# Patient Record
Sex: Male | Born: 1940 | Race: Black or African American | Hispanic: No | Marital: Single | Smoking: Current every day smoker
Health system: Southern US, Community
[De-identification: ages and names within clinical notes are randomized; demographics above are authoritative.]

## PROBLEM LIST (undated history)

## (undated) DIAGNOSIS — C801 Malignant (primary) neoplasm, unspecified: Secondary | ICD-10-CM

## (undated) DIAGNOSIS — I10 Essential (primary) hypertension: Secondary | ICD-10-CM

## (undated) DIAGNOSIS — C3490 Malignant neoplasm of unspecified part of unspecified bronchus or lung: Secondary | ICD-10-CM

## (undated) DIAGNOSIS — Z923 Personal history of irradiation: Secondary | ICD-10-CM

---

## 2001-07-01 ENCOUNTER — Emergency Department (HOSPITAL_COMMUNITY): Admission: EM | Admit: 2001-07-01 | Discharge: 2001-07-02 | Payer: Self-pay | Admitting: Emergency Medicine

## 2001-07-01 ENCOUNTER — Encounter: Payer: Self-pay | Admitting: Emergency Medicine

## 2001-07-12 ENCOUNTER — Encounter: Admission: RE | Admit: 2001-07-12 | Discharge: 2001-07-12 | Payer: Self-pay | Admitting: Internal Medicine

## 2001-07-12 ENCOUNTER — Ambulatory Visit (HOSPITAL_COMMUNITY): Admission: RE | Admit: 2001-07-12 | Discharge: 2001-07-12 | Payer: Self-pay | Admitting: Internal Medicine

## 2004-01-01 ENCOUNTER — Emergency Department (HOSPITAL_COMMUNITY): Admission: EM | Admit: 2004-01-01 | Discharge: 2004-01-01 | Payer: Self-pay | Admitting: Emergency Medicine

## 2005-02-03 ENCOUNTER — Emergency Department (HOSPITAL_COMMUNITY): Admission: EM | Admit: 2005-02-03 | Discharge: 2005-02-03 | Payer: Self-pay | Admitting: Emergency Medicine

## 2009-05-13 ENCOUNTER — Emergency Department (HOSPITAL_COMMUNITY): Admission: EM | Admit: 2009-05-13 | Discharge: 2009-05-13 | Payer: Self-pay | Admitting: Emergency Medicine

## 2011-02-11 ENCOUNTER — Other Ambulatory Visit (HOSPITAL_COMMUNITY): Payer: Self-pay | Admitting: Urology

## 2011-02-11 DIAGNOSIS — C61 Malignant neoplasm of prostate: Secondary | ICD-10-CM

## 2011-02-20 ENCOUNTER — Encounter (HOSPITAL_COMMUNITY)
Admission: RE | Admit: 2011-02-20 | Discharge: 2011-02-20 | Disposition: A | Discharge status: Home / Self Care | Payer: MEDICARE | Source: Ambulatory Visit | Attending: Urology | Admitting: Urology

## 2011-02-20 ENCOUNTER — Ambulatory Visit (HOSPITAL_COMMUNITY): Payer: MEDICARE

## 2011-02-20 ENCOUNTER — Encounter (HOSPITAL_COMMUNITY): Payer: Self-pay

## 2011-02-20 DIAGNOSIS — C61 Malignant neoplasm of prostate: Secondary | ICD-10-CM | POA: Insufficient documentation

## 2011-02-20 HISTORY — DX: Malignant (primary) neoplasm, unspecified: C80.1

## 2011-02-20 MED ORDER — TECHNETIUM TC 99M MEDRONATE IV KIT
25.0000 | PACK | Freq: Once | INTRAVENOUS | Status: AC | PRN
  Administered 2011-02-20: 24 via INTRAVENOUS

## 2011-03-18 ENCOUNTER — Ambulatory Visit: Payer: Medicare Other | Attending: Radiation Oncology | Admitting: Radiation Oncology

## 2011-03-18 DIAGNOSIS — Z510 Encounter for antineoplastic radiation therapy: Secondary | ICD-10-CM | POA: Insufficient documentation

## 2011-03-18 DIAGNOSIS — R300 Dysuria: Secondary | ICD-10-CM | POA: Insufficient documentation

## 2011-03-18 DIAGNOSIS — C61 Malignant neoplasm of prostate: Secondary | ICD-10-CM | POA: Insufficient documentation

## 2011-03-18 DIAGNOSIS — Z79899 Other long term (current) drug therapy: Secondary | ICD-10-CM | POA: Insufficient documentation

## 2011-03-18 DIAGNOSIS — F17200 Nicotine dependence, unspecified, uncomplicated: Secondary | ICD-10-CM | POA: Insufficient documentation

## 2011-03-18 DIAGNOSIS — I10 Essential (primary) hypertension: Secondary | ICD-10-CM | POA: Insufficient documentation

## 2011-03-18 DIAGNOSIS — Y842 Radiological procedure and radiotherapy as the cause of abnormal reaction of the patient, or of later complication, without mention of misadventure at the time of the procedure: Secondary | ICD-10-CM | POA: Insufficient documentation

## 2011-05-18 ENCOUNTER — Ambulatory Visit (HOSPITAL_BASED_OUTPATIENT_CLINIC_OR_DEPARTMENT_OTHER)
Admission: RE | Admit: 2011-05-18 | Discharge: 2011-05-18 | Disposition: A | Discharge status: Home / Self Care | Payer: Medicare Other | Source: Ambulatory Visit | Attending: Urology | Admitting: Urology

## 2011-05-18 ENCOUNTER — Other Ambulatory Visit: Payer: Self-pay | Admitting: Radiation Oncology

## 2011-05-18 ENCOUNTER — Ambulatory Visit (HOSPITAL_COMMUNITY)
Admission: RE | Admit: 2011-05-18 | Discharge: 2011-05-18 | Disposition: A | Discharge status: Home / Self Care | Payer: Medicare Other | Source: Ambulatory Visit | Attending: Radiation Oncology | Admitting: Radiation Oncology

## 2011-05-18 DIAGNOSIS — F17200 Nicotine dependence, unspecified, uncomplicated: Secondary | ICD-10-CM | POA: Insufficient documentation

## 2011-05-18 DIAGNOSIS — Z538 Procedure and treatment not carried out for other reasons: Secondary | ICD-10-CM | POA: Insufficient documentation

## 2011-05-18 DIAGNOSIS — I10 Essential (primary) hypertension: Secondary | ICD-10-CM | POA: Insufficient documentation

## 2011-05-18 DIAGNOSIS — C61 Malignant neoplasm of prostate: Secondary | ICD-10-CM

## 2011-06-01 LAB — COMPREHENSIVE METABOLIC PANEL
ALT: 15 U/L (ref 0–53)
AST: 30 U/L (ref 0–37)
Albumin: 4.1 g/dL (ref 3.5–5.2)
Alkaline Phosphatase: 92 U/L (ref 39–117)
BUN: 8 mg/dL (ref 6–23)
CO2: 26 mEq/L (ref 19–32)
Calcium: 10.2 mg/dL (ref 8.4–10.5)
Chloride: 97 mEq/L (ref 96–112)
Creatinine, Ser: 0.83 mg/dL (ref 0.50–1.35)
GFR calc Af Amer: >60 mL/min (ref >60)
GFR calc non Af Amer: >60 mL/min (ref >60)
Glucose, Bld: 109 mg/dL — ABNORMAL HIGH (ref 70–99)
Potassium: 5.3 mEq/L — ABNORMAL HIGH (ref 3.5–5.1)
Sodium: 135 mEq/L (ref 135–145)
Total Bilirubin: 0.5 mg/dL (ref 0.3–1.2)
Total Protein: 7.7 g/dL (ref 6.0–8.3)

## 2011-06-01 LAB — CBC
HCT: 48.6 % (ref 39.0–52.0)
Hemoglobin: 17.2 g/dL — ABNORMAL HIGH (ref 13.0–17.0)
MCH: 32.9 pg (ref 26.0–34.0)
MCHC: 35.4 g/dL (ref 30.0–36.0)
MCV: 92.9 fL (ref 78.0–100.0)
Platelets: 158 10*3/uL (ref 150–400)
RBC: 5.23 MIL/uL (ref 4.22–5.81)
RDW: 13.2 % (ref 11.5–15.5)
WBC: 4.1 10*3/uL (ref 4.0–10.5)

## 2011-06-01 LAB — PROTIME-INR
INR: 0.97 (ref 0.00–1.49)
Prothrombin Time: 13.1 seconds (ref 11.6–15.2)

## 2011-06-01 LAB — APTT: aPTT: 50 seconds — ABNORMAL HIGH (ref 24–37)

## 2011-06-08 ENCOUNTER — Ambulatory Visit (HOSPITAL_BASED_OUTPATIENT_CLINIC_OR_DEPARTMENT_OTHER)
Admission: RE | Admit: 2011-06-08 | Discharge: 2011-06-08 | Disposition: A | Discharge status: Home / Self Care | Payer: Medicare Other | Source: Ambulatory Visit | Attending: Urology | Admitting: Urology

## 2011-06-08 ENCOUNTER — Ambulatory Visit (HOSPITAL_COMMUNITY): Payer: Medicare Other | Attending: Urology

## 2011-06-08 DIAGNOSIS — Z01810 Encounter for preprocedural cardiovascular examination: Secondary | ICD-10-CM | POA: Insufficient documentation

## 2011-06-08 DIAGNOSIS — C61 Malignant neoplasm of prostate: Secondary | ICD-10-CM | POA: Insufficient documentation

## 2011-06-08 DIAGNOSIS — Z01812 Encounter for preprocedural laboratory examination: Secondary | ICD-10-CM | POA: Insufficient documentation

## 2011-06-17 NOTE — Op Note (Signed)
George [name], George [name]                ACCOUNT NO.:  [id number]  MEDICAL RECORD NO.:  [id number]  LOCATION:                                 FACILITY:  PHYSICIAN:  Azeez Dunker C. Vernie Ammons, M.D.  DATE OF BIRTH:  02/08/1941  DATE OF PROCEDURE: DATE OF DISCHARGE:                              OPERATIVE REPORT   PREOPERATIVE DIAGNOSIS:  Adenocarcinoma of the prostate.  POSTOPERATIVE DIAGNOSIS:  Adenocarcinoma of the prostate.  PROCEDURE: 1. I-125 seed implantation. 2. Cystoscopy.  SURGEON:  Saragrace Selke C. Vernie Ammons, M.D.  ANESTHESIA:  General.  BLOOD LOSS:  Minimal.  DRAINS:  16-French Foley catheter.  SPECIMENS:  None.  COMPLICATIONS:  None.  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  He had high-volume disease, Gleason 3+4 equals 7 and underwent metastatic workup with a CT scan and bone scan, which revealed no evidence of extraprostatic extension, adenopathy or bony metastases. He, therefore, underwent external beam radiation and presents now for radioactive seed boost.  The risks, complications, alternatives and limitations have been discussed and the patient understands and has elected to proceed.  DESCRIPTION OF OPERATION:  After informed consent, the patient was brought to the major OR, placed on table, administered general anesthesia and then moved to the dorsal lithotomy position.  His genitalia and perineum were sterilely prepped and draped.  A rectal tube was placed in the rectum and a 16-French Foley catheter was placed in the bladder and filled with dilute contrast.  The transrectal ultrasound probe was then placed in the rectum and affixed to the stand.  He then underwent real-time sonographic planning using the Spot Pro version 3.1 software.  This allowed a full plan to be devised intraoperatively which was then used for subsequent seed implantation.  Using real-time  ultrasonography and the previously devised plan, 24 needles were used to place 63 seeds in the prostate without difficulty or complication.  I then removed the transrectal ultrasound probe and Foley catheter and proceeded with cystoscopy.  Cystoscopy revealed a normal urethra.  It is brought down to the sphincter which appeared intact.  The prostatic urethra was noted to be free of any abnormalities and no seeds were identified.  Upon entering the bladder, I noted 1+ trabeculation.  The ureteral orifices were identified and noted to be of normal configuration and position.  The bladder was fully inspected and noted to be free of any tumor, stones, or inflammatory lesions.  Also, there were no seeds or spacers seen within the bladder or protruding from the base of the prostate with retroflexion of the scope.  The cystoscope was removed and a new 16-French Foley catheter was placed and connected to close system drainage.  The patient was awakened and taken to recovery room in stable and satisfactory condition.  He tolerated procedure well with no intraoperative complications.  The Foley catheter will be left indwelling and be removed tomorrow by him at home.  He was given written instructions and also a prescription for Vicodin HP #20 and Cipro 500 mg b.i.d. #10.     [name] C.  Vernie Ammons, M.D.     MCO/MEDQ  D:  06/08/2011  T:  06/08/2011  Job:  [id number]  cc:   Billie Lade, Ph.D., M.D. Fax: [phone]  Electronically Signed by Ihor Gully M.D. on 06/17/2011 04:48:26 AM

## 2011-06-30 ENCOUNTER — Ambulatory Visit
Admission: RE | Admit: 2011-06-30 | Discharge: 2011-06-30 | Disposition: A | Discharge status: Home / Self Care | Payer: Medicare Other | Source: Ambulatory Visit | Attending: Radiation Oncology | Admitting: Radiation Oncology

## 2011-06-30 DIAGNOSIS — C61 Malignant neoplasm of prostate: Secondary | ICD-10-CM | POA: Insufficient documentation

## 2011-07-22 ENCOUNTER — Emergency Department (HOSPITAL_COMMUNITY)
Admission: EM | Admit: 2011-07-22 | Discharge: 2011-07-23 | Disposition: A | Discharge status: Home / Self Care | Payer: Medicare Other | Attending: Emergency Medicine | Admitting: Emergency Medicine

## 2011-07-22 DIAGNOSIS — Z8546 Personal history of malignant neoplasm of prostate: Secondary | ICD-10-CM | POA: Insufficient documentation

## 2011-07-22 DIAGNOSIS — R300 Dysuria: Secondary | ICD-10-CM | POA: Insufficient documentation

## 2011-07-22 DIAGNOSIS — I10 Essential (primary) hypertension: Secondary | ICD-10-CM | POA: Insufficient documentation

## 2011-07-22 LAB — CBC
HCT: 46.2 % (ref 39.0–52.0)
Hemoglobin: 16.2 g/dL (ref 13.0–17.0)
MCH: 32.4 pg (ref 26.0–34.0)
MCHC: 35.1 g/dL (ref 30.0–36.0)
MCV: 92.4 fL (ref 78.0–100.0)
RBC: 5 MIL/uL (ref 4.22–5.81)

## 2011-07-22 LAB — BASIC METABOLIC PANEL
BUN: 7 mg/dL (ref 6–23)
CO2: 23 mEq/L (ref 19–32)
Calcium: 9.6 mg/dL (ref 8.4–10.5)
Creatinine, Ser: 0.77 mg/dL (ref 0.50–1.35)
GFR calc non Af Amer: >60 mL/min (ref >60)
Glucose, Bld: 87 mg/dL (ref 70–99)
Sodium: 136 mEq/L (ref 135–145)

## 2011-07-22 LAB — DIFFERENTIAL
Basophils Relative: 1 % (ref 0–1)
Lymphs Abs: 1.5 10*3/uL (ref 0.7–4.0)
Monocytes Absolute: 0.4 10*3/uL (ref 0.1–1.0)
Monocytes Relative: 10 % (ref 3–12)
Neutro Abs: 1.9 10*3/uL (ref 1.7–7.7)
Neutrophils Relative %: 49 % (ref 43–77)

## 2011-07-23 LAB — URINALYSIS, ROUTINE W REFLEX MICROSCOPIC
Bilirubin Urine: NEGATIVE
Glucose, UA: NEGATIVE mg/dL
Ketones, ur: NEGATIVE mg/dL
Protein, ur: NEGATIVE mg/dL
Urobilinogen, UA: 0.2 mg/dL (ref 0.0–1.0)

## 2011-07-23 LAB — URINE MICROSCOPIC-ADD ON

## 2011-07-24 LAB — URINE CULTURE
Colony Count: 7000
Culture  Setup Time: 201208160531

## 2011-10-05 ENCOUNTER — Ambulatory Visit
Admission: RE | Admit: 2011-10-05 | Discharge: 2011-10-05 | Disposition: A | Discharge status: Home / Self Care | Payer: Medicare Other | Source: Ambulatory Visit | Attending: Radiation Oncology | Admitting: Radiation Oncology

## 2011-11-16 ENCOUNTER — Encounter (HOSPITAL_COMMUNITY): Payer: Self-pay | Admitting: *Deleted

## 2011-11-16 ENCOUNTER — Emergency Department (HOSPITAL_COMMUNITY)
Admission: EM | Admit: 2011-11-16 | Discharge: 2011-11-16 | Discharge status: Against Medical Advice | Payer: Medicare Other | Attending: Emergency Medicine | Admitting: Emergency Medicine

## 2011-11-16 DIAGNOSIS — R300 Dysuria: Secondary | ICD-10-CM | POA: Insufficient documentation

## 2012-04-16 ENCOUNTER — Emergency Department (HOSPITAL_COMMUNITY): Payer: Medicare Other

## 2012-04-16 ENCOUNTER — Inpatient Hospital Stay (HOSPITAL_COMMUNITY)
Admission: EM | Admit: 2012-04-16 | Discharge: 2012-04-19 | DRG: 178 | Disposition: A | Discharge status: Home / Self Care | Payer: Medicare Other | Attending: Internal Medicine | Admitting: Internal Medicine

## 2012-04-16 ENCOUNTER — Encounter (HOSPITAL_COMMUNITY): Payer: Self-pay | Admitting: *Deleted

## 2012-04-16 DIAGNOSIS — K089 Disorder of teeth and supporting structures, unspecified: Secondary | ICD-10-CM | POA: Diagnosis present

## 2012-04-16 DIAGNOSIS — F10929 Alcohol use, unspecified with intoxication, unspecified: Secondary | ICD-10-CM | POA: Diagnosis present

## 2012-04-16 DIAGNOSIS — S2239XA Fracture of one rib, unspecified side, initial encounter for closed fracture: Secondary | ICD-10-CM | POA: Diagnosis present

## 2012-04-16 DIAGNOSIS — J189 Pneumonia, unspecified organism: Secondary | ICD-10-CM

## 2012-04-16 DIAGNOSIS — Z8546 Personal history of malignant neoplasm of prostate: Secondary | ICD-10-CM

## 2012-04-16 DIAGNOSIS — F1010 Alcohol abuse, uncomplicated: Secondary | ICD-10-CM

## 2012-04-16 DIAGNOSIS — J690 Pneumonitis due to inhalation of food and vomit: Secondary | ICD-10-CM

## 2012-04-16 DIAGNOSIS — C61 Malignant neoplasm of prostate: Secondary | ICD-10-CM | POA: Diagnosis present

## 2012-04-16 DIAGNOSIS — R109 Unspecified abdominal pain: Secondary | ICD-10-CM | POA: Diagnosis present

## 2012-04-16 DIAGNOSIS — D6959 Other secondary thrombocytopenia: Secondary | ICD-10-CM | POA: Diagnosis present

## 2012-04-16 DIAGNOSIS — T65891A Toxic effect of other specified substances, accidental (unintentional), initial encounter: Secondary | ICD-10-CM | POA: Diagnosis present

## 2012-04-16 DIAGNOSIS — T510X4A Toxic effect of ethanol, undetermined, initial encounter: Secondary | ICD-10-CM | POA: Diagnosis present

## 2012-04-16 DIAGNOSIS — F10229 Alcohol dependence with intoxication, unspecified: Secondary | ICD-10-CM

## 2012-04-16 DIAGNOSIS — Z720 Tobacco use: Secondary | ICD-10-CM | POA: Diagnosis present

## 2012-04-16 DIAGNOSIS — D696 Thrombocytopenia, unspecified: Secondary | ICD-10-CM | POA: Diagnosis present

## 2012-04-16 DIAGNOSIS — W19XXXA Unspecified fall, initial encounter: Secondary | ICD-10-CM | POA: Diagnosis present

## 2012-04-16 DIAGNOSIS — F17200 Nicotine dependence, unspecified, uncomplicated: Secondary | ICD-10-CM

## 2012-04-16 DIAGNOSIS — S2232XA Fracture of one rib, left side, initial encounter for closed fracture: Secondary | ICD-10-CM | POA: Diagnosis present

## 2012-04-16 HISTORY — DX: Essential (primary) hypertension: I10

## 2012-04-16 LAB — CBC
HCT: 45.8 % (ref 39.0–52.0)
MCHC: 34.5 g/dL (ref 30.0–36.0)
Platelets: 53 10*3/uL — ABNORMAL LOW (ref 150–400)
RDW: 16.5 % — ABNORMAL HIGH (ref 11.5–15.5)
WBC: 10.1 10*3/uL (ref 4.0–10.5)

## 2012-04-16 LAB — DIFFERENTIAL
Basophils Absolute: 0 10*3/uL (ref 0.0–0.1)
Eosinophils Absolute: 0 10*3/uL (ref 0.0–0.7)
Lymphocytes Relative: 16 % (ref 12–46)
Lymphs Abs: 1.6 10*3/uL (ref 0.7–4.0)
Neutro Abs: 7.9 10*3/uL — ABNORMAL HIGH (ref 1.7–7.7)

## 2012-04-16 LAB — BASIC METABOLIC PANEL
CO2: 19 mEq/L (ref 19–32)
Chloride: 103 mEq/L (ref 96–112)
Creatinine, Ser: 0.86 mg/dL (ref 0.50–1.35)
GFR calc Af Amer: >90 mL/min (ref >90)
Potassium: 3.5 mEq/L (ref 3.5–5.1)
Sodium: 138 mEq/L (ref 135–145)

## 2012-04-16 LAB — URINALYSIS, ROUTINE W REFLEX MICROSCOPIC
Bilirubin Urine: NEGATIVE
Ketones, ur: NEGATIVE mg/dL
Leukocytes, UA: NEGATIVE
Nitrite: NEGATIVE
Urobilinogen, UA: 1 mg/dL (ref 0.0–1.0)

## 2012-04-16 LAB — D-DIMER, QUANTITATIVE: D-Dimer, Quant: 1.12 ug/mL-FEU — ABNORMAL HIGH (ref 0.00–0.48)

## 2012-04-16 LAB — ETHANOL: Alcohol, Ethyl (B): 235 mg/dL — ABNORMAL HIGH (ref 0–11)

## 2012-04-16 MED ORDER — VANCOMYCIN HCL 1000 MG IV SOLR
750.0000 mg | Freq: Two times a day (BID) | INTRAVENOUS | Status: DC
  Administered 2012-04-16 – 2012-04-18 (×5): 750 mg via INTRAVENOUS
  Filled 2012-04-16 (×5): qty 750

## 2012-04-16 MED ORDER — DEXTROSE 5 % IV SOLN
500.0000 mg | Freq: Once | INTRAVENOUS | Status: AC
  Administered 2012-04-16: 500 mg via INTRAVENOUS
  Filled 2012-04-16: qty 500

## 2012-04-16 MED ORDER — THIAMINE HCL 100 MG/ML IJ SOLN
100.0000 mg | Freq: Every day | INTRAMUSCULAR | Status: DC
  Filled 2012-04-16 (×4): qty 1

## 2012-04-16 MED ORDER — FENTANYL CITRATE 0.05 MG/ML IJ SOLN
50.0000 ug | Freq: Once | INTRAMUSCULAR | Status: AC
  Administered 2012-04-16: 50 ug via INTRAVENOUS
  Filled 2012-04-16: qty 2

## 2012-04-16 MED ORDER — VITAMIN B-1 100 MG PO TABS
100.0000 mg | ORAL_TABLET | Freq: Every day | ORAL | Status: DC
  Administered 2012-04-16 – 2012-04-19 (×4): 100 mg via ORAL
  Filled 2012-04-16 (×4): qty 1

## 2012-04-16 MED ORDER — DEXTROSE 5 % IV SOLN
1.0000 g | Freq: Three times a day (TID) | INTRAVENOUS | Status: DC
  Administered 2012-04-16 – 2012-04-18 (×6): 1 g via INTRAVENOUS
  Filled 2012-04-16 (×7): qty 1

## 2012-04-16 MED ORDER — SODIUM CHLORIDE 0.9 % IV SOLN
INTRAVENOUS | Status: DC
  Administered 2012-04-16 – 2012-04-17 (×2): via INTRAVENOUS

## 2012-04-16 MED ORDER — LEVOFLOXACIN IN D5W 750 MG/150ML IV SOLN
750.0000 mg | INTRAVENOUS | Status: AC
  Administered 2012-04-16 – 2012-04-18 (×3): 750 mg via INTRAVENOUS
  Filled 2012-04-16 (×3): qty 150

## 2012-04-16 MED ORDER — IOHEXOL 300 MG/ML  SOLN
100.0000 mL | Freq: Once | INTRAMUSCULAR | Status: AC | PRN
  Administered 2012-04-16: 100 mL via INTRAVENOUS

## 2012-04-16 MED ORDER — LORAZEPAM 1 MG PO TABS: 1.0000 mg | ORAL_TABLET | Freq: Four times a day (QID) | ORAL | Status: DC | PRN

## 2012-04-16 MED ORDER — FOLIC ACID 1 MG PO TABS
1.0000 mg | ORAL_TABLET | Freq: Every day | ORAL | Status: DC
  Administered 2012-04-16 – 2012-04-19 (×4): 1 mg via ORAL
  Filled 2012-04-16 (×4): qty 1

## 2012-04-16 MED ORDER — DEXTROSE 5 % IV SOLN
1.0000 g | Freq: Once | INTRAVENOUS | Status: AC
  Administered 2012-04-16: 1 g via INTRAVENOUS
  Filled 2012-04-16: qty 10

## 2012-04-16 MED ORDER — LORAZEPAM 2 MG/ML IJ SOLN
1.0000 mg | Freq: Four times a day (QID) | INTRAMUSCULAR | Status: DC | PRN
  Administered 2012-04-16 – 2012-04-17 (×2): 1 mg via INTRAVENOUS
  Filled 2012-04-16 (×2): qty 1

## 2012-04-16 MED ORDER — ADULT MULTIVITAMIN W/MINERALS CH
1.0000 | ORAL_TABLET | Freq: Every day | ORAL | Status: DC
  Administered 2012-04-16 – 2012-04-19 (×4): 1 via ORAL
  Filled 2012-04-16 (×4): qty 1

## 2012-04-16 MED ORDER — SODIUM CHLORIDE 0.9 % IV SOLN
INTRAVENOUS | Status: DC
  Administered 2012-04-16: 250 mL via INTRAVENOUS

## 2012-04-16 NOTE — ED Provider Notes (Addendum)
History     CSN: [id number]  Arrival date & time 04/16/12  0450   First MD Initiated Contact with Patient 04/16/12 [phone]      Chief Complaint  Patient presents with  . Flank Pain    (Consider location/radiation/quality/duration/timing/severity/associated sxs/prior treatment) HPI This is a 71 year old black male with a history of prostate cancer. He called EMS this morning due to the sudden onset of severe left flank pain. EMS found the patient sitting on the toilet trying to have a bowel movement. The patient states his pain as a 10 out of 10 and worse with breathing. He was noted to be hypoxic and was placed on oxygen. He denies dyspnea. He denies nausea or vomiting.  Past Medical History  Diagnosis Date  . Hypertension   . Cancer Prostate    History reviewed. No pertinent past surgical history.  History reviewed. No pertinent family history.  History  Substance Use Topics  . Smoking status: Current Everyday Smoker    Types: Cigarettes  . Smokeless tobacco: Not on file  . Alcohol Use: Yes      Review of Systems  All other systems reviewed and are negative.    Allergies  Review of patient's allergies indicates no known allergies.  Home Medications  No current outpatient prescriptions on file.  BP 125/80  Pulse 94  Temp(Src) 98.2 F (36.8 C) (Oral)  Resp 28  SpO2 92%  Physical Exam General: Well-developed, well-nourished male in no acute distress; appearance consistent with age of record; appears uncomfortable HENT: normocephalic, atraumatic; poor dentition Eyes: pupils equal round and reactive to light; extraocular muscles intact; arcus senilis bilaterally Neck: supple Heart: regular rate and rhythm Lungs: Rales in bases; tachypnea Abdomen: soft; nondistended; nontender; no masses appreciated; bowel sounds present GU: No flank tenderness Extremities: No deformity; full range of motion; pulses normal; trace edema of lower leg Neurologic: Awake, alert;  motor function intact in all extremities and symmetric; no facial droop; hard of hearing Skin: Warm and dry   ED Course  Procedures (including critical care time)     MDM   Nursing notes and vitals signs, including pulse oximetry, reviewed.  Summary of this visit's results, reviewed by myself:  Labs:  Results for orders placed during the hospital encounter of 04/16/12  CBC      Component Value Range   WBC 10.1  4.0 - 10.5 (K/uL)   RBC 4.65  4.22 - 5.81 (MIL/uL)   Hemoglobin 15.8  13.0 - 17.0 (g/dL)   HCT 56.2  13.0 - 86.5 (%)   MCV 98.5  78.0 - 100.0 (fL)   MCH 34.0  26.0 - 34.0 (pg)   MCHC 34.5  30.0 - 36.0 (g/dL)   RDW 78.4 (*) 69.6 - 15.5 (%)   Platelets 53 (*) 150 - 400 (K/uL)  DIFFERENTIAL      Component Value Range   Neutrophils Relative 78 (*) 43 - 77 (%)   Lymphocytes Relative 16  12 - 46 (%)   Monocytes Relative 6  3 - 12 (%)   Eosinophils Relative 0  0 - 5 (%)   Basophils Relative 0  0 - 1 (%)   Neutro Abs 7.9 (*) 1.7 - 7.7 (K/uL)   Lymphs Abs 1.6  0.7 - 4.0 (K/uL)   Monocytes Absolute 0.6  0.1 - 1.0 (K/uL)   Eosinophils Absolute 0.0  0.0 - 0.7 (K/uL)   Basophils Absolute 0.0  0.0 - 0.1 (K/uL)  PRO B NATRIURETIC PEPTIDE  Component Value Range   Pro B Natriuretic peptide (BNP) 92.6  0 - 125 (pg/mL)  TROPONIN I      Component Value Range   Troponin I <0.30  <0.30 (ng/mL)  BASIC METABOLIC PANEL      Component Value Range   Sodium 138  135 - 145 (mEq/L)   Potassium 3.5  3.5 - 5.1 (mEq/L)   Chloride 103  96 - 112 (mEq/L)   CO2 19  19 - 32 (mEq/L)   Glucose, Bld 126 (*) 70 - 99 (mg/dL)   BUN 10  6 - 23 (mg/dL)   Creatinine, Ser 1.61  0.50 - 1.35 (mg/dL)   Calcium 9.2  8.4 - 09.6 (mg/dL)   GFR calc non Af Amer 86 (*) >90 (mL/min)   GFR calc Af Amer >90  >90 (mL/min)  D-DIMER, QUANTITATIVE      Component Value Range   D-Dimer, Quant 1.12 (*) 0.00 - 0.48 (ug/mL-FEU)    Imaging Studies: Dg Chest 2 View  04/16/2012  *RADIOLOGY REPORT*  Clinical  Data: Left-sided flank pain, pain with breathing, shortness of breath, wheezing, and cough.  CHEST - 2 VIEW  Comparison: 05/18/2011  Findings: Normal heart size and pulmonary vascularity. Emphysematous changes with scattered fibrosis in the lungs. Interval development of increased density in the left lung base suggesting developing pneumonia.  No blunting of costophrenic angles.  IMPRESSION: Interval development of airspace infiltration in the left lung base suggesting pneumonia.  Underlying emphysematous changes are stable.  Original Report Authenticated By: Marlon Pel, M.D.   EKG Interpretation:  Date & Time: 04/16/2012 5:03 AM  Rate: 98  Rhythm: normal sinus rhythm  QRS Axis: normal  Intervals: normal  ST/T Wave abnormalities: normal  Conduction Disutrbances:none  Narrative Interpretation: RSR' on V1  Old EKG Reviewed: unchanged           Hanley Seamen, MD 04/16/12 0454  Hanley Seamen, MD 04/16/12 [phone]

## 2012-04-16 NOTE — Progress Notes (Signed)
ANTIBIOTIC CONSULT NOTE - INITIAL  Pharmacy Consult for Vancomycin Indication: PNA  No Known Allergies  Patient Measurements:    Vital Signs: Temp: 99.2 F (37.3 C) (05/11 1407) Temp src: Oral (05/11 1407) BP: 138/71 mmHg (05/11 1407) Pulse Rate: 108  (05/11 1407) Intake/Output from previous day:   Intake/Output from this shift: Total I/O In: -  Out: 140 [Urine:140]  Labs:  [facility] 04/16/12 0516 04/16/12 0515  WBC -- 10.1  HGB -- 15.8  PLT -- 53*  LABCREA -- --  CREATININE 0.86 --   CrCl is unknown because there is no height on file for the current visit. No results found for this basename: VANCOTROUGH:2,VANCOPEAK:2,VANCORANDOM:2,GENTTROUGH:2,GENTPEAK:2,GENTRANDOM:2,TOBRATROUGH:2,TOBRAPEAK:2,TOBRARND:2,AMIKACINPEAK:2,AMIKACINTROU:2,AMIKACIN:2, in the last 72 hours   Microbiology: No results found for this or any previous visit (from the past 720 hour(s)).  Medical History: Past Medical History  Diagnosis Date  . Hypertension   . Cancer Prostate    Medications:  Scheduled:    . azithromycin  500 mg Intravenous Once  . ceFEPime (MAXIPIME) IV  1 g Intravenous Q8H  . cefTRIAXone (ROCEPHIN)  IV  1 g Intravenous Once  . fentaNYL  50 mcg Intravenous Once  . folic acid  1 mg Oral Daily  . levofloxacin (LEVAQUIN) IV  750 mg Intravenous Q24H  . mulitivitamin with minerals  1 tablet Oral Daily  . thiamine  100 mg Oral Daily   Or  . thiamine  100 mg Intravenous Daily   Infusions:    . sodium chloride 125 mL/hr at 04/16/12 0603  . sodium chloride     PRN: iohexol, LORazepam, LORazepam Assessment: Vancomycin to be started for PNA in a 71 yo male pt. Pt on concurrent Cefepime and Levaquin  Goal of Therapy:  Vancomycin trough level 15-20 mcg/ml  Plan:  1. Vancomycin 750mg  IV Q12h x 8 days 2. Will f/u Scr and check trough level at steady state or if renal function changes.  Dorethea Clan 04/16/2012,4:16 PM

## 2012-04-16 NOTE — ED Notes (Signed)
EMS called to home.  Found patient sitting on toilet having a bowel movement. Patient complaining of left flank pain.  Currently patient rates his pain 10 of 10.

## 2012-04-16 NOTE — H&P (Signed)
PCP:   Patient has been to be [facility] in the past  Chief Complaint:  Left flank pain  HPI: Patient is a 71 year old African American male past medical history of alcohol abuse, tobacco abuse and likely undiagnosed COPD who is coming to the emergency room for sudden onset of left flank pain. It was found out later history that he intermittently drinks heavily and that either fallen (although he has no recollection) or passed out and at that time had injured himself leading to some posterior rib fractures.  In the emergency room, a chest x-ray noted rib fractures and a mild pneumonia. In addition, the patient had a mildly elevated blood alcohol level. Because of persistence of pain, there was a concern for possibility of a pulmonary embolus the CT scan noted no pulmonary embolus, but did not multilobar pneumonia consistent with aspiration pneumonia. Hospitalists were called for admission.  Review of Systems:  When I saw the patient emergency room, he was doing okay. His main complaint was of feeling fatigued. Pain was better. He denied any acute shortness of breath with oxygen. He denied any headaches, vision changes, dysphasia, chest pain, palpitations, shortness of breath, wheeze, cough, abdominal pain, hematuria, dysuria, constipation, diarrhea, focal extremity numbness or weakness or pain. His review systems otherwise negative.  Past Medical History: Past Medical History  Diagnosis Date  . Hypertension   . Cancer Prostate   the patient himself denies any history of hypertension. History reviewed. No pertinent past surgical history.  Medications: Prior to Admission medications   Not on File  He says he does not take any medications.  Allergies:  No Known Allergies  Social History:  reports that he has been smoking Cigarettes.  He does not have any smokeless tobacco history on file. He reports that he drinks alcohol. He reports that he does not use illicit drugs. The  patient is normally at baseline able to participate in most activities of daily living. He lives in a boarding house by himself. He does have some family in the area..  Family History: Brother with diabetes  Physical Exam: Filed Vitals:   04/16/12 0500 04/16/12 0504 04/16/12 1010 04/16/12 1407  BP:  125/80 118/63 138/71  Pulse: 100 94 102 108  Temp:  98.2 F (36.8 C)  99.2 F (37.3 C)  TempSrc:  Oral  Oral  Resp: 28 28 16 18   SpO2: 87% 92% 96% 97%   General: Alert and oriented x3, fatigue, looks about stated age, no acute distress HEENT: Normocephalic, atraumatic, mucous membranes are slightly dry Cardiovascular: Regular rate and rhythm, S1-S2 Lungs: Decreased breath sounds throughout with some mild wheezing and basilar crackles at the left base Abdomen: Soft, nontender, nondistended, positive bowel sounds Extremities: No clubbing or cyanosis or edema   Labs on Admission:   [facility] 04/16/12 0516  NA 138  K 3.5  CL 103  CO2 19  GLUCOSE 126*  BUN 10  CREATININE 0.86  CALCIUM 9.2  MG --  PHOS --    Basename 04/16/12 0515  WBC 10.1  NEUTROABS 7.9*  HGB 15.8  HCT 45.8  MCV 98.5  PLT 53*    Basename 04/16/12 0516  CKTOTAL --  CKMB --  CKMBINDEX --  TROPONINI <0.30    Radiological Exams on Admission: Dg Chest 2 View 04/16/2012  .  IMPRESSION: Interval development of airspace infiltration in the left lung base suggesting pneumonia.  Underlying emphysematous changes are stable.  Original Report Authenticated By: Marlon Pel, M.D.  Ct Angio Chest W/cm &/or Wo Cm 04/16/2012    IMPRESSION:  1.   No evidence for acute pulmonary embolus. 2.  Left lower lobe airspace consolidation consistent with pneumonia and/or aspiration. 3.  Subacute left posterior rib fractures.  Original Report .    Assessment/Plan Present on Admission:  .Alcohol abuse: Council patient. Use multivitamin plus thiamine plus folate. Watch for alcohol withdrawal.  .Alcohol intoxication:  Should resolve soon. See above.  Reported history of high blood pressure: Patient does not recall this diagnosis. We'll continue to monitor. Start on medications accordingly.  .Aspiration pneumonia: Will cover with broad-spectrum antibiotics plus oxygen and nebulizers. Patient stable.  .Tobacco abuse: Council. Patient declined nicotine patch.  .Poor dentition: Noted.  After discussion with the patient, he is to be a full code.  We will respect these wishes.  I anticipate his length of stay to be 2-3 days based on severity of pneumonia, unless something should change.  Time spent on this patient including examination and decision-making process: 45 minutes.  Hollice Espy [phone] 04/16/2012, 2:08 PM

## 2012-04-16 NOTE — ED Notes (Signed)
Pt unable to provide urine

## 2012-04-17 DIAGNOSIS — D696 Thrombocytopenia, unspecified: Secondary | ICD-10-CM | POA: Diagnosis present

## 2012-04-17 DIAGNOSIS — S2239XA Fracture of one rib, unspecified side, initial encounter for closed fracture: Secondary | ICD-10-CM

## 2012-04-17 DIAGNOSIS — J690 Pneumonitis due to inhalation of food and vomit: Secondary | ICD-10-CM

## 2012-04-17 DIAGNOSIS — D6959 Other secondary thrombocytopenia: Secondary | ICD-10-CM

## 2012-04-17 DIAGNOSIS — S2232XA Fracture of one rib, left side, initial encounter for closed fracture: Secondary | ICD-10-CM | POA: Diagnosis present

## 2012-04-17 DIAGNOSIS — F1010 Alcohol abuse, uncomplicated: Secondary | ICD-10-CM

## 2012-04-17 LAB — BASIC METABOLIC PANEL
CO2: 26 mEq/L (ref 19–32)
Calcium: 8.5 mg/dL (ref 8.4–10.5)
Creatinine, Ser: 1.05 mg/dL (ref 0.50–1.35)
Glucose, Bld: 97 mg/dL (ref 70–99)

## 2012-04-17 LAB — CBC
Hemoglobin: 13.1 g/dL (ref 13.0–17.0)
MCH: 32.7 pg (ref 26.0–34.0)
MCHC: 33.5 g/dL (ref 30.0–36.0)
MCV: 97.5 fL (ref 78.0–100.0)
Platelets: 56 10*3/uL — ABNORMAL LOW (ref 150–400)
RBC: 4.01 MIL/uL — ABNORMAL LOW (ref 4.22–5.81)

## 2012-04-17 LAB — HIV ANTIBODY (ROUTINE TESTING W REFLEX): HIV: NONREACTIVE

## 2012-04-17 NOTE — Progress Notes (Signed)
1600: pt ambulated in hallway on room air. Pt able to walk with assistance for his room (1510) to nurses' station. Pt on continuous pulse ox.. Reading 87% with heart rate 113 during ambulation. Pt tolerated walk well. No complaints of dyspnea, pain or dizziness.  Wynonia Lawman, RN

## 2012-04-17 NOTE — Progress Notes (Signed)
Subjective: Patient feeling better today. No shortness of breath or cough. Minimal if any pain.  Objective: Weight change:   Intake/Output Summary (Last 24 hours) at 04/17/12 1539 Last data filed at 04/17/12 1500  Gross per 24 hour  Intake    550 ml  Output    275 ml  Net    275 ml    Filed Vitals:   04/17/12 1510  BP: 112/60  Pulse: 105  Temp: 99.3 F (37.4 C)  Resp: 18   General: Alert and oriented x3, no acute distress fatigued, looks older than stated age HEENT: Normocephalic, atraumatic, mucous membranes are moist, poor dentition Cardiovascular: Regular rate and rhythm, S1-S2 Lungs: Clear to auscultation bilaterally Abdomen: Soft, nontender, nondistended, positive bowel sounds Extremities: No clubbing or cyanosis or edema  Lab Results: Basic Metabolic Panel:  Basename 04/17/12 0545 04/16/12 0516  NA 139 138  K 3.7 3.5  CL 106 103  CO2 26 19  GLUCOSE 97 126*  BUN 14 10  CREATININE 1.05 0.86  CALCIUM 8.5 9.2  MG -- --  PHOS -- --   CBC:  Basename 04/17/12 0545 04/16/12 0515  WBC 7.8 10.1  NEUTROABS -- 7.9*  HGB 13.1 15.8  HCT 39.1 45.8  MCV 97.5 98.5  PLT 56* 53*    Medications: Scheduled Meds:   . ceFEPime (MAXIPIME) IV  1 g Intravenous Q8H  . folic acid  1 mg Oral Daily  . levofloxacin (LEVAQUIN) IV  750 mg Intravenous Q24H  . mulitivitamin with minerals  1 tablet Oral Daily  . thiamine  100 mg Oral Daily   Or  . thiamine  100 mg Intravenous Daily  . vancomycin  750 mg Intravenous Q12H   Continuous Infusions:   . sodium chloride 125 mL/hr at 04/16/12 0603  . sodium chloride 250 mL (04/16/12 1600)   PRN Meds:.LORazepam, LORazepam  Assessment/Plan: Patient Active Hospital Problem List: Aspiration pneumonia (04/16/2012)  continue broad-spectrum antibiotics. Patient is doing well. We'll ambulate and check oxygen saturations.  Alcohol abuse (04/16/2012)  continue vitamin supplementation. Watching for alcohol pro.  Alcohol intoxication  (04/16/2012)  resolved.  Tobacco abuse (04/16/2012)  stable. Declined nicotine patch. Provide counseling.  Posterior rib fractures: Likely from fall secondary to alcohol intoxication. Stable.  History of prostate cancer (04/16/2012)  stable.  Poor dentition (04/16/2012)  stable.  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    LOS: 1 day   George [name] 04/17/2012, 3:39 PM

## 2012-04-18 DIAGNOSIS — J690 Pneumonitis due to inhalation of food and vomit: Secondary | ICD-10-CM

## 2012-04-18 DIAGNOSIS — D6959 Other secondary thrombocytopenia: Secondary | ICD-10-CM

## 2012-04-18 DIAGNOSIS — F1010 Alcohol abuse, uncomplicated: Secondary | ICD-10-CM

## 2012-04-18 DIAGNOSIS — S2239XA Fracture of one rib, unspecified side, initial encounter for closed fracture: Secondary | ICD-10-CM

## 2012-04-18 LAB — LEGIONELLA ANTIGEN, URINE: Legionella Antigen, Urine: NEGATIVE

## 2012-04-18 LAB — VANCOMYCIN, TROUGH: Vancomycin Tr: 12.2 ug/mL (ref 10.0–20.0)

## 2012-04-18 MED ORDER — VANCOMYCIN HCL IN DEXTROSE 1-5 GM/200ML-% IV SOLN
1000.0000 mg | Freq: Two times a day (BID) | INTRAVENOUS | Status: DC
  Administered 2012-04-19: 1000 mg via INTRAVENOUS
  Filled 2012-04-18 (×3): qty 200

## 2012-04-18 MED ORDER — DEXTROSE 5 % IV SOLN
1.0000 g | Freq: Two times a day (BID) | INTRAVENOUS | Status: DC
  Administered 2012-04-18 – 2012-04-19 (×2): 1 g via INTRAVENOUS
  Filled 2012-04-18 (×3): qty 1

## 2012-04-18 NOTE — Progress Notes (Addendum)
ANTIBIOTIC CONSULT NOTE - follow up  Pharmacy Consult for Vancomycin/Cefepime/Levaquin Indication: aspiration PNA  No Known Allergies  Patient Measurements: Height: 5' 6.5" (168.9 cm) Weight: 118 lb (53.524 kg) IBW/kg (Calculated) : 64.95   Vital Signs: Temp: 99.4 F (37.4 C) (05/12 2200) Temp src: Oral (05/12 2200) BP: 137/72 mmHg (05/12 2200) Pulse Rate: 85  (05/12 2200) Intake/Output from previous day: 05/12 0701 - 05/13 0700 In: 850 [P.O.:600; IV Piggyback:250] Out: 725 [Urine:725] Intake/Output from this shift:    Labs:  Basename 04/17/12 0545 04/16/12 0516 04/16/12 0515  WBC 7.8 -- 10.1  HGB 13.1 -- 15.8  PLT 56* -- 53*  LABCREA -- -- --  CREATININE 1.05 0.86 --   Estimated Creatinine Clearance: 50.2 ml/min (by C-G formula based on Cr of 1.05). No results found for this basename: VANCOTROUGH:2,VANCOPEAK:2,VANCORANDOM:2,GENTTROUGH:2,GENTPEAK:2,GENTRANDOM:2,TOBRATROUGH:2,TOBRAPEAK:2,TOBRARND:2,AMIKACINPEAK:2,AMIKACINTROU:2,AMIKACIN:2, in the last 72 hours   Microbiology: Recent Results (from the past 720 hour(s))  CULTURE, BLOOD (ROUTINE X 2)     Status: Normal (Preliminary result)   Collection Time   04/16/12  4:30 PM      Component Value Range Status Comment   Specimen Description BLOOD RIGHT HAND   Final    Special Requests     Final    Value: BOTTLES DRAWN AEROBIC AND ANAEROBIC 10CC BOTH BOTTLES   Culture  Setup Time [id number]   Final    Culture     Final    Value:        BLOOD CULTURE RECEIVED NO GROWTH TO DATE CULTURE WILL BE HELD FOR 5 DAYS BEFORE ISSUING A FINAL NEGATIVE REPORT   Report Status PENDING   Incomplete   CULTURE, BLOOD (ROUTINE X 2)     Status: Normal (Preliminary result)   Collection Time   04/16/12  4:30 PM      Component Value Range Status Comment   Specimen Description BLOOD RIGHT ARM   Final    Special Requests     Final    Value: BOTTLES DRAWN AEROBIC AND ANAEROBIC 2.5CCBOTH BOTTLES   Culture  Setup Time [id number]   Final    Culture     Final    Value:        BLOOD CULTURE RECEIVED NO GROWTH TO DATE CULTURE WILL BE HELD FOR 5 DAYS BEFORE ISSUING A FINAL NEGATIVE REPORT   Report Status PENDING   Incomplete     Medical History: Past Medical History  Diagnosis Date  . Hypertension   . Cancer Prostate    Medications:  Scheduled:     . ceFEPime (MAXIPIME) IV  1 g Intravenous Q8H  . folic acid  1 mg Oral Daily  . levofloxacin (LEVAQUIN) IV  750 mg Intravenous Q24H  . mulitivitamin with minerals  1 tablet Oral Daily  . thiamine  100 mg Oral Daily   Or  . thiamine  100 mg Intravenous Daily  . vancomycin  750 mg Intravenous Q12H   Infusions:     . sodium chloride 125 mL/hr at 04/17/12 1908  . sodium chloride 250 mL (04/16/12 1600)   PRN: LORazepam, LORazepam  Assessment: Day 3/8 Vanc and Cefepime and Day 3/3 Levaquin for aspiration PNA.  Goal of Therapy:  Vancomycin trough level 15-20 mcg/ml  Plan:  1. Continue vancomycin 750mg  IV Q12h for now 2. Will check a vanc trough today prior to next dose 3. Will change Cefepime to 1g IV q12 for CrCl 30-60 ml/min 4. Continue Levaquin 750mg  IV q24   Hessie Knows, PharmD, BCPS Pager  [phone] 04/18/2012 7:42 AM    ADDENDUM: 04/18/2012 6:13 PM A: Vancomycin trough 12.2 on 750 mg IV q12h dosing P: Adjust vancomycin to 1g IV q12h.  Continue to watch SCr for further dose adjustments.  F/u narrowing of abx.  Clance Boll, PharmD, BCPS Pager: [phone]

## 2012-04-18 NOTE — Progress Notes (Signed)
Subjective: Patient feeling a little bit better. Still a little weak.  Objective: Weight change:   Intake/Output Summary (Last 24 hours) at 04/18/12 2232 Last data filed at 04/18/12 1809  Gross per 24 hour  Intake    680 ml  Output   2200 ml  Net  -1520 ml    Filed Vitals:   04/18/12 1403  BP: 128/61  Pulse: 70  Temp: 98.4 F (36.9 C)  Resp: 20   General: Alert and oriented x3, no acute distress fatigued, looks older than stated age HEENT: Normocephalic, atraumatic, mucous membranes are moist, poor dentition Cardiovascular: Regular rate and rhythm, S1-S2 Lungs: Clear to auscultation bilaterally Abdomen: Soft, nontender, nondistended, positive bowel sounds Extremities: No clubbing or cyanosis or edema  Lab Results: Basic Metabolic Panel:  Basename 04/17/12 0545 04/16/12 0516  NA 139 138  K 3.7 3.5  CL 106 103  CO2 26 19  GLUCOSE 97 126*  BUN 14 10  CREATININE 1.05 0.86  CALCIUM 8.5 9.2  MG -- --  PHOS -- --   CBC:  Basename 04/17/12 0545 04/16/12 0515  WBC 7.8 10.1  NEUTROABS -- 7.9*  HGB 13.1 15.8  HCT 39.1 45.8  MCV 97.5 98.5  PLT 56* 53*    Medications: Scheduled Meds:    . ceFEPime (MAXIPIME) IV  1 g Intravenous Q12H  . folic acid  1 mg Oral Daily  . levofloxacin (LEVAQUIN) IV  750 mg Intravenous Q24H  . mulitivitamin with minerals  1 tablet Oral Daily  . thiamine  100 mg Oral Daily   Or  . thiamine  100 mg Intravenous Daily  . vancomycin  1,000 mg Intravenous Q12H  . DISCONTD: ceFEPime (MAXIPIME) IV  1 g Intravenous Q8H  . DISCONTD: vancomycin  750 mg Intravenous Q12H   Continuous Infusions:    . sodium chloride 250 mL (04/16/12 1600)  . DISCONTD: sodium chloride 125 mL/hr at 04/17/12 1908   PRN Meds:.LORazepam, LORazepam  Assessment/Plan: Patient Active Hospital Problem List: Aspiration pneumonia (04/16/2012)  continue broad-spectrum antibiotics. Patient is doing well. Ambulatory attempts led to minor oxygen desaturations but this  is slowly continued to improve. Suspect he'll be ready for discharge tomorrow without home oxygen.  Alcohol abuse (04/16/2012)  continue vitamin supplementation. Fortunately no signs of any alcohol withdrawal.  Alcohol intoxication (04/16/2012)  resolved.  Tobacco abuse (04/16/2012)  stable. Declined nicotine patch. Provide counseling.  Posterior rib fractures: Likely from fall secondary to alcohol intoxication. Stable.  History of prostate cancer (04/16/2012)  stable.  Poor dentition (04/16/2012)  stable.  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    LOS: 2 days   George [name] 04/18/2012, 10:32 PM

## 2012-04-18 NOTE — Progress Notes (Signed)
At approximately 1645, the pt was walked down the hall with minimal assist while monitoring SpO2. O2 Saturation dropped to 86% after approximately 43ft of ambulation, yet returned to 91% in under 1 minute. Pt was asymptomatic and tolerated this well.  Claudette Head, RN

## 2012-04-19 DIAGNOSIS — D6959 Other secondary thrombocytopenia: Secondary | ICD-10-CM

## 2012-04-19 DIAGNOSIS — F1010 Alcohol abuse, uncomplicated: Secondary | ICD-10-CM

## 2012-04-19 DIAGNOSIS — S2239XA Fracture of one rib, unspecified side, initial encounter for closed fracture: Secondary | ICD-10-CM

## 2012-04-19 DIAGNOSIS — J690 Pneumonitis due to inhalation of food and vomit: Secondary | ICD-10-CM

## 2012-04-19 MED ORDER — FOLIC ACID 1 MG PO TABS: 1.0000 mg | ORAL_TABLET | Freq: Every day | ORAL | Status: AC

## 2012-04-19 MED ORDER — ADULT MULTIVITAMIN W/MINERALS CH: 1.0000 | ORAL_TABLET | Freq: Every day | ORAL | Status: DC

## 2012-04-19 MED ORDER — THIAMINE HCL 100 MG PO TABS: 100.0000 mg | ORAL_TABLET | Freq: Every day | ORAL | Status: AC

## 2012-04-19 MED ORDER — LEVOFLOXACIN 750 MG PO TABS: 750.0000 mg | ORAL_TABLET | Freq: Every day | ORAL | Status: AC

## 2012-04-19 NOTE — Discharge Summary (Signed)
DISCHARGE SUMMARY  George [name]  MR#: [id number]  DOB:06-12-1941  Date of Admission: 04/16/2012 Date of Discharge: 04/19/2012  Attending Physician:Zephyra Bernardi K  Patient's PCP: Patient has no primary care provider, he has in the past followed up with the [facility] and was advised to follow up there again.  Consults: -none   Discharge Diagnoses: Present on Admission:  .Alcohol abuse .Alcohol intoxication .Aspiration pneumonia .Tobacco abuse .Poor dentition .Thrombocytopenia, acquired .Rib fractures, left, closed, initial encounter   Initial presentation: Patient is a 71 year old Afro-American male past medical history of alcohol and tobacco abuse who presented complaining of pain and was found to be a acutely alcohol intoxicated as well as found to have a pneumonia. In review of the story was felt that likely he had passed out, he sustained rib fractures and developed an aspiration pneumonia. He was admitted to the hospitalist service he was noted to be mildly hypoxic.   Hospital Course: Patient Active Problem List  Diagnoses  . Alcohol abuse: Patient was counseled for his drinking. He was monitored for alcohol withdrawal, but this did not occur. He was provided vitamin supplementation.   . Alcohol intoxication: Initial alcohol levels were elevated. He has since been sober.   . Aspiration pneumonia: Patient was started on broad-spectrum IV antibiotics consistent with coverage for aspiration pneumonia. His symptoms improved and by day of discharge, he was able to ambulate in the hallway without oxygen saturations 92% on room air. His antibiotics will be changed over to by mouth Levaquin and he will be discharged home with 7 more days of by mouth antibiotics.   . Tobacco abuse: Patient was counseled on smoking. He declined nicotine patch.   . History of prostate cancer: Stable.   Marland Kitchen Poor dentition : Stable.   . Thrombocytopenia, acquired: Stable. No  evidence of active bleeding. Likely from previous long alcohol use.   . Rib fractures, left, closed, initial encounter: Likely from acute alcohol intoxication passing out. His pain was minimal from this.   :   Medication List  As of 04/19/2012  2:11 PM   TAKE these medications         folic acid 1 MG tablet   Commonly known as: FOLVITE   Take 1 tablet (1 mg total) by mouth daily.      levofloxacin 750 MG tablet   Commonly known as: LEVAQUIN   Take 1 tablet (750 mg total) by mouth daily.   Start taking on: 04/25/2012      mulitivitamin with minerals Tabs   Take 1 tablet by mouth daily.      thiamine 100 MG tablet   Take 1 tablet (100 mg total) by mouth daily.             Day of Discharge BP 156/84  Pulse 70  Temp(Src) 98.4 F (36.9 C) (Oral)  Resp 18  Ht 5' 6.5" (1.689 m)  Wt 53.524 kg (118 lb)  BMI 18.76 kg/m2  SpO2 95%  Physical Exam: General: Alert and oriented x3, no acute distress HEENT: Normocephalic and atraumatic, mucous members are moist, poor dentition, looks older than stated age Cardiovascular: Regular rate and rhythm, S1-S2 Lungs: Clear to auscultation bilaterally Abdomen: Soft, nontender, nondistended, positive bowel sounds Extremities: No clubbing or cyanosis or edema  Results for orders placed during the hospital encounter of 04/16/12 (from the past 24 hour(s))  VANCOMYCIN, TROUGH     Status: Normal   Collection Time   04/18/12  5:03 PM  Component Value Range   Vancomycin Tr 12.2  10.0 - 20.0 (ug/mL)    Disposition: Stable. Being discharged home   Follow-up Appts: Discharge Orders    Future Orders Please Complete By Expires   Diet - low sodium heart healthy      Increase activity slowly         Follow-up Information    Follow up with [facility] in 1 month.         Tests Needing Follow-up: None  Time spent in discharge (includes decision making & examination of pt): 35  minutes  Signed: Hollice Espy 04/19/2012, 2:11 PM  He was

## 2012-04-19 NOTE — Discharge Instructions (Signed)
Aspiration Pneumonia Aspiration pneumonia is an infection in your lungs. It occurs when you breathe (aspirate) things into your lungs such as food, vomit, or liquid. When these things get into your lungs, swelling (inflammation) can occur. This can make it difficult for you to breath. Aspiration pneumonia is a serious condition and can be life threatening.  CAUSES  Aspiration pneumonia can have many causes. Some of the causes include:  Having a brain injury or disease:   Stroke.   Seizures.   Confusion (Dementia).   ALS (Amyotrophic Lateral Sclerosis, or Lou Gehrig's disease).   Parkinson's disease.   Other causes of aspiration pneumonia include:   Being under general anesthesia for procedures.   Being in a coma (unconscious). This unconscious state can be caused by medicine, illegal drugs, injury or disease. Being in a coma can decrease a person's cough (gag) reflex. Aspiration can occur when a person is not awake enough to cough if something goes into the lungs.   A narrowing of the esophagus (the tube that carries food to the stomach).   Having dental problems that makes it hard to swallow.   Drinking too much alcohol. If a person passes out and vomits, vomit can be swallowed into the lungs.   Taking certain medications. Tranquilizers and sedatives can sometimes decrease your swallowing or gag reflex.  SYMPTOMS   Coughing after swallowing food or liquids.   Breathing problems. These could include wheezing (a whistling sound) or shortness of breath.   Bluish skin. This can be caused by lack of oxygen.   Coughing up food or mucus. The mucus might contain blood, pus or greenish material.   Fever.   Chest pain.   Fatigue (being more tired than usual).   Sweating more than usual.   Bad breath.  DIAGNOSIS  A physical examination and testing will be needed to see if aspiration pneumonia is present. This can include:  A review of the above symptoms.   Chest X-ray.  This is a picture of the lungs.   Listening to your lungs with a stethoscope. Your healthcare provider will listen for:   Crackling sounds in the lungs.   Decreased breath sounds.   A rapid heartbeat.   Swallowing study. This test looks at how food is swallowed and whether it goes into your breathing tube (trachea) or food pipe (esophagus).   Sputum culture. Sputum (saliva and mucus) is collected from the lungs or bronchi (tubes that carry air to the lungs). It is then tested for bacteria.   Computed tomography. This is called a CT scan. It also can show lung damage.   Bronchoscopy. This test uses a flexible tube (bronchoscope) to see inside the lungs.  TREATMENT  Treatment will depend on how severe the aspiration pneumonia is and what led to it.  Some people may need to be treated in the hospital. Your breathing will be carefully monitored. Depending on how well you are breathing, you may:   Be able to breath on your own but need oxygen.   Need breathing support via a breathing machine (ventilator).   Medication:   Antibiotics or anti-fungal drugs might be prescribed. The particular medicine will depend on what caused the infection.   Other drugs may be given to reduce fever and/or pain.   Other treatments or corrections may be needed. For example:   Following a recommended diet. This is especially important if the swallowing study was failed.   Revising medications.   Fixing dental problems.     Correcting breathing obstructions.   Treating stomach disorders.   Dealing with alcohol issues.   Having a feeding tube placed in the stomach.  HOME CARE INSTRUCTIONS   Take any medicines that were prescribed. Follow the directions carefully.   Check with your caregiver before taking over-the-counter medications.   Rest as instructed by your caregiver.   Keep all follow-up appointments with your healthcare provider. This is important so the caregiver can make sure the  pneumonia is gone.  SEEK MEDICAL CARE IF:  Any of these symptoms return:  Shortness of breath or difficulty breathing.   Wheezing.   Fever of more than 100.5 F (38.1 C) or as recommended by your caregiver.   Chest pain.  MAKE SURE YOU:   Understand these instructions.   Will watch your condition.   Will get help right away if you are not doing well or get worse.  Document Released: 09/20/2009 Document Revised: 11/12/2011 Document Reviewed: 09/20/2009 [employer] Patient Information 2012 [employer]. 

## 2012-04-19 NOTE — Progress Notes (Signed)
Voices understanding of d/c instructions.  No changes noted since am assessment.   

## 2012-04-22 LAB — CULTURE, BLOOD (ROUTINE X 2): Culture: NO GROWTH

## 2013-11-06 ENCOUNTER — Encounter (HOSPITAL_COMMUNITY): Payer: Self-pay | Admitting: Emergency Medicine

## 2013-11-06 ENCOUNTER — Emergency Department (HOSPITAL_COMMUNITY)
Admission: EM | Admit: 2013-11-06 | Discharge: 2013-11-06 | Disposition: A | Discharge status: Home / Self Care | Payer: Medicare Other | Attending: Emergency Medicine | Admitting: Emergency Medicine

## 2013-11-06 ENCOUNTER — Emergency Department (HOSPITAL_COMMUNITY): Payer: Medicare Other

## 2013-11-06 DIAGNOSIS — Y939 Activity, unspecified: Secondary | ICD-10-CM | POA: Insufficient documentation

## 2013-11-06 DIAGNOSIS — F1010 Alcohol abuse, uncomplicated: Secondary | ICD-10-CM | POA: Insufficient documentation

## 2013-11-06 DIAGNOSIS — W010XXA Fall on same level from slipping, tripping and stumbling without subsequent striking against object, initial encounter: Secondary | ICD-10-CM | POA: Insufficient documentation

## 2013-11-06 DIAGNOSIS — I10 Essential (primary) hypertension: Secondary | ICD-10-CM | POA: Insufficient documentation

## 2013-11-06 DIAGNOSIS — Z859 Personal history of malignant neoplasm, unspecified: Secondary | ICD-10-CM | POA: Insufficient documentation

## 2013-11-06 DIAGNOSIS — Y92009 Unspecified place in unspecified non-institutional (private) residence as the place of occurrence of the external cause: Secondary | ICD-10-CM | POA: Insufficient documentation

## 2013-11-06 DIAGNOSIS — S42213A Unspecified displaced fracture of surgical neck of unspecified humerus, initial encounter for closed fracture: Secondary | ICD-10-CM | POA: Insufficient documentation

## 2013-11-06 DIAGNOSIS — F17200 Nicotine dependence, unspecified, uncomplicated: Secondary | ICD-10-CM | POA: Insufficient documentation

## 2013-11-06 DIAGNOSIS — S42209A Unspecified fracture of upper end of unspecified humerus, initial encounter for closed fracture: Secondary | ICD-10-CM | POA: Insufficient documentation

## 2013-11-06 DIAGNOSIS — S42301A Unspecified fracture of shaft of humerus, right arm, initial encounter for closed fracture: Secondary | ICD-10-CM

## 2013-11-06 LAB — CBC WITH DIFFERENTIAL/PLATELET
Basophils Absolute: 0 10*3/uL (ref 0.0–0.1)
Basophils Relative: 1 % (ref 0–1)
Eosinophils Absolute: 0 10*3/uL (ref 0.0–0.7)
Eosinophils Relative: 0 % (ref 0–5)
HCT: 38.8 % — ABNORMAL LOW (ref 39.0–52.0)
Hemoglobin: 13.5 g/dL (ref 13.0–17.0)
Lymphocytes Relative: 15 % (ref 12–46)
Lymphs Abs: 1.2 10*3/uL (ref 0.7–4.0)
MCH: 33.2 pg (ref 26.0–34.0)
MCHC: 34.8 g/dL (ref 30.0–36.0)
MCV: 95.3 fL (ref 78.0–100.0)
Monocytes Absolute: 0.3 10*3/uL (ref 0.1–1.0)
Monocytes Relative: 4 % (ref 3–12)
Neutro Abs: 6.3 10*3/uL (ref 1.7–7.7)
Neutrophils Relative %: 80 % — ABNORMAL HIGH (ref 43–77)
Platelets: 316 10*3/uL (ref 150–400)
RBC: 4.07 MIL/uL — ABNORMAL LOW (ref 4.22–5.81)
RDW: 13.5 % (ref 11.5–15.5)
WBC: 7.9 10*3/uL (ref 4.0–10.5)

## 2013-11-06 LAB — BASIC METABOLIC PANEL
CO2: 25 mEq/L (ref 19–32)
Chloride: 98 mEq/L (ref 96–112)
Creatinine, Ser: 0.61 mg/dL (ref 0.50–1.35)
GFR calc Af Amer: >90 mL/min (ref >90)
Potassium: 3.9 mEq/L (ref 3.5–5.1)
Sodium: 139 mEq/L (ref 135–145)

## 2013-11-06 LAB — ETHANOL: Alcohol, Ethyl (B): 333 mg/dL — ABNORMAL HIGH (ref 0–11)

## 2013-11-06 MED ORDER — HYDROCODONE-ACETAMINOPHEN 5-325 MG PO TABS: 1.0000 | ORAL_TABLET | Freq: Four times a day (QID) | ORAL | Status: DC | PRN

## 2013-11-06 MED ORDER — MORPHINE SULFATE 2 MG/ML IJ SOLN
2.0000 mg | Freq: Once | INTRAMUSCULAR | Status: AC
  Administered 2013-11-06: 2 mg via INTRAVENOUS
  Filled 2013-11-06: qty 1

## 2013-11-06 NOTE — ED Provider Notes (Signed)
CSN: [id number]     Arrival date & time 11/06/13  1630 History   First MD Initiated Contact with Patient 11/06/13 1637     Chief Complaint  Patient presents with  . Arm Injury   (Consider location/radiation/quality/duration/timing/severity/associated sxs/prior Treatment) HPI  This is a 72 -year-old male who presents with a right arm pain following a fall. Patient reports that he tripped and fell on his porch last night.  Patient had increasing pain and bruising to the right arm today. Patient reports heavy drinking last night as well as drinking today. He states "I drink liquor."  He denies hitting his head or losing consciousness. He is neurovascularly intact in route.  He denies any other injury. Past Medical History  Diagnosis Date  . Hypertension   . Cancer Prostate   History reviewed. No pertinent past surgical history. History reviewed. No pertinent family history. History  Substance Use Topics  . Smoking status: Current Every Day Smoker    Types: Cigarettes  . Smokeless tobacco: Not on file  . Alcohol Use: Yes    Review of Systems  Constitutional: Negative.   Respiratory: Negative.  Negative for chest tightness and shortness of breath.   Cardiovascular: Negative.  Negative for chest pain.  Gastrointestinal: Negative.  Negative for abdominal pain.  Genitourinary: Negative.  Negative for dysuria.  Musculoskeletal: Negative for neck pain.       Right arm pain  Neurological: Negative for headaches.  All other systems reviewed and are negative.    Allergies  Review of patient's allergies indicates no known allergies.  Home Medications   Current Outpatient Rx  Name  Route  Sig  Dispense  Refill  . HYDROcodone-acetaminophen (NORCO/VICODIN) 5-325 MG per tablet   Oral   Take 1-2 tablets by mouth every 6 (six) hours as needed.   10 tablet   0    BP 131/79  Pulse 111  Temp(Src) 98 F (36.7 C) (Oral)  Resp 16  SpO2 94% Physical Exam  Nursing note and vitals  reviewed. Constitutional: He is oriented to person, place, and time.  Disheveled, no acute distress  HENT:  Head: Normocephalic and atraumatic.  Mucous membranes dry, poor dentition  Cardiovascular: Normal rate, regular rhythm and normal heart sounds.   No murmur heard. Pulmonary/Chest: Effort normal and breath sounds normal. No respiratory distress.  Abdominal: Soft. Bowel sounds are normal. There is no tenderness. There is no rebound.  Musculoskeletal:  Ecchymosis over the right shoulder with obvious deformity over the proximal humerus, neurovascularly intact distally. intact radial, ulnar and axillary nerves  Lymphadenopathy:    He has no cervical adenopathy.  Neurological: He is alert and oriented to person, place, and time.  Skin: Skin is warm and dry.  Psychiatric: He has a normal mood and affect.    ED Course  Procedures (including critical care time) Labs Review Labs Reviewed  CBC WITH DIFFERENTIAL - Abnormal; Notable for the following:    RBC 4.07 (*)    HCT 38.8 (*)    Neutrophils Relative % 80 (*)    All other components within normal limits  ETHANOL - Abnormal; Notable for the following:    Alcohol, Ethyl (B) 333 (*)    All other components within normal limits  BASIC METABOLIC PANEL   Imaging Review Dg Shoulder Right  11/06/2013   CLINICAL DATA:  Fall.  Bruising.  EXAM: RIGHT SHOULDER - 2+ VIEW  COMPARISON:  None.  FINDINGS: There is an impacted, comminuted fracture involving the proximal humerus.  Mild medial angulation of the distal fracture fragments noted. No dislocation identified. There is no evidence of arthropathy or other focal bone abnormality. Soft tissues are unremarkable.  IMPRESSION: 1. Impacted, comminuted fracture of the proximal right humerus.   Electronically Signed   By: Signa Kell M.D.   On: 11/06/2013 17:21   Dg Humerus Right  11/06/2013   CLINICAL DATA:  Pain status post fall now with obvious deformity of the shoulder  EXAM: RIGHT HUMERUS -  2+ VIEW  COMPARISON:  None.  FINDINGS: The patient has sustained an angulated fracture to the neck of the right humerus. There is some rotation at the fracture site and distraction of the fracture fragments. The humeral head appears to be normally related to the glenoid. The acromion and distal clavicle appear intact as does the remainder of the shaft and distal portion of the right humerus.  IMPRESSION: The patient has sustained a slightly rotated displaced fracture of the neck of the right humerus.   Electronically Signed   By: David  [place]   On: 11/06/2013 17:20    EKG Interpretation   None       MDM   1. Humerus fracture, right, closed, initial encounter    Patient presents following a fall last night. He is clearly intoxicated.  Patient has no other evidence of trauma. X-ray is notable for a proximal humerus fracture that is displaced and angulated. Spoke with orthopedics who agree with immobilizing the patient with a sling. He will followup with Dr. Ave Filter. Patient was given pain medication but instructed not to take the pain medication with alcohol. The patient and the family stated understanding.  After history, exam, and medical workup I feel the patient has been appropriately medically screened and is safe for discharge home. Pertinent diagnoses were discussed with the patient. Patient was given return precautions.    Shon Baton, MD 11/07/13 [phone]

## 2013-11-06 NOTE — ED Notes (Signed)
Ortho tech at bedside 

## 2013-11-06 NOTE — ED Notes (Signed)
Per EMS. Pt from home. Pt has been drinking heavily this weekend. Pt states he fell on his porch today, unsure of what time. Pt has crepitis of R upper arm. No other deformity noted. Pt has good radial pulse, cap refill under 3 seconds.

## 2013-11-06 NOTE — Progress Notes (Signed)
   CARE MANAGEMENT ED NOTE 11/06/2013  Patient:  George [name], George [name]   Account Number:  [id number]  Date Initiated:  11/06/2013  Documentation initiated by:  Edd Arbour  Subjective/Objective Assessment:   72 yr old male [employer] medicare complete without pcp listed in EPIC Pt states pcp is [facility] [address] [place] c/o fell at home and drinking etoh level 333     Subjective/Objective Assessment Detail:   imaging shows slightly rotated displaced fracture of  the neck of the right humerus.    Pt able to hold cell phone in his left hand to speak with male on his telephone about a ride home per pt near d/c time     Action/Plan:   EPIC updated CM assisted pt with using his urinal and answering his cell phone in his left pant pocket   Action/Plan Detail:   Anticipated DC Date:       Status Recommendation to Physician:   Result of Recommendation:    Other ED Services  Consult Working Plan    DC Planning Services  PCP issues  Other  Outpatient Services - Pt will follow up    Choice offered to / List presented to:            Status of service:  Completed, signed off  ED Comments:   ED Comments Detail:

## 2016-08-02 ENCOUNTER — Emergency Department (HOSPITAL_COMMUNITY): Payer: Medicare Other

## 2016-08-02 ENCOUNTER — Observation Stay (HOSPITAL_COMMUNITY)
Admission: EM | Admit: 2016-08-02 | Discharge: 2016-08-03 | Disposition: A | Discharge status: Home / Self Care | Payer: Medicare Other | Attending: Internal Medicine | Admitting: Internal Medicine

## 2016-08-02 ENCOUNTER — Encounter (HOSPITAL_COMMUNITY): Payer: Self-pay

## 2016-08-02 DIAGNOSIS — R0902 Hypoxemia: Secondary | ICD-10-CM | POA: Insufficient documentation

## 2016-08-02 DIAGNOSIS — Z8546 Personal history of malignant neoplasm of prostate: Secondary | ICD-10-CM | POA: Insufficient documentation

## 2016-08-02 DIAGNOSIS — R079 Chest pain, unspecified: Secondary | ICD-10-CM | POA: Diagnosis not present

## 2016-08-02 DIAGNOSIS — I10 Essential (primary) hypertension: Secondary | ICD-10-CM | POA: Insufficient documentation

## 2016-08-02 DIAGNOSIS — D696 Thrombocytopenia, unspecified: Secondary | ICD-10-CM | POA: Diagnosis not present

## 2016-08-02 DIAGNOSIS — E876 Hypokalemia: Secondary | ICD-10-CM | POA: Insufficient documentation

## 2016-08-02 DIAGNOSIS — R0789 Other chest pain: Secondary | ICD-10-CM | POA: Diagnosis present

## 2016-08-02 DIAGNOSIS — E872 Acidosis: Secondary | ICD-10-CM | POA: Diagnosis not present

## 2016-08-02 DIAGNOSIS — F1010 Alcohol abuse, uncomplicated: Secondary | ICD-10-CM | POA: Diagnosis not present

## 2016-08-02 DIAGNOSIS — D6959 Other secondary thrombocytopenia: Secondary | ICD-10-CM

## 2016-08-02 DIAGNOSIS — F17210 Nicotine dependence, cigarettes, uncomplicated: Secondary | ICD-10-CM | POA: Insufficient documentation

## 2016-08-02 DIAGNOSIS — E860 Dehydration: Secondary | ICD-10-CM | POA: Diagnosis not present

## 2016-08-02 DIAGNOSIS — R911 Solitary pulmonary nodule: Secondary | ICD-10-CM | POA: Diagnosis not present

## 2016-08-02 DIAGNOSIS — R0689 Other abnormalities of breathing: Secondary | ICD-10-CM | POA: Insufficient documentation

## 2016-08-02 DIAGNOSIS — Z720 Tobacco use: Secondary | ICD-10-CM | POA: Diagnosis present

## 2016-08-02 DIAGNOSIS — J441 Chronic obstructive pulmonary disease with (acute) exacerbation: Secondary | ICD-10-CM | POA: Diagnosis not present

## 2016-08-02 LAB — CBC
HEMATOCRIT: 46.2 % (ref 39.0–52.0)
HEMOGLOBIN: 15.4 g/dL (ref 13.0–17.0)
MCH: 31.2 pg (ref 26.0–34.0)
MCHC: 33.3 g/dL (ref 30.0–36.0)
MCV: 93.5 fL (ref 78.0–100.0)
Platelets: 121 10*3/uL — ABNORMAL LOW (ref 150–400)
RBC: 4.94 MIL/uL (ref 4.22–5.81)
RDW: 14.7 % (ref 11.5–15.5)
WBC: 6 10*3/uL (ref 4.0–10.5)

## 2016-08-02 LAB — BASIC METABOLIC PANEL
ANION GAP: 9 (ref 5–15)
BUN: 7 mg/dL (ref 6–20)
CO2: 25 mmol/L (ref 22–32)
Calcium: 9.2 mg/dL (ref 8.9–10.3)
Chloride: 99 mmol/L — ABNORMAL LOW (ref 101–111)
Creatinine, Ser: 0.84 mg/dL (ref 0.61–1.24)
GFR calc Af Amer: >60 mL/min (ref >60)
GLUCOSE: 119 mg/dL — AB (ref 65–99)
POTASSIUM: 3.4 mmol/L — AB (ref 3.5–5.1)
Sodium: 133 mmol/L — ABNORMAL LOW (ref 135–145)

## 2016-08-02 LAB — I-STAT TROPONIN, ED: Troponin i, poc: 0 ng/mL (ref 0.00–0.08)

## 2016-08-02 LAB — ETHANOL: ALCOHOL ETHYL (B): 10 mg/dL — AB (ref <5)

## 2016-08-02 LAB — LIPID PANEL
CHOL/HDL RATIO: 2.7 ratio
CHOLESTEROL: 150 mg/dL (ref 0–200)
HDL: 55 mg/dL (ref >40)
LDL Cholesterol: 84 mg/dL (ref 0–99)
TRIGLYCERIDES: 54 mg/dL (ref <150)
VLDL: 11 mg/dL (ref 0–40)

## 2016-08-02 LAB — URINALYSIS, ROUTINE W REFLEX MICROSCOPIC
Bilirubin Urine: NEGATIVE
Glucose, UA: NEGATIVE mg/dL
Hgb urine dipstick: NEGATIVE
Ketones, ur: NEGATIVE mg/dL
Leukocytes, UA: NEGATIVE
Nitrite: NEGATIVE
Protein, ur: NEGATIVE mg/dL
Specific Gravity, Urine: 1.011 (ref 1.005–1.030)
pH: 6 (ref 5.0–8.0)

## 2016-08-02 LAB — I-STAT CG4 LACTIC ACID, ED: LACTIC ACID, VENOUS: 2.6 mmol/L — AB (ref 0.5–1.9)

## 2016-08-02 LAB — TROPONIN I: Troponin I: <0.03 ng/mL (ref <0.03)

## 2016-08-02 LAB — RAPID URINE DRUG SCREEN, HOSP PERFORMED
Amphetamines: NOT DETECTED
Barbiturates: NOT DETECTED
Benzodiazepines: NOT DETECTED
Cocaine: NOT DETECTED
OPIATES: NOT DETECTED
Tetrahydrocannabinol: NOT DETECTED

## 2016-08-02 LAB — LACTIC ACID, PLASMA: Lactic Acid, Venous: 5.9 mmol/L (ref 0.5–1.9)

## 2016-08-02 MED ORDER — SODIUM CHLORIDE 0.9 % IV BOLUS (SEPSIS)
500.0000 mL | Freq: Once | INTRAVENOUS | Status: DC
Start: 2016-08-02 — End: 2016-08-03

## 2016-08-02 MED ORDER — ADULT MULTIVITAMIN W/MINERALS CH
1.0000 | ORAL_TABLET | Freq: Every day | ORAL | Status: DC
  Administered 2016-08-02 – 2016-08-03 (×2): 1 via ORAL
  Filled 2016-08-02 (×2): qty 1

## 2016-08-02 MED ORDER — ASPIRIN 81 MG PO CHEW: 324.0000 mg | CHEWABLE_TABLET | Freq: Once | ORAL | Status: DC

## 2016-08-02 MED ORDER — ONDANSETRON HCL 4 MG/2ML IJ SOLN: 4.0000 mg | Freq: Four times a day (QID) | INTRAMUSCULAR | Status: DC | PRN

## 2016-08-02 MED ORDER — LORAZEPAM 2 MG/ML IJ SOLN: 1.0000 mg | Freq: Four times a day (QID) | INTRAMUSCULAR | Status: DC | PRN

## 2016-08-02 MED ORDER — LORAZEPAM 1 MG PO TABS
1.0000 mg | ORAL_TABLET | Freq: Four times a day (QID) | ORAL | Status: DC | PRN
Start: 2016-08-02 — End: 2016-08-03

## 2016-08-02 MED ORDER — IOPAMIDOL (ISOVUE-370) INJECTION 76%
INTRAVENOUS | Status: AC
  Administered 2016-08-02: 100 mL
  Filled 2016-08-02: qty 100

## 2016-08-02 MED ORDER — IPRATROPIUM-ALBUTEROL 0.5-2.5 (3) MG/3ML IN SOLN
3.0000 mL | Freq: Three times a day (TID) | RESPIRATORY_TRACT | Status: DC
  Administered 2016-08-02 – 2016-08-03 (×5): 3 mL via RESPIRATORY_TRACT
  Filled 2016-08-02 (×5): qty 3

## 2016-08-02 MED ORDER — PREDNISONE 50 MG PO TABS
60.0000 mg | ORAL_TABLET | Freq: Every day | ORAL | Status: DC
  Administered 2016-08-02 – 2016-08-03 (×2): 60 mg via ORAL
  Filled 2016-08-02 (×2): qty 1

## 2016-08-02 MED ORDER — SODIUM CHLORIDE 0.9 % IV BOLUS (SEPSIS)
1000.0000 mL | Freq: Once | INTRAVENOUS | Status: AC
Start: 2016-08-02 — End: 2016-08-02
  Administered 2016-08-02: 1000 mL via INTRAVENOUS

## 2016-08-02 MED ORDER — ACETAMINOPHEN 325 MG PO TABS: 650.0000 mg | ORAL_TABLET | ORAL | Status: DC | PRN

## 2016-08-02 MED ORDER — FOLIC ACID 1 MG PO TABS
1.0000 mg | ORAL_TABLET | Freq: Every day | ORAL | Status: DC
  Administered 2016-08-02 – 2016-08-03 (×2): 1 mg via ORAL
  Filled 2016-08-02 (×2): qty 1

## 2016-08-02 MED ORDER — NICOTINE 21 MG/24HR TD PT24
21.0000 mg | MEDICATED_PATCH | Freq: Every day | TRANSDERMAL | Status: DC
  Administered 2016-08-02 – 2016-08-03 (×2): 21 mg via TRANSDERMAL
  Filled 2016-08-02 (×2): qty 1

## 2016-08-02 MED ORDER — IPRATROPIUM-ALBUTEROL 0.5-2.5 (3) MG/3ML IN SOLN: 3.0000 mL | RESPIRATORY_TRACT | Status: DC | PRN

## 2016-08-02 MED ORDER — SODIUM CHLORIDE 0.9 % IV SOLN
INTRAVENOUS | Status: AC
  Administered 2016-08-02: 07:00:00 via INTRAVENOUS

## 2016-08-02 MED ORDER — VITAMIN B-1 100 MG PO TABS
100.0000 mg | ORAL_TABLET | Freq: Every day | ORAL | Status: DC
  Administered 2016-08-02 – 2016-08-03 (×2): 100 mg via ORAL
  Filled 2016-08-02 (×2): qty 1

## 2016-08-02 MED ORDER — POTASSIUM CHLORIDE CRYS ER 20 MEQ PO TBCR
40.0000 meq | EXTENDED_RELEASE_TABLET | Freq: Once | ORAL | Status: AC
Start: 2016-08-02 — End: 2016-08-02
  Administered 2016-08-02: 40 meq via ORAL
  Filled 2016-08-02: qty 2

## 2016-08-02 MED ORDER — THIAMINE HCL 100 MG/ML IJ SOLN: 100.0000 mg | Freq: Every day | INTRAMUSCULAR | Status: DC

## 2016-08-02 NOTE — Progress Notes (Signed)
CRITICAL VALUE ALERT  Critical value received:  Lactic Acid 5.9  Date of notification:  08/02/2016  Time of notification:  0849  Critical value read back: yes  Nurse who received alert: Alyse Low  MD notified (1st page):  Mikhail  Time of first page:  [phone]  MD notified (2nd page):  Time of second page:  Responding MD:  Ree Kida  Time MD responded:  [phone]

## 2016-08-02 NOTE — ED Notes (Signed)
Patient transported to CT via stretcher.

## 2016-08-02 NOTE — ED Notes (Signed)
Patient transported to X-ray 

## 2016-08-02 NOTE — ED Triage Notes (Signed)
Comes from home via EMS for chest pain and chest tightness around 2245.  Upon listening his chest sounds congested. EMS gave Solumedrol at midnight and had Atrovent going as he arrived.  Patient is awake and alert x4. 1 Nitro was given by EMS.  Chest pain almost gone now

## 2016-08-02 NOTE — ED Provider Notes (Addendum)
[facility] DEPT Provider Note   CSN: [id number] Arrival date & time: 08/02/16  0016  By signing my name below, I, Maud Deed. Royston Sinner, attest that this documentation has been prepared under the direction and in the presence of Courteney Julio Alm, MD.  Electronically Signed: Maud Deed. Royston Sinner, ED Scribe. 08/02/16. 1:00 AM.    History   Chief Complaint Chief Complaint  Patient presents with  . Chest Pain   The history is provided by the patient. No language interpreter was used.    HPI Comments: CAIUS SILBERNAGEL, current every day smoker, brought in by EMS from home, is a 75 y.o. male with a PMHx of prostate cancer and HTN current smoker who presents to the Emergency Department complaining of intermittent, unchanged chest pain onset 2245. Pain is described as tightness. However, currently he is chest pain free. Pt also reports an ongoing HA and mild bilateral lower extremity swelling. No aggravating or alleviating factors at this time. Pt was given Solumedrol at midnight and Atrovent en route to department. No recent fever, chills, nausea, or vomiting. No recent long distance travel. He does not wear oxygen at home. Last follow up with PCP 6 months ago.  PCP: Vonna Drafts, FNP    Past Medical History:  Diagnosis Date  . Cancer [facility]) Prostate  . Hypertension     Patient Active Problem List   Diagnosis Date Noted  . 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 Rib fractures, left, closed, initial encounter 04/17/2012  . Alcohol abuse 04/16/2012  . Alcohol intoxication ([place]) 04/16/2012  . Aspiration pneumonia ([place]) 04/16/2012  . Tobacco abuse 04/16/2012  . History of prostate cancer 04/16/2012  . Poor dentition 04/16/2012    History reviewed. No pertinent surgical history.     Home Medications    Prior to Admission medications   Medication Sig Start Date End Date Taking? Authorizing Provider  HYDROcodone-acetaminophen (NORCO/VICODIN) 5-325 MG per tablet Take 1-2 tablets  by mouth every 6 (six) hours as needed. Patient not taking: Reported on 08/02/2016 11/06/13   Merryl Hacker, MD    Family History History reviewed. No pertinent family history.  Social History Social History  Substance Use Topics  . Smoking status: Current Every Day Smoker    Types: Cigarettes  . Smokeless tobacco: Never Used  . Alcohol use Yes     Allergies   Review of patient's allergies indicates no known allergies.   Review of Systems Review of Systems  Constitutional: Negative for chills and fever.  Respiratory: Positive for shortness of breath.   Cardiovascular: Positive for chest pain and leg swelling.  Gastrointestinal: Negative for nausea and vomiting.  Neurological: Positive for headaches.  All other systems reviewed and are negative.    Physical Exam Updated Vital Signs BP 117/71   Pulse 111   Temp 97.1 F (36.2 C) (Axillary)   Resp 15   SpO2 100%   Physical Exam  Constitutional: He is oriented to person, place, and time.  Pt is cachetic.   HENT:  Head: Normocephalic and atraumatic.  Eyes: EOM are normal.  Neck: Normal range of motion.  Cardiovascular: Normal rate, regular rhythm, normal heart sounds and intact distal pulses.   Pulmonary/Chest: Tachypnea (Mild) noted. No respiratory distress.  Abdominal: Soft. He exhibits no distension. There is no tenderness.  Musculoskeletal: Normal range of motion.  Neurological: He is alert and oriented to person, place, and time.  Skin: Skin is warm and dry.  Psychiatric: He has a normal mood and affect. Judgment  normal.  Nursing note and vitals reviewed.    ED Treatments / Results   DIAGNOSTIC STUDIES: Oxygen Saturation is 99% on 4 Liters, Normal by my interpretation.    COORDINATION OF CARE: 1:00 AM- Will order blood work, EKG, and CXR. Discussed treatment plan with pt at bedside and pt agreed to plan.     1:12 AM- Pt was counseled on smoking and smoking cessation.   Labs (all labs ordered are  listed, but only abnormal results are displayed) Labs Reviewed  BASIC METABOLIC PANEL - Abnormal; Notable for the following:       Result Value   Sodium 133 (*)    Potassium 3.4 (*)    Chloride 99 (*)    Glucose, Bld 119 (*)    All other components within normal limits  CBC - Abnormal; Notable for the following:    Platelets 121 (*)    All other components within normal limits  ETHANOL - Abnormal; Notable for the following:    Alcohol, Ethyl (B) 10 (*)    All other components within normal limits  I-STAT CG4 LACTIC ACID, ED - Abnormal; Notable for the following:    Lactic Acid, Venous 2.60 (*)    All other components within normal limits  URINE CULTURE  URINALYSIS, ROUTINE W REFLEX MICROSCOPIC (NOT AT [facility])  I-STAT TROPOININ, ED  I-STAT TROPOININ, ED    EKG  EKG Interpretation None       EKG shows HR 101 no evidence of acute ischemia.    Radiology Dg Chest 2 View  Result Date: 08/02/2016 CLINICAL DATA:  Chest pain and tightness tonight. Hypertension. Smoker. EXAM: CHEST  2 VIEW COMPARISON:  04/16/2012 FINDINGS: Normal heart size and pulmonary vascularity. Diffuse emphysematous changes and scattered fibrosis in the lungs. No focal airspace disease or consolidation. No blunting of costophrenic angles. No pneumothorax. Mediastinal contours appear intact. Calcification of the aorta. IMPRESSION: Emphysematous changes in the lungs. No evidence of active pulmonary disease. Electronically Signed   By: Lucienne Capers M.D.   On: 08/02/2016 01:43   Ct Angio Chest Pe W And/or Wo Contrast  Result Date: 08/02/2016 CLINICAL DATA:  Intermittent chest pain since 01/26/1939 5 hours. Headaches and mild bilateral lower extremity swelling. History of prostate cancer and hypertension. Shortness of breath, hypoxia, and weight loss. Smoker. Tachycardia. EXAM: CT ANGIOGRAPHY CHEST WITH CONTRAST TECHNIQUE: Multidetector CT imaging of the chest was performed using the standard protocol during bolus  administration of intravenous contrast. Multiplanar CT image reconstructions and MIPs were obtained to evaluate the vascular anatomy. CONTRAST:  100 mL Isovue 370 COMPARISON:  04/05/2013 FINDINGS: Technically adequate study with good opacification of the central and segmental pulmonary arteries. No focal filling defects demonstrated. No evidence of significant pulmonary embolus. Normal heart size. Normal caliber thoracic aorta. Scattered aortic calcifications. No aortic dissection. Great vessel origins are patent. Esophagus is decompressed. No significant lymphadenopathy in the chest. Prominent diffuse emphysematous changes throughout the lungs with scattered fibrosis. Focal nodular infiltration in the right upper pole lung measuring 1.5 x 0.9 cm. This was not present on prior study and could represent inflammatory focus but a malignant nodule is not excluded. 3 mm nodule in the right middle lung, not seen previously. 5 mm nodule in the superior segment right lower lobe which was present previously and is therefore likely benign. No pneumothorax. No pleural effusions. Debris within the trachea likely representing mucoid material. Mild bronchial wall thickening centrally probably representing chronic bronchitic change. Included portions of the upper abdominal organs  are grossly unremarkable. Degenerative changes in spine. No destructive bone lesions. Review of the MIP images confirms the above findings. IMPRESSION: No evidence of significant pulmonary embolus. Diffuse emphysematous changes throughout the lungs. **An incidental finding of potential clinical significance has been found. 1.5 x 0.9 cm nodular lesion in the right upper lung and additional smaller nodules in the right lung. Non-contrast chest CT at 3 months is recommended. If nodules persist, subsequent management will be based upon the most suspicious nodule(s). This recommendation follows the consensus statement: Guidelines for Management of Incidental  Pulmonary Nodules Detected on CT Images:From the Fleischner Society 2017; published online before print (10.1148/[id number]).** Electronically Signed   By: Lucienne Capers M.D.   On: 08/02/2016 03:26    Procedures Procedures (including critical care time)  Medications Ordered in ED Medications  sodium chloride 0.9 % bolus 1,000 mL (1,000 mLs Intravenous New Bag/Given 08/02/16 0124)  iopamidol (ISOVUE-370) 76 % injection (100 mLs  Contrast Given 08/02/16 0300)     Initial Impression / Assessment and Plan / ED Course  I have reviewed the triage vital signs and the nursing notes.  Pertinent labs & imaging results that were available during my care of the patient were reviewed by me and considered in my medical decision making (see chart for details).  Clinical Course   Patient is a 75 year old male who does not have very good follow-up, presenting today with chest tightness, and shortness of breath. Patient sees the [place] occasionally. He reports that he had sudden onset of chest tightness.  On EMS arrival, they percieved he had shortness of breath and he was given solumedrol and albuterol.   History of aspiration pneumonia. Concern for pneumonia today. Patient's lung exam now clear.  Chest pain improved.  4:53 AM Elevated lactic, initial trop negative. Initial EKG non ischemic. Given elevated heart score, will need to admit for CP rule out. Patient's HR still elevated despite 1 L, will give 500 more. CTPE negative.   Patient 02 drops to 90 on ambulation.   Final Clinical Impressions(s) / ED Diagnoses   Final diagnoses:  None    New Prescriptions New Prescriptions   No medications on file   I personally performed the services described in this documentation, which was scribed in my presence. The recorded information has been reviewed and is accurate.      Courteney Julio Alm, MD 08/02/16 [place], MD 08/02/16 [id number]

## 2016-08-02 NOTE — Progress Notes (Signed)
PROGRESS NOTE    JD MCCASTER  QPY:[id number] DOB: Jan 28, 1941 DOA: 08/02/2016 PCP: Vonna Drafts, FNP   Chief Complaint  Patient presents with  . Chest Pain    Brief Narrative:  HPI on 08/02/2016 by Dr. Lily Kocher George [name] is a 75 y.o. gentleman with a history of HTN (not taking anti-hypertensives at this time) and prostate cancer, followed at the [facility], who presents to the ED tonight complaining of chest pain and tightness.  The patient reports the onset of symptoms around midnight, while watching TV.  Chest tightness associated with shortness of breath and headache.  No nausea, vomiting, or diaphoresis  He took an OTC pain medication that did not improve symptoms.  He says that he has an intolerance to Aspirin (it causes upset stomach but he denies history of GI bleed or ulcer disease).  He called 911.  Per reports, the patient was congested on lung exam, and he received IV solumedrol and a nebulizer treatment.  The patient says that he was chest pain free upon arrival to the ED. Assessment & Plan   Chest pain -Appears to have resolved, per patient -And sputum cycle troponin, first to unremarkable -Echocardiogram pending -Aspirin held due to reported intolerance and thrombocytopenia -Pending hemoglobin A1c -Lipid panel: TC 150, TG 54, HDL 55, LDL 84  COPD exacerbation, mild/acute respiratory insufficiency with hypoxia -Emphysematous changes chest xray explained to patient.  He does not have a prior history of COPD. -Patient noted to have SPO2 of 88% -CTA chest: Not stated her pulmonary embolism, diffuse emphysematous changes throughout the lungs -Clinically improved tonight with steroids and nebulizer treatment.  Continue prednisone '60mg'$  po daily for now. -Continue neb treatments, steroids, continue supplemental oxygen to maintain sats above 92% -Patient will need to follow-up with pulmonology at the [facility] upon discharge  Pulmonary nodule -Noted on CT chest, 1.5 x  0.9 cm -We'll need repeat CT in 3 months  Hypokalemia -Replaced, continue to monitor BMP -Will obtain magnesium level  Lactic acidosis -Lactic increased to 5.9 -Suspect due to dehydration and alcohol intake -Nothing by mouth IV fluids, monitor lactic acid -UA and chest x-ray unremarkable for infection, patient afebrile with no leukocytosis  Active EtOH and tobacco use -Placed on CIWA, continue multivitamin, folic acid, thiamine -Nicotine patch -Urine tox screen negative -Alcohol level 10 on admission  DVT Prophylaxis  SCDs  Code Status: Full  Family Communication: None at bedside  Disposition Plan: Admitted for observation  Consultants none  Procedures  none  Antibiotics   Anti-infectives    None      Subjective:   George [name] seen and examined today.  Feels chest pain has resolved.  Has no complaints of shortness of breath, Pain, nausea, vomiting, diarrhea, constipation, dizziness or headache. Does endorse drinking occasionally and smoking a pack per day.  Objective:   Vitals:   08/02/16 0545 08/02/16 0625 08/02/16 0626 08/02/16 0915  BP: 141/78  (!) 159/82   Pulse: 90  88   Resp: 13     Temp:   98.2 F (36.8 C)   TempSrc:   Axillary   SpO2: 96% 98%  98%  Weight:  51.8 kg (114 lb 3.2 oz) 53.5 kg (118 lb)   Height:   '5\' 6"'$  (1.676 m)     Intake/Output Summary (Last 24 hours) at 08/02/16 1051 Last data filed at 08/02/16 0854  Gross per 24 hour  Intake  220 ml  Output                0 ml  Net              220 ml   Filed Weights   08/02/16 0625 08/02/16 0626  Weight: 51.8 kg (114 lb 3.2 oz) 53.5 kg (118 lb)    Exam  General: Well developed, thin, cachetic, NAD  HEENT: NCAT, mucous membranes moist.   Cardiovascular: S1 S2 auscultated, no rubs, murmurs or gallops. Regular rate and rhythm.  Respiratory: Clear to auscultation bilaterally with equal chest rise  Abdomen: Soft, nontender, nondistended, + bowel sounds  Extremities:  warm dry without cyanosis clubbing or edema  Neuro: AAOx3,nonfocal  Psych: Normal affect and demeanor with intact judgement and insight   Data Reviewed: I have personally reviewed following labs and imaging studies  CBC:  Recent Labs Lab 08/02/16 0050  WBC 6.0  HGB 15.4  HCT 46.2  MCV 93.5  PLT 387*   Basic Metabolic Panel:  Recent Labs Lab 08/02/16 0050  NA 133*  K 3.4*  CL 99*  CO2 25  GLUCOSE 119*  BUN 7  CREATININE 0.84  CALCIUM 9.2   GFR: Estimated Creatinine Clearance: 59.3 mL/min (by C-G formula based on SCr of 0.84 mg/dL). Liver Function Tests: No results for input(s): AST, ALT, ALKPHOS, BILITOT, PROT, ALBUMIN in the last 168 hours. No results for input(s): LIPASE, AMYLASE in the last 168 hours. No results for input(s): AMMONIA in the last 168 hours. Coagulation Profile: No results for input(s): INR, PROTIME in the last 168 hours. Cardiac Enzymes:  Recent Labs Lab 08/02/16 0512 08/02/16 0646  TROPONINI <0.03 <0.03   BNP (last 3 results) No results for input(s): PROBNP in the last 8760 hours. HbA1C: No results for input(s): HGBA1C in the last 72 hours. CBG: No results for input(s): GLUCAP in the last 168 hours. Lipid Profile:  Recent Labs  08/02/16 0646  CHOL 150  HDL 55  LDLCALC 84  TRIG 54  CHOLHDL 2.7   Thyroid Function Tests: No results for input(s): TSH, T4TOTAL, FREET4, T3FREE, THYROIDAB in the last 72 hours. Anemia Panel: No results for input(s): VITAMINB12, FOLATE, FERRITIN, TIBC, IRON, RETICCTPCT in the last 72 hours. Urine analysis:    Component Value Date/Time   COLORURINE YELLOW 08/02/2016 0250   APPEARANCEUR CLEAR 08/02/2016 0250   LABSPEC 1.011 08/02/2016 0250   PHURINE 6.0 08/02/2016 0250   GLUCOSEU NEGATIVE 08/02/2016 0250   HGBUR NEGATIVE 08/02/2016 0250   BILIRUBINUR NEGATIVE 08/02/2016 0250   KETONESUR NEGATIVE 08/02/2016 0250   PROTEINUR NEGATIVE 08/02/2016 0250   UROBILINOGEN 1.0 04/16/2012 0746   NITRITE  NEGATIVE 08/02/2016 0250   LEUKOCYTESUR NEGATIVE 08/02/2016 0250   Sepsis Labs: '@LABRCNTIP'$ (procalcitonin:4,lacticidven:4)  )No results found for this or any previous visit (from the past 240 hour(s)).    Radiology Studies: Dg Chest 2 View  Result Date: 08/02/2016 CLINICAL DATA:  Chest pain and tightness tonight. Hypertension. Smoker. EXAM: CHEST  2 VIEW COMPARISON:  04/16/2012 FINDINGS: Normal heart size and pulmonary vascularity. Diffuse emphysematous changes and scattered fibrosis in the lungs. No focal airspace disease or consolidation. No blunting of costophrenic angles. No pneumothorax. Mediastinal contours appear intact. Calcification of the aorta. IMPRESSION: Emphysematous changes in the lungs. No evidence of active pulmonary disease. Electronically Signed   By: Lucienne Capers M.D.   On: 08/02/2016 01:43   Ct Angio Chest Pe W And/or Wo Contrast  Result Date: 08/02/2016 CLINICAL DATA:  Intermittent chest pain  since 01/26/1939 5 hours. Headaches and mild bilateral lower extremity swelling. History of prostate cancer and hypertension. Shortness of breath, hypoxia, and weight loss. Smoker. Tachycardia. EXAM: CT ANGIOGRAPHY CHEST WITH CONTRAST TECHNIQUE: Multidetector CT imaging of the chest was performed using the standard protocol during bolus administration of intravenous contrast. Multiplanar CT image reconstructions and MIPs were obtained to evaluate the vascular anatomy. CONTRAST:  100 mL Isovue 370 COMPARISON:  04/05/2013 FINDINGS: Technically adequate study with good opacification of the central and segmental pulmonary arteries. No focal filling defects demonstrated. No evidence of significant pulmonary embolus. Normal heart size. Normal caliber thoracic aorta. Scattered aortic calcifications. No aortic dissection. Great vessel origins are patent. Esophagus is decompressed. No significant lymphadenopathy in the chest. Prominent diffuse emphysematous changes throughout the lungs with  scattered fibrosis. Focal nodular infiltration in the right upper pole lung measuring 1.5 x 0.9 cm. This was not present on prior study and could represent inflammatory focus but a malignant nodule is not excluded. 3 mm nodule in the right middle lung, not seen previously. 5 mm nodule in the superior segment right lower lobe which was present previously and is therefore likely benign. No pneumothorax. No pleural effusions. Debris within the trachea likely representing mucoid material. Mild bronchial wall thickening centrally probably representing chronic bronchitic change. Included portions of the upper abdominal organs are grossly unremarkable. Degenerative changes in spine. No destructive bone lesions. Review of the MIP images confirms the above findings. IMPRESSION: No evidence of significant pulmonary embolus. Diffuse emphysematous changes throughout the lungs. **An incidental finding of potential clinical significance has been found. 1.5 x 0.9 cm nodular lesion in the right upper lung and additional smaller nodules in the right lung. Non-contrast chest CT at 3 months is recommended. If nodules persist, subsequent management will be based upon the most suspicious nodule(s). This recommendation follows the consensus statement: Guidelines for Management of Incidental Pulmonary Nodules Detected on CT Images:From the Fleischner Society 2017; published online before print (10.1148/radiol.[id number]).** Electronically Signed   By: Lucienne Capers M.D.   On: 08/02/2016 03:26     Scheduled Meds: . folic acid  1 mg Oral Daily  . ipratropium-albuterol  3 mL Nebulization TID  . multivitamin with minerals  1 tablet Oral Daily  . nicotine  21 mg Transdermal Daily  . predniSONE  60 mg Oral Q breakfast  . sodium chloride  500 mL Intravenous Once  . thiamine  100 mg Oral Daily   Or  . thiamine  100 mg Intravenous Daily   Continuous Infusions: . sodium chloride 100 mL/hr at 08/02/16 0630     LOS: 0 days    Time Spent in minutes   30 minutes  Teja Costen D.O. on 08/02/2016 at 10:51 AM  Between 7am to 7pm - Pager - [phone]  After 7pm go to [email or web address] - password [email or web address]  And look for the night coverage person covering for me after hours  [facility] Office  [phone]

## 2016-08-02 NOTE — ED Notes (Addendum)
Patient ambulated in hallway without oxygen with RN & Dr. Thomasene Lot accompanying. Pulse ox 90% on room air when patient returned to room. Pulse elevated - 100-110's.

## 2016-08-02 NOTE — H&P (Signed)
History and Physical    OSMAR [name] [id number]:[id number] DOB: [birth year] DOA: 08/02/2016  PCP: George Drafts, FNP   Patient coming from: Home  Chief Complaint: Chest pain and shortness of breath  HPI: George [name] is a 75 y.o. gentleman with a history of HTN (not taking anti-hypertensives at this time) and prostate cancer, followed at the [facility], who presents to the ED tonight complaining of chest pain and tightness.  The patient reports the onset of symptoms around midnight, while watching TV.  Chest tightness associated with shortness of breath and headache.  No nausea, vomiting, or diaphoresis  He took an OTC pain medication that did not improve symptoms.  He says that he has an intolerance to Aspirin (it causes upset stomach but he denies history of GI bleed or ulcer disease).  He called 911.  Per reports, the patient was congested on lung exam, and he received IV solumedrol and a nebulizer treatment.  The patient says that he was chest pain free upon arrival to the ED.  ED Course: EKG shows NSR without acute ST segment changes concerning for ischemia.  Chest xray shows chronic changes.  CTA of the chest is negative for PE, but there is an incidental finding of a pulmonary nodule, approximately 1.5cm, in the RUL.  The patient had a lactic acid level of 2.6.  No fever.  No leukocytosis. U/A unremarkable.  First troponin negative.  The patient received 1.5L NS bolus.  Hospitalist asked to admit.  Of note, he also has a potassium level of 3.4, platelet count of 121, EtOH level 10.  He admits that he was drinking EtOH yesterday.  Review of Systems: As per HPI otherwise 10 point review of systems negative.    Past Medical History:  Diagnosis Date  . Cancer [facility]) Prostate  . Hypertension     History reviewed. No pertinent surgical history.   reports that he has been smoking Cigarettes.  He has never used smokeless tobacco. He reports that he drinks alcohol. He reports that he does  not use drugs.  Active tobacco use for most of his life.  1ppd currently.  Active EtOH use on the weekends.  He denies history of EtOH withdrawal.  Denies illicit drug use.  He is not married.  He does not have children.  He identifies his sister George [name] as his next of kin/emergency contact.  Allergies  Allergen Reactions  . Aspirin    FAMILY HISTORY: His mother had diabetes.  A paternal uncle also had diabetes.  He denies family history of heart disease, CVA, or cancer.  Prior to Admission medications   Medication Sig Start Date End Date Taking? Authorizing Provider  HYDROcodone-acetaminophen (NORCO/VICODIN) 5-325 MG per tablet Take 1-2 tablets by mouth every 6 (six) hours as needed. Patient not taking: Reported on 08/02/2016 11/06/13   Merryl Hacker, MD    Physical Exam: Vitals:   08/02/16 0430 08/02/16 0500 08/02/16 0515 08/02/16 0530  BP: 123/72 134/72 135/80 155/83  Pulse: 106 (!) 140 109 93  Resp:  '13 13 12  '$ Temp:      TempSrc:      SpO2: 100% 98% (!) 88% 100%      Constitutional: NAD, calm, comfortable Vitals:   08/02/16 0430 08/02/16 0500 08/02/16 0515 08/02/16 0530  BP: 123/72 134/72 135/80 155/83  Pulse: 106 (!) 140 109 93  Resp:  '13 13 12  '$ Temp:      TempSrc:      SpO2:  100% 98% (!) 88% 100%   Eyes: PERRL, lids and conjunctivae normal ENMT: Mucous membranes are moist. Posterior pharynx clear of any exudate or lesions. Edentulous. Neck: normal appearance, supple, no masses Respiratory: clear to auscultation bilaterally, no wheezing, no crackles. Normal respiratory effort. No accessory muscle use.  Cardiovascular: Normal rate, regular rhythm, no murmurs / rubs / gallops. No extremity edema. 2+ pedal pulses. No carotid bruits.  GI: abdomen is soft and compressible.  No distention.  No tenderness.  No masses palpated.  Bowel sounds are hypoactive. Musculoskeletal:  No joint deformity in upper and lower extremities. Good ROM, no contractures. Normal muscle  tone.  Skin: no rashes, warm and dry Neurologic: CN 2-12 grossly intact. Sensation intact, Strength symmetric bilaterally, 5/5  Psychiatric: Normal judgment and insight. Alert and oriented x 3. Normal mood.     Labs on Admission: I have personally reviewed following labs and imaging studies  CBC:  Recent Labs Lab 08/02/16 0050  WBC 6.0  HGB 15.4  HCT 46.2  MCV 93.5  PLT 654*   Basic Metabolic Panel:  Recent Labs Lab 08/02/16 0050  NA 133*  K 3.4*  CL 99*  CO2 25  GLUCOSE 119*  BUN 7  CREATININE 0.84  CALCIUM 9.2   GFR: CrCl cannot be calculated (Unknown ideal weight.).  Urine analysis:    Component Value Date/Time   COLORURINE YELLOW 08/02/2016 0250   APPEARANCEUR CLEAR 08/02/2016 0250   LABSPEC 1.011 08/02/2016 0250   PHURINE 6.0 08/02/2016 0250   GLUCOSEU NEGATIVE 08/02/2016 0250   HGBUR NEGATIVE 08/02/2016 0250   BILIRUBINUR NEGATIVE 08/02/2016 0250   KETONESUR NEGATIVE 08/02/2016 0250   PROTEINUR NEGATIVE 08/02/2016 0250   UROBILINOGEN 1.0 04/16/2012 0746   NITRITE NEGATIVE 08/02/2016 0250   LEUKOCYTESUR NEGATIVE 08/02/2016 0250    Radiological Exams on Admission: Dg Chest 2 View  Result Date: 08/02/2016 CLINICAL DATA:  Chest pain and tightness tonight. Hypertension. Smoker. EXAM: CHEST  2 VIEW COMPARISON:  04/16/2012 FINDINGS: Normal heart size and pulmonary vascularity. Diffuse emphysematous changes and scattered fibrosis in the lungs. No focal airspace disease or consolidation. No blunting of costophrenic angles. No pneumothorax. Mediastinal contours appear intact. Calcification of the aorta. IMPRESSION: Emphysematous changes in the lungs. No evidence of active pulmonary disease. Electronically Signed   By: Lucienne Capers M.D.   On: 08/02/2016 01:43   Ct Angio Chest Pe W And/or Wo Contrast  Result Date: 08/02/2016 CLINICAL DATA:  Intermittent chest pain since 01/26/1939 5 hours. Headaches and mild bilateral lower extremity swelling. History of  prostate cancer and hypertension. Shortness of breath, hypoxia, and weight loss. Smoker. Tachycardia. EXAM: CT ANGIOGRAPHY CHEST WITH CONTRAST TECHNIQUE: Multidetector CT imaging of the chest was performed using the standard protocol during bolus administration of intravenous contrast. Multiplanar CT image reconstructions and MIPs were obtained to evaluate the vascular anatomy. CONTRAST:  100 mL Isovue 370 COMPARISON:  04/05/2013 FINDINGS: Technically adequate study with good opacification of the central and segmental pulmonary arteries. No focal filling defects demonstrated. No evidence of significant pulmonary embolus. Normal heart size. Normal caliber thoracic aorta. Scattered aortic calcifications. No aortic dissection. Great vessel origins are patent. Esophagus is decompressed. No significant lymphadenopathy in the chest. Prominent diffuse emphysematous changes throughout the lungs with scattered fibrosis. Focal nodular infiltration in the right upper pole lung measuring 1.5 x 0.9 cm. This was not present on prior study and could represent inflammatory focus but a malignant nodule is not excluded. 3 mm nodule in the right middle lung, not  seen previously. 5 mm nodule in the superior segment right lower lobe which was present previously and is therefore likely benign. No pneumothorax. No pleural effusions. Debris within the trachea likely representing mucoid material. Mild bronchial wall thickening centrally probably representing chronic bronchitic change. Included portions of the upper abdominal organs are grossly unremarkable. Degenerative changes in spine. No destructive bone lesions. Review of the MIP images confirms the above findings. IMPRESSION: No evidence of significant pulmonary embolus. Diffuse emphysematous changes throughout the lungs. **An incidental finding of potential clinical significance has been found. 1.5 x 0.9 cm nodular lesion in the right upper lung and additional smaller nodules in the  right lung. Non-contrast chest CT at 3 months is recommended. If nodules persist, subsequent management will be based upon the most suspicious nodule(s). This recommendation follows the consensus statement: Guidelines for Management of Incidental Pulmonary Nodules Detected on CT Images:From the Fleischner Society 2017; published online before print (10.1148/radiol.8270786754).** Electronically Signed   By: Lucienne Capers M.D.   On: 08/02/2016 03:26    EKG: Independently reviewed. NSR.  Borderline LVH with repolarization abnormalities.  J point elevation in lead v3.  I do not think he has ST elevation greater than 57m in V1 or V2.  Assessment/Plan Principal Problem:   COPD exacerbation (HCC) Active Problems:   Alcohol abuse   Tobacco abuse   Thrombocytopenia, acquired   Chest tightness   Pulmonary nodule   Dehydration   Chest pain      Chest pain --Admit for observation, telemetry --Serial troponin --Echo in the AM --Aspirin held due to reported intolerance and thrombocytopenia --Can consider stress test if troponin negative --A1c and fasting lipid panel for risk factor stratification  COPD exacerbation, mild --Emphysematous changes chest xray explained to patient.  He does not have a prior history of COPD. --Clinically improved tonight with steroids and nebulizer treatment.  Continue prednisone '60mg'$  po daily for now. --Duoneb TID and q4h prn --Supplemental oxygen as needed to keep O2 sats greater than 90%  He has demonstrated O2 requirement in the ED. --[facility] Pulmonary nodule --Disclosed to the patient.  He will need repeat imaging in 3 months.  Hypokalemia --Replacement ordered  Mildly elevated lactic acid --S/P IV hydration.  Repeat level one time.  Active EtOH and tobacco use --Check urine drug screen   DVT prophylaxis: SCDs  Code Status: FULL Family Communication: Patient alone at time of admission in the ED Disposition Plan:  Expect he will discharge home in 24-48 hours Consults called: NONE Admission status: Observation, telemetry   TIME SPENT: 60 minutes   CEber JonesMD [facility] Pager [phone] If 7PM-7AM, please contact night-coverage [email or web address] Password [email or web address] 08/02/2016, 5:37 AM

## 2016-08-02 NOTE — ED Notes (Signed)
Admitting physician, Dr. Eulas Post, at bedside. Pulse ox 88% on room air. Oxygen reapplied at 2 LPM via nasal cannula. Pulse oximeter moved to L ear. Pulse ox 99-100% on oxygen. No respiratory distress noted.

## 2016-08-03 ENCOUNTER — Observation Stay (HOSPITAL_BASED_OUTPATIENT_CLINIC_OR_DEPARTMENT_OTHER): Payer: Medicare Other

## 2016-08-03 DIAGNOSIS — Z720 Tobacco use: Secondary | ICD-10-CM

## 2016-08-03 DIAGNOSIS — R079 Chest pain, unspecified: Secondary | ICD-10-CM

## 2016-08-03 DIAGNOSIS — F1010 Alcohol abuse, uncomplicated: Secondary | ICD-10-CM | POA: Diagnosis not present

## 2016-08-03 DIAGNOSIS — D696 Thrombocytopenia, unspecified: Secondary | ICD-10-CM | POA: Diagnosis not present

## 2016-08-03 DIAGNOSIS — E860 Dehydration: Secondary | ICD-10-CM

## 2016-08-03 DIAGNOSIS — R911 Solitary pulmonary nodule: Secondary | ICD-10-CM

## 2016-08-03 DIAGNOSIS — R0789 Other chest pain: Secondary | ICD-10-CM | POA: Diagnosis not present

## 2016-08-03 DIAGNOSIS — J441 Chronic obstructive pulmonary disease with (acute) exacerbation: Secondary | ICD-10-CM | POA: Diagnosis not present

## 2016-08-03 LAB — ECHOCARDIOGRAM COMPLETE
HEIGHTINCHES: 66 in
WEIGHTICAEL: 1836.8 [oz_av]

## 2016-08-03 LAB — CBC
HCT: 41.1 % (ref 39.0–52.0)
HEMOGLOBIN: 13.8 g/dL (ref 13.0–17.0)
MCH: 31.3 pg (ref 26.0–34.0)
MCHC: 33.6 g/dL (ref 30.0–36.0)
MCV: 93.2 fL (ref 78.0–100.0)
Platelets: 131 10*3/uL — ABNORMAL LOW (ref 150–400)
RBC: 4.41 MIL/uL (ref 4.22–5.81)
RDW: 15 % (ref 11.5–15.5)
WBC: 11.5 10*3/uL — ABNORMAL HIGH (ref 4.0–10.5)

## 2016-08-03 LAB — BASIC METABOLIC PANEL
Anion gap: 8 (ref 5–15)
BUN: 11 mg/dL (ref 6–20)
CALCIUM: 9.1 mg/dL (ref 8.9–10.3)
CHLORIDE: 101 mmol/L (ref 101–111)
CO2: 26 mmol/L (ref 22–32)
CREATININE: 0.83 mg/dL (ref 0.61–1.24)
GFR calc Af Amer: >60 mL/min (ref >60)
GFR calc non Af Amer: >60 mL/min (ref >60)
Glucose, Bld: 117 mg/dL — ABNORMAL HIGH (ref 65–99)
Potassium: 4.3 mmol/L (ref 3.5–5.1)
SODIUM: 135 mmol/L (ref 135–145)

## 2016-08-03 LAB — HEMOGLOBIN A1C
HEMOGLOBIN A1C: 5.5 % (ref 4.8–5.6)
Mean Plasma Glucose: 111 mg/dL

## 2016-08-03 LAB — URINE CULTURE: Culture: <10000 — AB

## 2016-08-03 LAB — MAGNESIUM: MAGNESIUM: 1.6 mg/dL — AB (ref 1.7–2.4)

## 2016-08-03 LAB — LACTIC ACID, PLASMA: Lactic Acid, Venous: 1.6 mmol/L (ref 0.5–1.9)

## 2016-08-03 MED ORDER — NICOTINE 21 MG/24HR TD PT24: 21.0000 mg | MEDICATED_PATCH | Freq: Every day | TRANSDERMAL | 0 refills | Status: DC

## 2016-08-03 MED ORDER — PREDNISONE 10 MG PO TABS: ORAL_TABLET | ORAL | 0 refills | Status: DC

## 2016-08-03 MED ORDER — MAGNESIUM SULFATE 2 GM/50ML IV SOLN
2.0000 g | Freq: Once | INTRAVENOUS | Status: AC
  Administered 2016-08-03: 2 g via INTRAVENOUS
  Filled 2016-08-03: qty 50

## 2016-08-03 MED ORDER — FOLIC ACID 1 MG PO TABS: 1.0000 mg | ORAL_TABLET | Freq: Every day | ORAL | 0 refills | Status: DC

## 2016-08-03 MED ORDER — ADULT MULTIVITAMIN W/MINERALS CH: 1.0000 | ORAL_TABLET | Freq: Every day | ORAL | 0 refills | Status: DC

## 2016-08-03 NOTE — Care Management Obs Status (Signed)
[place] NOTIFICATION   Patient Details  Name: George [name] MRN: [id number] Date of Birth: 30-Jan-1941   Medicare Observation Status Notification Given:  Yes    Royston Bake, RN 08/03/2016, 3:31 PM

## 2016-08-03 NOTE — Discharge Summary (Signed)
Physician Discharge Summary  George [name] XTK:[id number] DOB: July 11, 1941 DOA: 08/02/2016  PCP: Vonna Drafts, FNP  Admit date: 08/02/2016 Discharge date: 08/03/2016  Time spent: 45 minutes  Recommendations for Outpatient Follow-up:  Patient will be discharged to home.  Patient will need to follow up with primary care provider within one week of discharge, repeat BMP and magnesium.  Follow up with pulmonologist at the [facility]. Will need repeat CT chest in 3 months.  Patient should continue medications as prescribed.  Patient should follow a heart healthy diet.   Discharge Diagnoses:  Chest pain COPD exacerbation, mild/acute respiratory insufficiency with hypoxia Pulmonary nodule Hypokalemia Lactic acidosis Active EtOH and tobacco use  Discharge Condition: Stable  Diet recommendation: heart healthy  Filed Weights   08/02/16 0625 08/02/16 0626 08/03/16 0620  Weight: 51.8 kg (114 lb 3.2 oz) 53.5 kg (118 lb) 52.1 kg (114 lb 12.8 oz)    History of present illness:  on 08/02/2016 by Dr. Matilde Bash [name] a 75 y.o.gentleman with a history of HTN (not taking anti-hypertensives at this time) and prostate cancer, followed at the [facility], who presents to the ED tonight complaining of chest pain and tightness. The patient reports the onset of symptoms around midnight, while watching TV. Chest tightness associated with shortness of breath and headache. No nausea, vomiting, or diaphoresis He took an OTC pain medication that did not improve symptoms. He says that he has an intolerance to Aspirin (it causes upset stomach but he denies history of GI bleed or ulcer disease). He called 911. Per reports, the patient was congested on lung exam, and he received IV solumedrol and a nebulizer treatment. The patient says that he was chest pain free upon arrival to the ED.  Hospital Course:  Chest pain -Appears to have resolved, per patient -Troponin cycled and  unremarkable -Echocardiogram EF 9924%, grade 1 diastolic dysfunction -Aspirin held due to reported intolerance and thrombocytopenia -hemoglobin A1c 5.5 -Lipid panel: TC 150, TG 54, HDL 55, LDL 84  COPD exacerbation, mild/acute respiratory insufficiency with hypoxia -Emphysematous changes chest xray explained to patient. He does not have a prior history of COPD. -Patient noted to have SPO2 of 88% -CTA chest: Not stated her pulmonary embolism, diffuse emphysematous changes throughout the lungs -Clinically improved tonight with steroids and nebulizer treatment. Continue prednisone '60mg'$  po daily for now. -Weaned off of O2, currently oxygen sats in the mid 90s on room air -Currently no wheezing -Continue prednisone taper -Patient will need to follow-up with pulmonology at the [facility] upon discharge  Pulmonary nodule -Noted on CT chest, 1.5 x 0.9 cm -Will need repeat CT in 3 months  Hypokalemia -Resolved -Magnesium 1.6, replaced -Repeat BMP and magnesium in one week  Lactic acidosis -Resolved -Lactic increased to 5.9, currently 16 -Suspect due to dehydration and alcohol intake -Nothing by mouth IV fluids, monitor lactic acid -UA and chest x-ray unremarkable for infection, patient afebrile with no leukocytosis  Active EtOH and tobacco use -Was placed on CIWA, continue multivitamin, folic acid, thiamine -Nicotine patch -Urine tox screen negative -Alcohol level 10 on admission -No signs of withdrawal  Consultants none  Procedures  Echocardiogram  Discharge Exam: Vitals:   08/03/16 0620 08/03/16 1204  BP: 122/73 137/69  Pulse: 88 82  Resp: 20 18  Temp: 98.2 F (36.8 C) 98 F (36.7 C)   Exam  General: Well developed, thin, cachetic, NAD  HEENT: NCAT, mucous membranes moist.   Cardiovascular: S1 S2 auscultated, RRR  Respiratory: Clear  to auscultation bilaterally with equal chest rise, no wheezing  Abdomen: Soft, nontender, nondistended, + bowel  sounds  Extremities: warm dry without cyanosis clubbing or edema  Neuro: AAOx3,nonfocal  Psych: Appropriate mood and affect  Discharge Instructions Discharge Instructions    Discharge instructions    Complete by:  As directed   Patient will be discharged to home.  Patient will need to follow up with primary care provider within one week of discharge, repeat BMP and magnesium.  Follow up with pulmonologist at the [facility]. Will need repeat CT chest in 3 months.  Patient should continue medications as prescribed.  Patient should follow a heart healthy diet.     Current Discharge Medication List    START taking these medications   Details  folic acid (FOLVITE) 1 MG tablet Take 1 tablet (1 mg total) by mouth daily. Qty: 30 tablet, Refills: 0    Multiple Vitamin (MULTIVITAMIN WITH MINERALS) TABS tablet Take 1 tablet by mouth daily. Qty: 30 tablet, Refills: 0    nicotine (NICODERM CQ - DOSED IN MG/24 HOURS) 21 mg/24hr patch Place 1 patch (21 mg total) onto the skin daily. Qty: 28 patch, Refills: 0    predniSONE (DELTASONE) 10 MG tablet Take '40mg'$  (4 tabs) x 2 days, then taper to '30mg'$  (3 tabs) x 2 days, then '20mg'$  (2 tabs) x 2 days, then '10mg'$  (1 tab) x 2days Qty: 20 tablet, Refills: 0      STOP taking these medications     HYDROcodone-acetaminophen (NORCO/VICODIN) 5-325 MG per tablet        Allergies  Allergen Reactions  . Aspirin    Follow-up Information    Vonna Drafts, FNP. Schedule an appointment as soon as possible for a visit today.   Specialty:  Nurse Practitioner Why:  Hospital follow up Contact information: [place] [address] [phone]            The results of significant diagnostics from this hospitalization (including imaging, microbiology, ancillary and laboratory) are listed below for reference.    Significant Diagnostic Studies: Dg Chest 2 View  Result Date: 08/02/2016 CLINICAL DATA:  Chest pain and tightness tonight.  Hypertension. Smoker. EXAM: CHEST  2 VIEW COMPARISON:  04/16/2012 FINDINGS: Normal heart size and pulmonary vascularity. Diffuse emphysematous changes and scattered fibrosis in the lungs. No focal airspace disease or consolidation. No blunting of costophrenic angles. No pneumothorax. Mediastinal contours appear intact. Calcification of the aorta. IMPRESSION: Emphysematous changes in the lungs. No evidence of active pulmonary disease. Electronically Signed   By: Lucienne Capers M.D.   On: 08/02/2016 01:43   Ct Angio Chest Pe W And/or Wo Contrast  Result Date: 08/02/2016 CLINICAL DATA:  Intermittent chest pain since 01/26/1939 5 hours. Headaches and mild bilateral lower extremity swelling. History of prostate cancer and hypertension. Shortness of breath, hypoxia, and weight loss. Smoker. Tachycardia. EXAM: CT ANGIOGRAPHY CHEST WITH CONTRAST TECHNIQUE: Multidetector CT imaging of the chest was performed using the standard protocol during bolus administration of intravenous contrast. Multiplanar CT image reconstructions and MIPs were obtained to evaluate the vascular anatomy. CONTRAST:  100 mL Isovue 370 COMPARISON:  04/05/2013 FINDINGS: Technically adequate study with good opacification of the central and segmental pulmonary arteries. No focal filling defects demonstrated. No evidence of significant pulmonary embolus. Normal heart size. Normal caliber thoracic aorta. Scattered aortic calcifications. No aortic dissection. Great vessel origins are patent. Esophagus is decompressed. No significant lymphadenopathy in the chest. Prominent diffuse emphysematous changes throughout the  lungs with scattered fibrosis. Focal nodular infiltration in the right upper pole lung measuring 1.5 x 0.9 cm. This was not present on prior study and could represent inflammatory focus but a malignant nodule is not excluded. 3 mm nodule in the right middle lung, not seen previously. 5 mm nodule in the superior segment right lower lobe  which was present previously and is therefore likely benign. No pneumothorax. No pleural effusions. Debris within the trachea likely representing mucoid material. Mild bronchial wall thickening centrally probably representing chronic bronchitic change. Included portions of the upper abdominal organs are grossly unremarkable. Degenerative changes in spine. No destructive bone lesions. Review of the MIP images confirms the above findings. IMPRESSION: No evidence of significant pulmonary embolus. Diffuse emphysematous changes throughout the lungs. **An incidental finding of potential clinical significance has been found. 1.5 x 0.9 cm nodular lesion in the right upper lung and additional smaller nodules in the right lung. Non-contrast chest CT at 3 months is recommended. If nodules persist, subsequent management will be based upon the most suspicious nodule(s). This recommendation follows the consensus statement: Guidelines for Management of Incidental Pulmonary Nodules Detected on CT Images:From the Fleischner Society 2017; published online before print (10.1148/radiol.[id number]).** Electronically Signed   By: Lucienne Capers M.D.   On: 08/02/2016 03:26    Microbiology: No results found for this or any previous visit (from the past 240 hour(s)).   Labs: Basic Metabolic Panel:  Recent Labs Lab 08/02/16 0050 08/03/16 0504  NA 133* 135  K 3.4* 4.3  CL 99* 101  CO2 25 26  GLUCOSE 119* 117*  BUN 7 11  CREATININE 0.84 0.83  CALCIUM 9.2 9.1  MG  --  1.6*   Liver Function Tests: No results for input(s): AST, ALT, ALKPHOS, BILITOT, PROT, ALBUMIN in the last 168 hours. No results for input(s): LIPASE, AMYLASE in the last 168 hours. No results for input(s): AMMONIA in the last 168 hours. CBC:  Recent Labs Lab 08/02/16 0050 08/03/16 0504  WBC 6.0 11.5*  HGB 15.4 13.8  HCT 46.2 41.1  MCV 93.5 93.2  PLT 121* 131*   Cardiac Enzymes:  Recent Labs Lab 08/02/16 0512 08/02/16 0646  08/02/16 0929 08/02/16 1143  TROPONINI <0.03 <0.03 <0.03 <0.03   BNP: BNP (last 3 results) No results for input(s): BNP in the last 8760 hours.  ProBNP (last 3 results) No results for input(s): PROBNP in the last 8760 hours.  CBG: No results for input(s): GLUCAP in the last 168 hours.     SignedCristal Ford  [facility] 08/03/2016, 3:47 PM

## 2016-08-03 NOTE — Discharge Instructions (Signed)

## 2016-08-03 NOTE — Progress Notes (Signed)
Patient alert and orient, Discharge paperwork given, Waiting on family to transport

## 2016-08-03 NOTE — Progress Notes (Signed)
Echocardiogram 2D Echocardiogram has been performed.  George [name] 08/03/2016, 2:07 PM

## 2020-02-01 ENCOUNTER — Ambulatory Visit: Payer: Medicare Other | Attending: Internal Medicine

## 2020-02-01 DIAGNOSIS — Z23 Encounter for immunization: Secondary | ICD-10-CM | POA: Insufficient documentation

## 2020-02-01 NOTE — Progress Notes (Signed)
   Covid-19 Vaccination Clinic  Name:  George [name]    MRN: [id number] DOB: 1941-02-26  02/01/2020  George [name] was observed post Covid-19 immunization for 15 minutes without incidence. He was provided with Vaccine Information Sheet and instruction to access the V-Safe system.   George [name] was instructed to call 911 with any severe reactions post vaccine: Marland Kitchen Difficulty breathing  . Swelling of your face and throat  . A fast heartbeat  . A bad rash all over your body  . Dizziness and weakness    Immunizations Administered    Name Date Dose VIS Date Route   Pfizer COVID-19 Vaccine 02/01/2020  2:00 PM 0.3 mL 11/17/2019 Intramuscular   Manufacturer: [place]   Lot: [id number]   [place]: [id number]

## 2020-02-27 ENCOUNTER — Ambulatory Visit: Payer: Medicare Other | Attending: Internal Medicine

## 2020-02-27 DIAGNOSIS — Z23 Encounter for immunization: Secondary | ICD-10-CM

## 2020-02-27 NOTE — Progress Notes (Signed)
   Covid-19 Vaccination Clinic  Name:  ARHAM SYMMONDS    MRN: [id number] DOB: 03-Sep-1941  02/27/2020  Mr. Umphlett was observed post Covid-19 immunization for 15 minutes without incident. He was provided with Vaccine Information Sheet and instruction to access the V-Safe system.   Mr. Mucha was instructed to call 911 with any severe reactions post vaccine: Marland Kitchen Difficulty breathing  . Swelling of face and throat  . A fast heartbeat  . A bad rash all over body  . Dizziness and weakness   Immunizations Administered    Name Date Dose VIS Date Route   Pfizer COVID-19 Vaccine 02/27/2020  9:10 AM 0.3 mL 11/17/2019 Intramuscular   Manufacturer: [place]   Lot: [id number]   [place]: [id number]

## 2020-04-02 NOTE — Progress Notes (Signed)
Thoracic Location of Tumor / Histology:  RIGHT upper lung  Presented to the [facility] the first of March unable to breath. Reports a predominately dry cough. Also, family had begun to notice weight loss.   Biopsies revealed: 03/27/2020  Tobacco/Marijuana/Snuff/ETOH use: 2 ppd smoker x 60 years stopped a few weeks ago   Past/Anticipated interventions by cardiothoracic surgery, if any: denies  Past/Anticipated interventions by medical oncology, if any: denies no upcoming medical oncology appointments noted  Signs/Symptoms  Weight changes, if any: patient weighed 114 approximately 1 year ago weight today 93.6 patient down 2 lb since last doctors appt a few weeks ago  Respiratory complaints, if any: reports occasional dry cough. Reports shortness of breath. Reports a sharp pain about his sternum when taking a deep breath in.  Hemoptysis, if any: denies  Pain issues, if any:  denies  SAFETY ISSUES:  Prior radiation? Yes for prostate cancer  Pacemaker/ICD? denies   Possible current pregnancy? N/A  Is the patient on methotrexate? denies  Current Complaints / other details:  79 year old. Lives alone. Denies any supportive needs.   Denies headache, dizziness, nausea or vomiting. Denies diplopia or tinnitus. Denies bowel or bladder complaints. Reports feeling unsteady on his feet.

## 2020-04-03 NOTE — Progress Notes (Signed)
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 male who is accompanied by his niece. The patient presented to the [place] in on 02/16/2020 with complaints of cough and shortness of breath. Chest x-ray at that time showed a right hilar mass. CT of chest on that same day revealed a large right upper lobe mass with a central area of cavitation in an air-fluid level. The finding was suspicious for a primary lung malignancy such as squamous cell carcinoma until proven otherwise. It also should a prominent amount of mucus versus aspirated material in the trachea and central airways with likely inflammatory nodules secondary to infectious inflammatory bronchitis. Finally, there was a severe compression fracture deformity at T9.  The patient underwent a biopsy by Dr. Gwenette Greet at the [facility] on 03/07/2020 that showed well differentiated squamous cell carcinoma and necrosis with marked acute inflammation of the right upper lobe. Three lymph nodes were negative for metastatic carcinoma.  MRI of brain on 03/22/2020 did not show any evidence of intracranial metastatic disease.  Patient proceeded to undergo a PET scan at [facility] which showed a 6.8 x 4.9 cm anterior right upper lobe mass.  SUV was 24.3.  In addition there was 2 x 1.5 precarinal lymph node with SUV of 5 and suspicious involvement along the ipsilateral and contralateral hilar area.  In addition there was a possible lesion in the left femoral shaft with an SUV of 1.8 cm  With this information the patient is now referred from the [place] hospital  to [facility] to be considered for radiation therapy  PREVIOUS RADIATION THERAPY: Yes patient has a history of prostate cancer and was treated apparently with 5 weeks of external beam radiation therapy and a radiation seed implant.  I have requested details from his previous treatment.  Per discussion with patient his prostate cancer is in control.  PAST MEDICAL HISTORY:  Past Medical History:  Diagnosis Date  . Cancer [facility]) Prostate  . Hypertension     PAST SURGICAL HISTORY:History reviewed. No pertinent surgical history.  FAMILY HISTORY:  Family History  Problem Relation Age of Onset  . Breast cancer Sister   . Cancer Paternal Uncle     SOCIAL HISTORY:  Social History   Tobacco Use  . Smoking status: Former Smoker    Packs/day: 2.00    Years: 60.00    Pack years: 120.00    Types: Cigarettes    Quit date: 03/21/2020    Years since quitting: 0.0  . Smokeless tobacco: Never Used  Substance Use Topics  . Alcohol use: Yes  . Drug use: No    ALLERGIES:  Allergies  Allergen Reactions  . Aspirin     Stomach upset    MEDICATIONS:  Current Outpatient Medications  Medication Sig Dispense Refill  . POTASSIUM PO Take by mouth.    . Iron Combinations (IRON COMPLEX PO) Take by mouth.     No current facility-administered medications for this encounter.    REVIEW OF SYSTEMS:  A 10+ POINT REVIEW OF SYSTEMS WAS OBTAINED including neurology, dermatology, psychiatry, cardiac, respiratory, lymph, extremities, GI, GU,  musculoskeletal, constitutional, reproductive, HEENT.  Reports dyspnea with exertion.  He denies any headaches dizziness or blurred vision.  Patient denies any pain within the chest area significant cough or hemoptysis.  Reports a good appetite but has lost approximately 30 pounds over the past few months.  He reports taking in 2 cans of Ensure per day plus regular foods.  He denies any significant pain in this left upper leg.  He has significantly decreased his cigarette  consumption but occasionally will smoke a cigarette.   PHYSICAL EXAM:  height is 5' 6.5" (1.689 m) and weight is 93 lb 9.6 oz (42.5 kg). His temperature is 98.7 F (37.1 C). His blood pressure is 128/62 and his pulse is 92. His respiration is 18 and oxygen saturation is 96%.   General: Alert and oriented, in no acute distress, very pleasant, emaciated appearance, remains in wheelchair for examination HEENT: Head is normocephalic. Extraocular movements are intact.  Neck: Neck is supple, no palpable cervical or supraclavicular lymphadenopathy. Heart: Regular in rate and rhythm with no murmurs, rubs, or gallops. Chest: Clear to auscultation bilaterally, with no rhonchi, wheezes, or rales. Abdomen: Soft, nontender, nondistended, with no rigidity or guarding. Extremities: No cyanosis or edema. Lymphatics: see Neck Exam Skin: No concerning lesions. Musculoskeletal: symmetric strength and muscle tone throughout. Neurologic: Cranial nerves II through XII are grossly intact. No obvious focalities. Speech is fluent. Coordination is intact. Psychiatric: Judgment and insight are intact. Affect is appropriate.   ECOG = 1  0 - Asymptomatic (Fully active, able to carry on all predisease activities without restriction)  1 - Symptomatic but completely ambulatory (Restricted in physically strenuous activity but ambulatory and able to carry out work of a light or sedentary nature. For example, light housework, office work)  2 - Symptomatic, <50% in bed during the day (Ambulatory and capable of all self care but unable to carry out any work activities. Up and about more than 50% of waking hours)  3 - Symptomatic, >50% in bed, but not bedbound (Capable of only limited self-care, confined to bed or chair 50% or more of waking hours)  4 - Bedbound (Completely disabled. Cannot carry on any self-care. Totally confined to bed or chair)  5 - Death   Eustace Pen MM, Creech RH, Tormey DC, et al. [phone]). "Toxicity and  response criteria of the [facility]". Effort Oncol. 5 (6): 649-55  LABORATORY DATA:  Lab Results  Component Value Date   WBC 11.5 (H) 08/03/2016   HGB 13.8 08/03/2016   HCT 41.1 08/03/2016   MCV 93.2 08/03/2016   PLT 131 (L) 08/03/2016   NEUTROABS 6.3 11/06/2013   Lab Results  Component Value Date   NA 135 08/03/2016   K 4.3 08/03/2016   CL 101 08/03/2016   CO2 26 08/03/2016   GLUCOSE 117 (H) 08/03/2016   CREATININE 0.83 08/03/2016   CALCIUM 9.1 08/03/2016      RADIOGRAPHY: No results found.    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  States 3 cm versus stage IV.  Patient will be a candidate for radiation therapy.  Assuming there is no metastatic spread to the left proximal femur then he would be a candidate for definitive course of radiation therapy.  Will consult medical oncology to see if he would be a candidate for radiosensitizing chemotherapy and for consideration for additional systemic therapy at a later date.  Patient will be  scheduled for CT simulation next week.  We will scan the chest and at the same time scan the left femur to see if there are suspicious changes in this area.  If findings are suspicious for metastatic disease to the left femur we will proceed with a palliative course of radiation therapy to this area as well as to the chest region.  If significant involvement of the left femur is noted then will need to consider orthopedic consultation.  Today, I talked to the patient and family about the findings and work-up thus far.  We discussed the natural history of non-small cell carcinoma and general treatment, highlighting the role of radiotherapy in the management.  We discussed the available radiation techniques, and focused on the details of logistics and delivery.  We reviewed the anticipated acute and late sequelae  associated with radiation in this setting.  The patient was encouraged to ask questions that I answered to the best of my ability.  A patient consent form was discussed and signed.  We retained a copy for our records.  The patient would like to proceed with radiation and will be scheduled for CT simulation.  PLAN: CT simulation next week.  Medical oncology consultation    ------------------------------------------------  Blair Promise, PhD, MD  This document serves as a record of services personally performed by Gery Pray, MD. It was created on his behalf by Clerance Lav, a trained medical scribe. The creation of this record is based on the scribe's personal observations and the provider's statements to them. This document has been checked and approved by the attending provider.

## 2020-04-04 ENCOUNTER — Other Ambulatory Visit: Payer: Self-pay

## 2020-04-04 ENCOUNTER — Ambulatory Visit
Admission: RE | Admit: 2020-04-04 | Discharge: 2020-04-04 | Disposition: A | Discharge status: Home / Self Care | Payer: Medicare Other | Source: Ambulatory Visit | Attending: Radiation Oncology | Admitting: Radiation Oncology

## 2020-04-04 ENCOUNTER — Encounter: Payer: Self-pay | Admitting: Radiation Oncology

## 2020-04-04 VITALS — BP 128/62 | HR 92 | Temp 98.7°F | Resp 18 | Ht 66.5 in | Wt 93.6 lb

## 2020-04-04 DIAGNOSIS — I10 Essential (primary) hypertension: Secondary | ICD-10-CM | POA: Insufficient documentation

## 2020-04-04 DIAGNOSIS — Z8546 Personal history of malignant neoplasm of prostate: Secondary | ICD-10-CM | POA: Insufficient documentation

## 2020-04-04 DIAGNOSIS — Z923 Personal history of irradiation: Secondary | ICD-10-CM | POA: Insufficient documentation

## 2020-04-04 DIAGNOSIS — C3411 Malignant neoplasm of upper lobe, right bronchus or lung: Secondary | ICD-10-CM | POA: Diagnosis present

## 2020-04-04 DIAGNOSIS — Z87891 Personal history of nicotine dependence: Secondary | ICD-10-CM | POA: Insufficient documentation

## 2020-04-04 DIAGNOSIS — R911 Solitary pulmonary nodule: Secondary | ICD-10-CM

## 2020-04-05 ENCOUNTER — Other Ambulatory Visit: Payer: Self-pay | Admitting: Radiation Oncology

## 2020-04-05 ENCOUNTER — Ambulatory Visit
Admission: RE | Admit: 2020-04-05 | Discharge: 2020-04-05 | Disposition: A | Discharge status: Home / Self Care | Payer: Self-pay | Source: Ambulatory Visit | Attending: Radiation Oncology | Admitting: Radiation Oncology

## 2020-04-05 ENCOUNTER — Telehealth: Payer: Self-pay | Admitting: *Deleted

## 2020-04-05 ENCOUNTER — Encounter: Payer: Self-pay | Admitting: *Deleted

## 2020-04-05 DIAGNOSIS — C3490 Malignant neoplasm of unspecified part of unspecified bronchus or lung: Secondary | ICD-10-CM

## 2020-04-05 DIAGNOSIS — C3411 Malignant neoplasm of upper lobe, right bronchus or lung: Secondary | ICD-10-CM

## 2020-04-05 NOTE — Progress Notes (Signed)
[place] [phone]   [phone]  CONSULT NOTE  REFERRING PHYSICIAN: Dr. Sondra Come  REASON FOR CONSULTATION:  Non-small cell lung cancer, squamous cell carcinoma.  HPI George [name] is a 79 y.o. male with a past medical history significant for COPD, hx of prostate cancer in 2012, alcohol abuse, and aspiration pneumonia was referred to the clinic for evaluation of newly diagnosed non-small cell lung cancer, squamous cell carcinoma.  The patient was first diagnosed after presenting to the [place] for the chief complaint of cough and shortness of breath on 02/16/2020.  The patient had a chest x-ray performed which showed a right hilar mass.  The patient then had a CT scan of the chest that same day which noted a large right upper lobe lung mass with central cavitation and air-fluid level which is suspicious for squamous cell carcinoma.  The patient underwent a biopsy under the care of Dr. Gwenette Greet on 03/07/2020 which was consistent with squamous cell carcinoma.  The patient had a brain MRI as part of the staging work-up which was negative for any evidence of metastatic disease to the brain.  That patient had a PET scan performed at [facility] which noted a 6.8 x 4.9 cm right upper lobe mass with an SUV of 24.3.  The scan also noted precardial lymphadenopathy which was 2 x 1.5 cm as well as ipsilateral and contralateral hilar lymphadenopathy.  The scan also noted a possible faint area of hypermetabolism on the left femoral shaft.  Of note, the patient also has a history of prostate cancer which was diagnosed several years ago. Per chart review, it appears this was in 2012. The patient was followed by Dr. Karsten Ro from urology and Dr. Sondra Come from radiation oncology. The patient completed 5 weeks of external beam radiation as well as radiation seed implants.   The patient recently had a re-consultation with radiation oncologist, Dr. Sondra Come, on 04/04/2020 who saw the patient for  consideration of radiosensitizing chemoradiation if consistent with stage IIIc.  Dr. Sondra Come is planning on scanning the femur and if the lesion on the femur is suspicious for metastasis, Dr. Sondra Come will consider palliative radiotherapy and may consider an orthopedic consultation.  The patient is scheduled for his CT simulation on 5/5.  Today, the patient is feeling fairly well without any concerning complaints except for continued dyspnea on exertion and dry cough. He also notes 30 lb weight loss over the last year.  He denies any fever, chills, or night sweats.  He denies any hemoptysis.  He denies any nausea, vomiting, diarrhea, or constipation.  He denies any headache or visual changes.   The patient's family history consists of a mother who is deceased.  Her medical history is unknown.  The patient's father had pneumonia.  The patient sister had breast cancer.  And the patient's niece has diabetes mellitus.  The patient denies any other family history of any other malignancies.  The patient is a former Curator.  He is not married and does not have any children.  The patient used to drink approximately 1-2 alcoholic beverages per day but he quit drinking alcohol 6 months ago.  The patient denies any history of drug abuse.  The patient is a former smoker, having smoked approximately 1 pack/day for about 60 years.  The patient quit smoking approximately 6 weeks ago.  The patient lives at home alone and does not drive.  The patient has some family members that live locally to help  him get to his appointments.   HPI  Past Medical History:  Diagnosis Date  . Cancer [facility]) Prostate  . Hypertension     No past surgical history on file.  Family History  Problem Relation Age of Onset  . Breast cancer Sister   . Cancer Paternal Uncle     Social History Social History   Tobacco Use  . Smoking status: Former Smoker    Packs/day: 2.00    Years: 60.00    Pack years: 120.00    Types: Cigarettes     Quit date: 03/21/2020    Years since quitting: 0.0  . Smokeless tobacco: Never Used  Substance Use Topics  . Alcohol use: Yes  . Drug use: No    Allergies  Allergen Reactions  . Aspirin     Stomach upset    Current Outpatient Medications  Medication Sig Dispense Refill  . Iron Combinations (IRON COMPLEX PO) Take by mouth.    Marland Kitchen POTASSIUM PO Take by mouth.     No current facility-administered medications for this visit.    Review of Systems REVIEW OF SYSTEMS:   Review of Systems  Constitutional: Positive for weight loss.  Negative for appetite change, chills, fatigue, and fever. HENT: Negative for mouth sores, nosebleeds, sore throat and trouble swallowing.   Eyes: Negative for eye problems and icterus.  Respiratory: Positive for dyspnea on exertion and dry cough.  Negative for hemoptysis and wheezing.   Cardiovascular: Negative for chest pain and leg swelling.  Gastrointestinal: Negative for abdominal pain, constipation, diarrhea, nausea and vomiting.  Genitourinary: Negative for bladder incontinence, difficulty urinating, dysuria, frequency and hematuria.   Musculoskeletal: Negative for back pain, gait problem, neck pain and neck stiffness.  Skin: Negative for itching and rash.  Neurological: Negative for dizziness, extremity weakness, gait problem, headaches, light-headedness and seizures.  Hematological: Negative for adenopathy. Does not bruise/bleed easily.  Psychiatric/Behavioral: Negative for confusion, depression and sleep disturbance. The patient is not nervous/anxious.     PHYSICAL EXAMINATION:  There were no vitals taken for this visit.  ECOG PERFORMANCE STATUS: 2  Physical Exam  Constitutional: Oriented to person, place, and time and cachectic appearing male and in no distress.  HENT:  Head: Normocephalic and atraumatic.  Mouth/Throat: Oropharynx is clear and moist. No oropharyngeal exudate.  Eyes: Conjunctivae are normal. Right eye exhibits no discharge.  Left eye exhibits no discharge. No scleral icterus.  Neck: Normal range of motion. Neck supple.  Cardiovascular: Normal rate, regular rhythm, normal heart sounds and intact distal pulses.   Pulmonary/Chest: Effort normal.  Decreased breath sounds in all lung fields.  No respiratory distress. No wheezes. No rales.  Abdominal: Soft. Bowel sounds are normal. Exhibits no distension and no mass. There is no tenderness.  Musculoskeletal: Normal range of motion. Exhibits no edema.  Lymphadenopathy:    No cervical adenopathy.  Neurological: Alert and oriented to person, place, and time. Exhibits normal muscle tone. Gait normal. Coordination normal.  Skin: Skin is warm and dry. No rash noted. Not diaphoretic. No erythema. No pallor.  Psychiatric: Mood, memory and judgment normal.  Vitals reviewed.  LABORATORY DATA: Lab Results  Component Value Date   WBC 11.5 (H) 08/03/2016   HGB 13.8 08/03/2016   HCT 41.1 08/03/2016   MCV 93.2 08/03/2016   PLT 131 (L) 08/03/2016      Chemistry      Component Value Date/Time   NA 135 08/03/2016 0504   K 4.3 08/03/2016 0504   CL 101 08/03/2016  0504   CO2 26 08/03/2016 0504   BUN 11 08/03/2016 0504   CREATININE 0.83 08/03/2016 0504      Component Value Date/Time   CALCIUM 9.1 08/03/2016 0504   ALKPHOS 92 06/01/2011 0904   AST 30 06/01/2011 0904   ALT 15 06/01/2011 0904   BILITOT 0.5 06/01/2011 0904       RADIOGRAPHIC STUDIES: No results found.  ASSESSMENT: This is a very pleasant 79 year old African American male diagnosed with stage IIIC/IV non-small cell lung cancer, squamous cell carcinoma.  He presented with a right upper lobe mass precardial lymphadenopathy and ipsilateral and contralateral hilar lymphadenopathy.  He also has a faint area of hypermetabolic activity left femoral shaft.  He was diagnosed in April 2021.   PLAN: The patient was seen with Dr. Julien Nordmann today.  Dr. Julien Nordmann had a lengthy discussion with the patient about his  current condition and treatment options.  Dr. Julien Nordmann recommends that the patient undergo concurrent chemoradiation with carboplatin for an AUC of 2 and paclitaxel 45 mg per metered squared.  The patient is interested in in this option and he is expected to receive his first dose on 04/15/2020.  The adverse side effects of treatment were discussed including but not limited to myelosuppression, alopecia, nausea, vomiting, liver, and renal dysfunction.  I will arrange for the patient to have a chemo education class prior to starting his first cycle of treatment.  I have sent a prescription for Compazine 10 mg p.o. every 6 hours as needed for nausea to the patient's pharmacy.  I will refer the patient to the dietitian regarding his weight loss and for nutrition.  I will also refer the patient to social work for further assessment of his needs.  The patient is unable to drive and relies on his family for transportation to his appointments as well as food delivery.  We will see the patient back for follow-up visit in 2 weeks with cycle #2 of his treatment.  The patient voices understanding of current disease status and treatment options and is in agreement with the current care plan.  All questions were answered. The patient knows to call the clinic with any problems, questions or concerns. We can certainly see the patient much sooner if necessary.  Thank you so much for allowing me to participate in the care of [employer]. I will continue to follow up the patient with you and assist in his care.  The total time spent in the appointment was 80 minutes.  Disclaimer: This note was dictated with voice recognition software. Similar sounding words can inadvertently be transcribed and may not be corrected upon review.   Santiago Graf L Karis Emig April 05, 2020, 3:39 PM   ADDENDUM: Hematology/Oncology Attending: I had a face-to-face encounter with the patient today.  I recommended his care plan.   This is a very pleasant 79 years old African-American male recently diagnosed with stage IIIc/IV (T4, N3, M0/M1 C) non-small cell lung cancer, squamous cell carcinoma presented with large cavitary right upper lobe lung mass in addition to mediastinal and bilateral hilar lymphadenopathy and suspicious bone lesion in the left femoral shaft. I had a lengthy discussion with the patient today about his current condition and treatment options. The lesion in the left humeral shaft is not clearly confirmed to be metastatic lesion from lung cancer especially in this patient with a history of prostate cancer.  We will check his PSA. I recommended for the patient a course of concurrent chemoradiation  with weekly carboplatin for AUC of 2 and paclitaxel 45 NG/M2 for 6-7 weeks.  This will be followed by immunotherapy if the patient has no evidence for disease progression after the induction phase. I discussed with the patient the adverse effect of this treatment including but not limited to alopecia, myelosuppression, nausea and vomiting, peripheral neuropathy, liver or renal dysfunction. He is expected to start the first cycle of this treatment next week. We will arrange for the patient to have a chemotherapy education class before the first dose of his treatment. We will call his pharmacy with prescription for Compazine 10 mg p.o. every 6 hours as needed for nausea. The patient may also benefit from a short course of palliative therapy to the suspicious left femoral lesion. The patient will come back for follow-up visit in 2 weeks for evaluation and management of any adverse effect of his treatment. He was advised to call immediately if he has any concerning symptoms in the interval.  Disclaimer: This note was dictated with voice recognition software. Similar sounding words can inadvertently be transcribed and may be missed upon review. Eilleen Kempf, MD 04/08/20

## 2020-04-05 NOTE — Telephone Encounter (Signed)
Oncology Nurse Navigator Documentation  Oncology Nurse Navigator Flowsheets 04/05/2020  Navigator Location [facility]  Referral Date to RadOnc/MedOnc 04/05/2020  Navigator Encounter Type Telephone/I received a new patient referral today.  I called and spoke with his wife. I gave her the appt.  She verbalized understanding.   Telephone Outgoing Call  Treatment Phase Pre-Tx/Tx Discussion  Barriers/Navigation Needs Coordination of Care;Education  Education Other  Interventions Coordination of Care;Education  Acuity Level 2-Minimal Needs (1-2 Barriers Identified)  Coordination of Care Appts  Time Spent with Patient 30

## 2020-04-08 ENCOUNTER — Inpatient Hospital Stay: Payer: No Typology Code available for payment source

## 2020-04-08 ENCOUNTER — Inpatient Hospital Stay: Payer: No Typology Code available for payment source | Admitting: Physician Assistant

## 2020-04-08 ENCOUNTER — Other Ambulatory Visit: Payer: Self-pay | Admitting: Internal Medicine

## 2020-04-08 ENCOUNTER — Other Ambulatory Visit: Payer: Self-pay

## 2020-04-08 ENCOUNTER — Encounter: Payer: Self-pay | Admitting: Physician Assistant

## 2020-04-08 VITALS — BP 136/67 | HR 95 | Temp 98.9°F | Ht 66.5 in | Wt 93.0 lb

## 2020-04-08 DIAGNOSIS — Z5111 Encounter for antineoplastic chemotherapy: Secondary | ICD-10-CM

## 2020-04-08 DIAGNOSIS — Z79899 Other long term (current) drug therapy: Secondary | ICD-10-CM | POA: Diagnosis not present

## 2020-04-08 DIAGNOSIS — C3411 Malignant neoplasm of upper lobe, right bronchus or lung: Secondary | ICD-10-CM | POA: Diagnosis not present

## 2020-04-08 DIAGNOSIS — Z7189 Other specified counseling: Secondary | ICD-10-CM | POA: Diagnosis not present

## 2020-04-08 LAB — CBC WITH DIFFERENTIAL (CANCER CENTER ONLY)
Abs Immature Granulocytes: 0.03 10*3/uL (ref 0.00–0.07)
Basophils Absolute: 0 10*3/uL (ref 0.0–0.1)
Basophils Relative: 0 %
Eosinophils Absolute: 0 10*3/uL (ref 0.0–0.5)
Eosinophils Relative: 0 %
HCT: 34.6 % — ABNORMAL LOW (ref 39.0–52.0)
Hemoglobin: 10.4 g/dL — ABNORMAL LOW (ref 13.0–17.0)
Immature Granulocytes: 0 %
Lymphocytes Relative: 15 %
Lymphs Abs: 1.3 10*3/uL (ref 0.7–4.0)
MCH: 25.7 pg — ABNORMAL LOW (ref 26.0–34.0)
MCHC: 30.1 g/dL (ref 30.0–36.0)
MCV: 85.4 fL (ref 80.0–100.0)
Monocytes Absolute: 0.6 10*3/uL (ref 0.1–1.0)
Monocytes Relative: 7 %
Neutro Abs: 6.7 10*3/uL (ref 1.7–7.7)
Neutrophils Relative %: 78 %
Platelet Count: 225 10*3/uL (ref 150–400)
RBC: 4.05 MIL/uL — ABNORMAL LOW (ref 4.22–5.81)
RDW: 16.3 % — ABNORMAL HIGH (ref 11.5–15.5)
WBC Count: 8.7 10*3/uL (ref 4.0–10.5)
nRBC: 0 % (ref 0.0–0.2)

## 2020-04-08 LAB — CMP (CANCER CENTER ONLY)
ALT: <6 U/L (ref 0–44)
AST: 7 U/L — ABNORMAL LOW (ref 15–41)
Albumin: 2.3 g/dL — ABNORMAL LOW (ref 3.5–5.0)
Alkaline Phosphatase: 76 U/L (ref 38–126)
Anion gap: 14 (ref 5–15)
BUN: 7 mg/dL — ABNORMAL LOW (ref 8–23)
CO2: 24 mmol/L (ref 22–32)
Calcium: 8.6 mg/dL — ABNORMAL LOW (ref 8.9–10.3)
Chloride: 102 mmol/L (ref 98–111)
Creatinine: 0.75 mg/dL (ref 0.61–1.24)
GFR, Est AFR Am: >60 mL/min (ref >60)
GFR, Estimated: >60 mL/min (ref >60)
Glucose, Bld: 117 mg/dL — ABNORMAL HIGH (ref 70–99)
Potassium: 4.2 mmol/L (ref 3.5–5.1)
Sodium: 140 mmol/L (ref 135–145)
Total Bilirubin: 0.7 mg/dL (ref 0.3–1.2)
Total Protein: 7.6 g/dL (ref 6.5–8.1)

## 2020-04-08 MED ORDER — PROCHLORPERAZINE MALEATE 10 MG PO TABS: 10.0000 mg | ORAL_TABLET | Freq: Four times a day (QID) | ORAL | 2 refills | Status: AC | PRN

## 2020-04-08 NOTE — Patient Instructions (Signed)
-  There are two main categories of lung cancer, they are named based on the size of the cancer cell. One is called Non-Small cell lung cancer. The other type is Small Cell Lung Cancer -The sample (biopsy) that they took of your tumor was consistent with a subtype of Non-small cell lung cancer called Squamous Cell Carcinoma -We covered a lot of important information at your appointment today regarding what the treatment plan is moving forward. Here are the the main points that were discussed at your office visit with [place] today:  -The treatment that you will receive consists of two chemotherapy drugs called Carboplatin and Paclitaxel (also called Taxol) -We are planning on starting your treatment next week on 04/15/2020 but before your start your treatment, I would like you to attend a Chemotherapy Education Class. This involves having you sit down with one of our nurse educators. She will discuss with you one-on-one more details about your treatment as well as general information about resources here at the [place] treatment will be given every week for about 6 weeks or so (as long as you are also receiving radiation). We will check your labs (blood work) once a week . Dr. Julien Nordmann or I will see you every other treatment just to make sure that you are doing well and that everything is on track. -We will get a CT scan about 3 weeks after you complete your treatment  Medications:  -Compazine was sent to your pharmacy. This medication is for nausea. You may take this every 6 hours as needed if you feel nausous.   Referrals -I will have the dietician and social work reach out to you.   Follow Up:  -We will see you back for a follow up visit in 2 weeks

## 2020-04-08 NOTE — Progress Notes (Signed)
START ON PATHWAY REGIMEN - Non-Small Cell Lung     Administer weekly:     Paclitaxel      Carboplatin   **Always confirm dose/schedule in your pharmacy ordering system**  Patient Characteristics: Preoperative or Nonsurgical Candidate (Clinical Staging), Stage III - Nonsurgical Candidate (Nonsquamous and Squamous), PS = 0, 1 Therapeutic Status: Preoperative or Nonsurgical Candidate (Clinical Staging) AJCC T Category: cT4 AJCC N Category: cN3 AJCC M Category: cM0 AJCC 8 Stage Grouping: IIIC ECOG Performance Status: 1 Intent of Therapy: Curative Intent, Discussed with Patient

## 2020-04-09 ENCOUNTER — Encounter: Payer: Self-pay | Admitting: General Practice

## 2020-04-09 ENCOUNTER — Telehealth (HOSPITAL_COMMUNITY): Payer: Self-pay | Admitting: General Practice

## 2020-04-09 NOTE — Progress Notes (Signed)
[facility] Initial Psychosocial Assessment Clinical Social Work  Clinical Social Work contacted by phone to assess psychosocial, emotional, mental health, and spiritual needs of the patient. Spoke w patient and w niece, Forest Redwine.    Barriers to care/review of distress screen:  - Transportation:  Do you anticipate any problems getting to appointments?  Do you have someone who can help run errands for you if you need it?  Does not drive, nieces take to appointments.  Both have jobs.  Would like Estate agent but family prefers to take him to appointments due to his difficulty breathing.  Family uses transport to appointments as a way to check on his welfare also.   - Help at home:  What is your living situation (alone, family, other)?  If you are physically unable to care for yourself, who would you call on to help you?  Lives alone in own apartment on [address].  Nieces and brother check on him regularly - "about every day."  Per niece, he may benefit from in home care if [place] can provide that.   - Support system:  What does your support system look like?  Who would you call on if you needed some kind of practical help?  What if you needed someone to talk to for emotional support?  Has brothers, sisters, nieces who help him maintain his independence.   - Finances:  Are you concerned about finances.  Considering returning to work?  If not, applying for disability?  Retired, fixed income.  Used to be a Brewing technologist.  Food has been identified as a concern - "I eat, but it doesn't stick."  Likes to eat "most anything."  Nieces bring food "every once in a while."  Bring lunch or dinner.  Has never used Meals on Wheels or similar, would like to be on waiting list for meal delivery.    What is your understanding of where you are with your cancer? Its cause?  Your treatment plan and what happens next?  Newly diagnosed w lung cancer, Stage 3.  Has COPD and "could hardly breathe", went to ED at [facility].  [facility] - Dr Koren Bound "something like that."  PCP is aware that he is being seen at [facility], "they give me a check for travel."  Gets out of breath easily and so limits his activities.  Watches TV and sedentary activities at home.    What are your worries for the future as you begin treatment for cancer?  "I don't worry, I go with the flow."    What are your hopes and priorities during your treatment? What is important to you? What are your goals for your care?  He could not identify anything in specific.    CSW Summary:  Patient and family psychosocial functioning including strengths, limitations, and coping skills: Patient is 79 year old male, newly diagnosed w Stage 3 lung cancer.  Has good family support from nieces and nephew, brother.  Family is helping w providing food, cleaning up the apartment.  Per niece, he is able to shower, dress, toilet.  "He eats, we provide food for him including breakfast/lunch/dinner."  Family aware that he is underweight - noticed he was losing weight, tired, was not getting up and fixing food as he should.  Family does not watch him eat, but they do provide breakfast/lunch/dinner/snack.  They have also provided him w protein shakes and similar.  Family is very supportive and engaged - they understand that  he does not have wife/children, so they are all trying to check on him and provide for his needs.  CSW discussed option of [place] w niece - after discussion, family would like to continue to take him to appointments.  They find this to be a good way to check on his welfare as well as transport.  They are providing food regularly to him - they are aware that he is underweight, but also stress that he has food available to him on a regular basis.  Per niece, he is content w his increasingly sedentary lifestyle.  Thus far, he has been able to attend to his personal ADLs and family has taken over housekeeping and food provision - this system appears to be  working well for the family/patient.    Identifications of barriers to care:  Lives alone w good support  Availability of community resources:  Has [facility] PCP, will contact VA CSW to determine if they can provide additional resources for in home care.  Clinical Social Worker follow up needed: Yes.   will check in w him next week after radiation appointment.    Edwyna Shell, LCSW Clinical Social Worker Phone:  [phone] Cell:  [phone]

## 2020-04-09 NOTE — Telephone Encounter (Signed)
[place] CSW Progress Notes  Called [place], confirmed patient's [facility] PCP is Dr Koren Bound.  His assigned social worker is Jola Baptist - they have left a message on his chart for her to call me to discuss additional resources that may be available to him through the [place] system.  Edwyna Shell, LCSW Clinical Social Worker Phone:  [phone] Cell:  [phone]

## 2020-04-10 ENCOUNTER — Other Ambulatory Visit: Payer: Self-pay

## 2020-04-10 ENCOUNTER — Telehealth: Payer: Self-pay | Admitting: *Deleted

## 2020-04-10 ENCOUNTER — Ambulatory Visit
Admission: RE | Admit: 2020-04-10 | Discharge: 2020-04-10 | Disposition: A | Discharge status: Home / Self Care | Payer: No Typology Code available for payment source | Source: Ambulatory Visit | Attending: Radiation Oncology | Admitting: Radiation Oncology

## 2020-04-10 DIAGNOSIS — Z5111 Encounter for antineoplastic chemotherapy: Secondary | ICD-10-CM | POA: Insufficient documentation

## 2020-04-10 DIAGNOSIS — Z79899 Other long term (current) drug therapy: Secondary | ICD-10-CM | POA: Insufficient documentation

## 2020-04-10 DIAGNOSIS — C3411 Malignant neoplasm of upper lobe, right bronchus or lung: Secondary | ICD-10-CM | POA: Insufficient documentation

## 2020-04-10 NOTE — Telephone Encounter (Signed)
Faxed consult note to Dorathy Daft @ [facility] [phone] that was requested.

## 2020-04-11 ENCOUNTER — Encounter: Payer: Self-pay | Admitting: Radiation Oncology

## 2020-04-11 NOTE — Progress Notes (Signed)
  Radiation Oncology         [phone] ________________________________  Name: AMON COSTILLA MRN: [id number]  Date: 04/11/2020  DOB: 03/16/1941    CC: Vonna Drafts, FNP  No ref. 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  The patient's large right upper lung mass was noted however patient also appeared to have a left hilar mass and either left partial lung collapse or pneumonia.  In light of this finding I do not feel patient would be a candidate for radiation therapy at this time until clarification of this new finding in the left lung.  I spoke with the patient's niece this evening.  I instructed her to call  Mr. Comers primary care physician at the [facility] tomorrow morning to arrange evaluation with possible chest x-ray or CT scan and antibiotic therapy.  I also instructed niece to obtain a temperature.  She has noticed little more coughing.  Patient complains of feeling cold.  If the point to worsen significantly in the evening hours she should take him to the emergency room for evaluation.  Radiation therapy is on hold in light of this new finding.  Patient's radiosensitizing chemotherapy early next week will also need to be postponed.      CT imaging through the left femur did not show any suspicious findings to corroborate  metastatic disease.                        Plan: Patient is to be evaluated at the [facility] tomorrow for possible pneumonia and antibiotic therapy.  Additional imaging likely.  ____________________________________ Gery Pray, MD

## 2020-04-12 ENCOUNTER — Emergency Department (HOSPITAL_COMMUNITY): Payer: No Typology Code available for payment source

## 2020-04-12 ENCOUNTER — Telehealth: Payer: Self-pay | Admitting: General Practice

## 2020-04-12 ENCOUNTER — Other Ambulatory Visit: Payer: Self-pay

## 2020-04-12 ENCOUNTER — Encounter (HOSPITAL_COMMUNITY): Payer: Self-pay

## 2020-04-12 ENCOUNTER — Emergency Department (HOSPITAL_COMMUNITY)
Admission: EM | Admit: 2020-04-12 | Discharge: 2020-04-12 | Disposition: A | Discharge status: Home / Self Care | Payer: No Typology Code available for payment source | Attending: Emergency Medicine | Admitting: Emergency Medicine

## 2020-04-12 ENCOUNTER — Inpatient Hospital Stay: Payer: No Typology Code available for payment source

## 2020-04-12 DIAGNOSIS — R05 Cough: Secondary | ICD-10-CM | POA: Insufficient documentation

## 2020-04-12 DIAGNOSIS — C3490 Malignant neoplasm of unspecified part of unspecified bronchus or lung: Secondary | ICD-10-CM | POA: Insufficient documentation

## 2020-04-12 DIAGNOSIS — J449 Chronic obstructive pulmonary disease, unspecified: Secondary | ICD-10-CM | POA: Diagnosis not present

## 2020-04-12 DIAGNOSIS — Z87891 Personal history of nicotine dependence: Secondary | ICD-10-CM | POA: Insufficient documentation

## 2020-04-12 DIAGNOSIS — R059 Cough, unspecified: Secondary | ICD-10-CM

## 2020-04-12 DIAGNOSIS — R918 Other nonspecific abnormal finding of lung field: Secondary | ICD-10-CM

## 2020-04-12 DIAGNOSIS — I10 Essential (primary) hypertension: Secondary | ICD-10-CM | POA: Insufficient documentation

## 2020-04-12 LAB — CBC WITH DIFFERENTIAL/PLATELET
Abs Immature Granulocytes: 0.03 10*3/uL (ref 0.00–0.07)
Basophils Absolute: 0 10*3/uL (ref 0.0–0.1)
Basophils Relative: 0 %
Eosinophils Absolute: 0 10*3/uL (ref 0.0–0.5)
Eosinophils Relative: 0 %
HCT: 33 % — ABNORMAL LOW (ref 39.0–52.0)
Hemoglobin: 9.9 g/dL — ABNORMAL LOW (ref 13.0–17.0)
Immature Granulocytes: 0 %
Lymphocytes Relative: 12 %
Lymphs Abs: 1.2 10*3/uL (ref 0.7–4.0)
MCH: 26.1 pg (ref 26.0–34.0)
MCHC: 30 g/dL (ref 30.0–36.0)
MCV: 86.8 fL (ref 80.0–100.0)
Monocytes Absolute: 0.8 10*3/uL (ref 0.1–1.0)
Monocytes Relative: 8 %
Neutro Abs: 7.5 10*3/uL (ref 1.7–7.7)
Neutrophils Relative %: 80 %
Platelets: 308 10*3/uL (ref 150–400)
RBC: 3.8 MIL/uL — ABNORMAL LOW (ref 4.22–5.81)
RDW: 16.3 % — ABNORMAL HIGH (ref 11.5–15.5)
WBC: 9.6 10*3/uL (ref 4.0–10.5)
nRBC: 0 % (ref 0.0–0.2)

## 2020-04-12 LAB — BASIC METABOLIC PANEL
Anion gap: 9 (ref 5–15)
BUN: 9 mg/dL (ref 8–23)
CO2: 28 mmol/L (ref 22–32)
Calcium: 9.4 mg/dL (ref 8.9–10.3)
Chloride: 102 mmol/L (ref 98–111)
Creatinine, Ser: 0.65 mg/dL (ref 0.61–1.24)
GFR calc Af Amer: >60 mL/min (ref >60)
GFR calc non Af Amer: >60 mL/min (ref >60)
Glucose, Bld: 117 mg/dL — ABNORMAL HIGH (ref 70–99)
Potassium: 3.7 mmol/L (ref 3.5–5.1)
Sodium: 139 mmol/L (ref 135–145)

## 2020-04-12 MED ORDER — IOHEXOL 300 MG/ML  SOLN
75.0000 mL | Freq: Once | INTRAMUSCULAR | Status: AC | PRN
  Administered 2020-04-12: 75 mL via INTRAVENOUS

## 2020-04-12 MED ORDER — SODIUM CHLORIDE (PF) 0.9 % IJ SOLN
INTRAMUSCULAR | Status: AC
  Filled 2020-04-12: qty 50

## 2020-04-12 MED ORDER — LEVOFLOXACIN 500 MG PO TABS
500.0000 mg | ORAL_TABLET | Freq: Once | ORAL | Status: AC
  Administered 2020-04-12: 500 mg via ORAL
  Filled 2020-04-12: qty 1

## 2020-04-12 MED ORDER — LEVOFLOXACIN 500 MG PO TABS
500.0000 mg | ORAL_TABLET | Freq: Every day | ORAL | 0 refills | Status: DC
Start: 2020-04-12 — End: 2020-04-29

## 2020-04-12 NOTE — ED Triage Notes (Signed)
Patient states his PCP called last night and reported that he had pneumonia. Patient states a non productive cough for months.

## 2020-04-12 NOTE — Discharge Instructions (Addendum)
Despite the CT today, it is still not conclusive what the area in question is (pneumonia, cancer, atelectasis, etc). You are being placed on an antibiotic to cover for the possibility of infection. Follow back up with your oncologist.

## 2020-04-12 NOTE — Progress Notes (Signed)
Pharmacist Chemotherapy Monitoring - Initial Assessment    Anticipated start date: 04/15/20   Regimen:  . Are orders appropriate based on the patient's diagnosis, regimen, and cycle? Yes . Does the plan date match the patient's scheduled date? Yes . Is the sequencing of drugs appropriate? Yes . Are the premedications appropriate for the patient's regimen? Yes . Prior Authorization for treatment is: Approved o If applicable, is the correct biosimilar selected based on the patient's insurance? not applicable  Organ Function and Labs: Marland Kitchen Are dose adjustments needed based on the patient's renal function, hepatic function, or hematologic function? No . Are appropriate labs ordered prior to the start of patient's treatment? No . Other organ system assessment, if indicated: N/A . The following baseline labs, if indicated, have been ordered: N/A  Dose Assessment: . Are the drug doses appropriate? Yes . Are the following correct: o Drug concentrations Yes o IV fluid compatible with drug Yes o Administration routes Yes o Timing of therapy Yes . If applicable, does the patient have documented access for treatment and/or plans for port-a-cath placement? yes . If applicable, have lifetime cumulative doses been properly documented and assessed? yes Lifetime Dose Tracking  No doses have been documented on this patient for the following tracked chemicals: Doxorubicin, Epirubicin, Idarubicin, Daunorubicin, Mitoxantrone, Bleomycin, Oxaliplatin, Carboplatin, Liposomal Doxorubicin  o   Toxicity Monitoring/Prevention: . The patient has the following take home antiemetics prescribed: Prochlorperazine . The patient has the following take home medications prescribed: N/A . Medication allergies and previous infusion related reactions, if applicable, have been reviewed and addressed. Yes . The patient's current medication list has been assessed for drug-drug interactions with their chemotherapy regimen.  No .  significant drug-drug interactions were identified on review.  Order Review: . Are the treatment plan orders signed? No . Is the patient scheduled to see a provider prior to their treatment? No  I verify that I have reviewed each item in the above checklist and answered each question accordingly.  [facility] 04/12/2020 2:01 PM

## 2020-04-12 NOTE — ED Provider Notes (Signed)
[facility] DEPT Provider Note   CSN: [id number] Arrival date & time: 04/12/20  1402     History Chief Complaint  Patient presents with  . Pneumonia  . Cough    George [name] is a 79 y.o. male.  HPI  79 year old male presenting for imaging.  History of squamous cell carcinoma of the right upper lobe.  He recently had CT simulation in preparation for radiation.  In addition to the large right upper lung mass he also had a left hilar mass and pneumonia versus partial lung collapse.  They felt it was prudent to defer therapy until this was further elucidated.  Patient himself does endorse a cough.  Nonproductive.  Feels like it may be a little bit worse over the last several days.  No fevers.  Feels cold frequently but this is a chronic issue.  No new pain.   Past Medical History:  Diagnosis Date  . Cancer [facility]) Prostate  . Hypertension     Patient Active Problem List   Diagnosis Date Noted  . Goals of care, counseling/discussion 04/08/2020  . Encounter for antineoplastic chemotherapy 04/08/2020  . Primary cancer of right upper lobe of lung ([place]) 04/04/2020  . Chest tightness 08/02/2016  . COPD exacerbation ([place]) 08/02/2016  . Pulmonary nodule 08/02/2016  . Dehydration 08/02/2016  . Chest pain 08/02/2016  . Thrombocytopenia, acquired ([facility]) 04/17/2012  . Rib fractures, left, closed, initial encounter 04/17/2012  . Alcohol abuse 04/16/2012  . Alcohol intoxication ([place]) 04/16/2012  . Aspiration pneumonia ([place]) 04/16/2012  . Tobacco abuse 04/16/2012  . History of prostate cancer 04/16/2012  . Poor dentition 04/16/2012    History reviewed. No pertinent surgical history.     Family History  Problem Relation Age of Onset  . Breast cancer Sister   . Cancer Paternal Uncle     Social History   Tobacco Use  . Smoking status: Former Smoker    Packs/day: 2.00    Years: 60.00    Pack years: 120.00    Types: Cigarettes    Quit date:  03/21/2020    Years since quitting: 0.0  . Smokeless tobacco: Never Used  Substance Use Topics  . Alcohol use: Not Currently  . Drug use: No    Home Medications Prior to Admission medications   Medication Sig Start Date End Date Taking? Authorizing Provider  Iron Combinations (IRON COMPLEX PO) Take by mouth.    [provider]  POTASSIUM PO Take by mouth.    [provider]  prochlorperazine (COMPAZINE) 10 MG tablet Take 1 tablet (10 mg total) by mouth every 6 (six) hours as needed. 04/08/20   Heilingoetter, Cassandra L, PA-C    Allergies    Aspirin  Review of Systems   Review of Systems All systems reviewed and negative, other than as noted in HPI.  Physical Exam Updated Vital Signs BP 125/73 (BP Location: Left Arm)   Pulse 96   Temp 98.3 F (36.8 C) (Oral)   Resp 20   Ht 5' 6.5" (1.689 m)   Wt 42.2 kg   SpO2 96%   BMI 14.79 kg/m   Physical Exam Vitals and nursing note reviewed.  Constitutional:      General: He is not in acute distress.    Appearance: He is well-developed.     Comments: Sitting in bed.  Frail/cachectic but not distressed.  HENT:     Head: Normocephalic and atraumatic.  Eyes:     General:  Right eye: No discharge.        Left eye: No discharge.     Conjunctiva/sclera: Conjunctivae normal.  Cardiovascular:     Rate and Rhythm: Normal rate and regular rhythm.     Heart sounds: Normal heart sounds. No murmur. No friction rub. No gallop.   Pulmonary:     Effort: Pulmonary effort is normal. No respiratory distress.     Breath sounds: Normal breath sounds.  Abdominal:     General: There is no distension.     Palpations: Abdomen is soft.     Tenderness: There is no abdominal tenderness.  Musculoskeletal:        General: No tenderness.     Cervical back: Neck supple.     Right lower leg: No edema.     Left lower leg: No edema.  Skin:    General: Skin is warm and dry.  Neurological:     Mental Status: He is alert.   Psychiatric:        Behavior: Behavior normal.        Thought Content: Thought content normal.     ED Results / Procedures / Treatments   Labs (all labs ordered are listed, but only abnormal results are displayed) Labs Reviewed  CBC WITH DIFFERENTIAL/PLATELET - Abnormal; Notable for the following components:      Result Value   RBC 3.80 (*)    Hemoglobin 9.9 (*)    HCT 33.0 (*)    RDW 16.3 (*)    All other components within normal limits  BASIC METABOLIC PANEL - Abnormal; Notable for the following components:   Glucose, Bld 117 (*)    All other components within normal limits    EKG None  Radiology No results found.   CT Chest W Contrast  Result Date: 04/12/2020 CLINICAL DATA:  79 year old male with persistent cough. EXAM: CT CHEST WITH CONTRAST TECHNIQUE: Multidetector CT imaging of the chest was performed during intravenous contrast administration. CONTRAST:  15mL OMNIPAQUE IOHEXOL 300 MG/ML  SOLN COMPARISON:  Chest CT dated 02/16/2020. FINDINGS: Cardiovascular: There is no cardiomegaly or pericardial effusion. Coronary vascular calcifications noted. Moderate atherosclerotic calcification of the thoracic aorta. No aneurysmal dilatation or dissection. The origins of the great vessels of the aortic arch appear patent as visualized. Evaluation of the pulmonary arteries is limited due to suboptimal opacification and timing of the contrast. No CT evidence of pulmonary embolism. Mediastinum/Nodes: Right hilar mass/adenopathy measures 19 x 25 mm. Mildly enlarged lymph node anterior to the carina measures 14 mm in short axis. Several top-normal mildly rounded lymph nodes in the aortopulmonic window. The esophagus is grossly unremarkable. No mediastinal fluid collection. 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 There is a 6 x 7 cm (previously 5 x 7 cm) cavitary mass/consolidation in the right upper lobe anteriorly most concerning for malignancy. This abuts the right side of the  mediastinum and extends into the right hilum. There is a patchy triangular area of pleural base consolidation in the lingula, new since the prior CT of 02/16/2020 and may represent pneumonia or new malignancy/metastatic disease. A 15 x 20 mm nodular lesion in the right lung base associated with scarring is new since the prior CT. Small pleural based triangular nodular density at the left lung base (110/5) is new since the prior CT. There is a 5 mm subpleural nodular density at the right lung base (88/5), was present on the prior CT. There is no pleural effusion or pneumothorax. The central airways are  patent. Upper Abdomen: No acute abnormality. Musculoskeletal: Loss of subcutaneous fat and severe cachexia. Osteopenia. T10 compression fracture similar to prior CT. No acute osseous pathology. IMPRESSION: 1. No CT evidence of pulmonary embolism. 2. Interval increase in the size of the right upper lobe cavitary mass/consolidation most consistent with malignancy. Correlation with history recommended. 3. New consolidative changes in the lingula as well as focal nodular density at the right lung base, new since the prior CT. 4. Right hilar mass/adenopathy. 5. Aortic Atherosclerosis (ICD10-I70.0) and Emphysema (ICD10-J43.9). Electronically Signed   By: Anner Crete M.D.   On: 04/12/2020 20:51     Procedures Procedures (including critical care time)  Medications Ordered in ED Medications - No data to display  ED Course  I have reviewed the triage vital signs and the nursing notes.  Pertinent labs & imaging results that were available during my care of the patient were reviewed by me and considered in my medical decision making (see chart for details).    MDM Rules/Calculators/A&P                      79 year old male with lung cancer and recent abnormal imaging with left-sided atelectasis versus pneumonia.  Not clearly infectious based on the symptoms.  Will CT with contrast to try to further evaluate  this.  Imaging still equivocal. Will place on Abx for the time being.   Final Clinical Impression(s) / ED Diagnoses Final diagnoses:  Cough  Abnormal CT scan of lung    Rx / DC Orders ED Discharge Orders    None       Virgel Manifold, MD 04/15/20 1148

## 2020-04-12 NOTE — Telephone Encounter (Signed)
[facility] CSW Progress Notes  Call from [place], [place].  She works w patient's PCP - she will put in a referral for an assessment of need for home care services.  [facility] will contact niece Gabriel Cirri to schedule assessment and discuss needs.  Depending on assessment, patient may be able to receive in home caregiver services for a defined number of hours/week.    Edwyna Shell, LCSW Clinical Social Worker Phone:  [phone] Cell:  [phone]

## 2020-04-15 ENCOUNTER — Ambulatory Visit: Payer: No Typology Code available for payment source | Admitting: Radiation Oncology

## 2020-04-15 ENCOUNTER — Telehealth: Payer: Self-pay | Admitting: Medical Oncology

## 2020-04-15 ENCOUNTER — Inpatient Hospital Stay: Payer: No Typology Code available for payment source

## 2020-04-15 NOTE — Telephone Encounter (Signed)
LVM to return my call re  missed appts today .

## 2020-04-16 ENCOUNTER — Ambulatory Visit: Payer: No Typology Code available for payment source | Admitting: Radiation Oncology

## 2020-04-17 ENCOUNTER — Ambulatory Visit: Payer: No Typology Code available for payment source

## 2020-04-18 ENCOUNTER — Ambulatory Visit: Payer: No Typology Code available for payment source

## 2020-04-18 ENCOUNTER — Telehealth: Payer: Self-pay | Admitting: *Deleted

## 2020-04-18 ENCOUNTER — Encounter: Payer: Self-pay | Admitting: *Deleted

## 2020-04-18 NOTE — Telephone Encounter (Signed)
Received call from [place] @ [facility] [phone].  She needs office notes from when he saw Cassie and the referral from [place] to [place] to provide to their Medical Oncologist to approve him to get treatment here while he is getting daily Radiation.  States his visit of 04/08/2020 did not have [facility] approval.  Faxed notes to [phone] to expedite the aproval process.

## 2020-04-18 NOTE — Progress Notes (Signed)
I received notification that patient needs auth from [place] before he can see Dr. Sondra Come.  I faxed PA Cassie's last note to [phone].  I also called [place]by nurse navigator with the [facility] and updated and left vm message with my name and to call Rad Onc for any further needs.

## 2020-04-19 ENCOUNTER — Ambulatory Visit: Payer: No Typology Code available for payment source

## 2020-04-22 ENCOUNTER — Ambulatory Visit: Payer: No Typology Code available for payment source | Admitting: Radiation Oncology

## 2020-04-22 ENCOUNTER — Other Ambulatory Visit: Payer: Self-pay

## 2020-04-22 NOTE — Progress Notes (Signed)
[place] [id number]  DIAGNOSIS: stage IIIC non-small cell lung cancer, squamous cell carcinoma.  He presented with a right upper lobe mass and precardial lymphadenopathy and ipsilateral and contralateral hilar lymphadenopathy.  He was diagnosed in April 2021  PRIOR THERAPY: None  CURRENT THERAPY: Weekly concurrent chemoradiation with carboplatin for an AUC of 2 and paclitaxel 45 mg per metered squared.  First dose expected on 04/29/2020.  INTERVAL HISTORY: George [name] 79 y.o. male returns to the clinic today for a follow-up visit.  The patient was recently diagnosed with squamous cell carcinoma.  He was scheduled to start weekly concurrent chemoradiation; however, there was some delay due to authorization from the [place] before he can be seen by radiation oncology. He was recently seen in the emergency room on 04/12/2020 for cough and imaging revealed left-sided atelectasis versus pneumonia.  He was treated with antibiotics. He completed his course. He still has a dry cough that comes and goes which is what prompted his initial evaluation which led to his diagnosis back in March 2021.    Today, the patient is feeling fairly well without any concerning complaints except for continued dry cough.  He also notes baseline dyspnea on exertion. He notes some chest soreness from coughing. Denies hemoptysis. He does not use any OTC medications for his cough. He denies any chills, night sweats, sore throat, fever, rigors, nausea, or vomiting today. He has lost a significant amount of weight over the past year but gained 3lbs since his last appointment. He was scheduled to see a member of the nutritionist team while in infusion today. He denies any diarrhea or constipation.  He denies any headache or visual changes.  The patient is here today for follow-up visit before starting cycle #1 of his  treatment.  MEDICAL HISTORY: Past Medical History:  Diagnosis Date  . Cancer [facility]) Prostate  . Hypertension     ALLERGIES:  is allergic to aspirin.  MEDICATIONS:  Current Outpatient Medications  Medication Sig Dispense Refill  . Iron Combinations (IRON COMPLEX PO) Take by mouth.    . levofloxacin (LEVAQUIN) 500 MG tablet Take 1 tablet (500 mg total) by mouth daily. 5 tablet 0  . POTASSIUM PO Take by mouth.    . prochlorperazine (COMPAZINE) 10 MG tablet Take 1 tablet (10 mg total) by mouth every 6 (six) hours as needed. 30 tablet 2   No current facility-administered medications for this visit.    SURGICAL HISTORY: History reviewed. No pertinent surgical history.  REVIEW OF SYSTEMS:   Review of Systems  Constitutional: Positive for weight loss over past year. Negative for appetite change, chills, and fever HENT: Negative for mouth sores, nosebleeds, sore throat and trouble swallowing.   Eyes: Negative for eye problems and icterus.  Respiratory: Positive for persistent dry cough and baseline dyspnea on exertion. Negative for hemoptysis and wheezing.   Cardiovascular: Positive for chest soreness with coughing. Negative for leg swelling.  Gastrointestinal: Negative for abdominal pain, constipation, diarrhea, nausea and vomiting.  Genitourinary: Negative for bladder incontinence, difficulty urinating, dysuria, frequency and hematuria.   Musculoskeletal: Negative for back pain, gait problem, neck pain and neck stiffness.  Skin: Negative for itching and rash.  Neurological: Negative for dizziness, extremity weakness, gait problem, headaches, light-headedness and seizures.  Hematological: Negative for adenopathy. Does not bruise/bleed easily.  Psychiatric/Behavioral: Negative for confusion, depression and sleep disturbance. The patient is not nervous/anxious.  PHYSICAL EXAMINATION:  Blood pressure (!) 141/61, pulse 76, temperature 98.9 F (37.2 C), temperature source Temporal,  resp. rate 17, height 5' 6.5" (1.689 m), weight 96 lb 1.6 oz (43.6 kg), SpO2 100 %.  ECOG PERFORMANCE STATUS: 1 - Symptomatic but completely ambulatory  Physical Exam  Constitutional: Oriented to person, place, and time and cachectic appearing male and in no distress.  HENT:  Head: Normocephalic and atraumatic.  Mouth/Throat: Oropharynx is clear and moist. No oropharyngeal exudate.  Eyes: Conjunctivae are normal. Right eye exhibits no discharge. Left eye exhibits no discharge. No scleral icterus.  Neck: Normal range of motion. Neck supple.  Cardiovascular: Normal rate, regular rhythm, normal heart sounds and intact distal pulses.   Pulmonary/Chest: Effort normal.  Decreased breath sounds in all lung fields.  No respiratory distress. No wheezes. No rales.  Abdominal: Soft. Bowel sounds are normal. Exhibits no distension and no mass. There is no tenderness.  Musculoskeletal: Normal range of motion. Exhibits no edema.  Lymphadenopathy:    No cervical adenopathy.  Neurological: Alert and oriented to person, place, and time. Exhibits normal muscle tone. Gait normal. Coordination normal.  Skin: Skin is warm and dry. No rash noted. Not diaphoretic. No erythema. No pallor.  Psychiatric: Mood, memory and judgment normal.  Vitals reviewed.  LABORATORY DATA: Lab Results  Component Value Date   WBC 6.9 04/23/2020   HGB 9.2 (L) 04/23/2020   HCT 30.5 (L) 04/23/2020   MCV 84.3 04/23/2020   PLT 265 04/23/2020      Chemistry      Component Value Date/Time   NA 141 04/23/2020 1030   K 3.4 (L) 04/23/2020 1030   CL 103 04/23/2020 1030   CO2 28 04/23/2020 1030   BUN 6 (L) 04/23/2020 1030   CREATININE 0.68 04/23/2020 1030      Component Value Date/Time   CALCIUM 9.2 04/23/2020 1030   ALKPHOS 84 04/23/2020 1030   AST 7 (L) 04/23/2020 1030   ALT <6 04/23/2020 1030   BILITOT 0.3 04/23/2020 1030       RADIOGRAPHIC STUDIES:  CT Chest W Contrast  Result Date: 04/12/2020 CLINICAL DATA:   79 year old male with persistent cough. EXAM: CT CHEST WITH CONTRAST TECHNIQUE: Multidetector CT imaging of the chest was performed during intravenous contrast administration. CONTRAST:  63mL OMNIPAQUE IOHEXOL 300 MG/ML  SOLN COMPARISON:  Chest CT dated 02/16/2020. FINDINGS: Cardiovascular: There is no cardiomegaly or pericardial effusion. Coronary vascular calcifications noted. Moderate atherosclerotic calcification of the thoracic aorta. No aneurysmal dilatation or dissection. The origins of the great vessels of the aortic arch appear patent as visualized. Evaluation of the pulmonary arteries is limited due to suboptimal opacification and timing of the contrast. No CT evidence of pulmonary embolism. Mediastinum/Nodes: Right hilar mass/adenopathy measures 19 x 25 mm. Mildly enlarged lymph node anterior to the carina measures 14 mm in short axis. Several top-normal mildly rounded lymph nodes in the aortopulmonic window. The esophagus is grossly unremarkable. No mediastinal fluid collection. 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 There is a 6 x 7 cm (previously 5 x 7 cm) cavitary mass/consolidation in the right upper lobe anteriorly most concerning for malignancy. This abuts the right side of the mediastinum and extends into the right hilum. There is a patchy triangular area of pleural base consolidation in the lingula, new since the prior CT of 02/16/2020 and may represent pneumonia or new malignancy/metastatic disease. A 15 x 20 mm nodular lesion in the right lung base associated with scarring is new since  the prior CT. Small pleural based triangular nodular density at the left lung base (110/5) is new since the prior CT. There is a 5 mm subpleural nodular density at the right lung base (88/5), was present on the prior CT. There is no pleural effusion or pneumothorax. The central airways are patent. Upper Abdomen: No acute abnormality. Musculoskeletal: Loss of subcutaneous fat and severe cachexia.  Osteopenia. T10 compression fracture similar to prior CT. No acute osseous pathology. IMPRESSION: 1. No CT evidence of pulmonary embolism. 2. Interval increase in the size of the right upper lobe cavitary mass/consolidation most consistent with malignancy. Correlation with history recommended. 3. New consolidative changes in the lingula as well as focal nodular density at the right lung base, new since the prior CT. 4. Right hilar mass/adenopathy. 5. Aortic Atherosclerosis (ICD10-I70.0) and Emphysema (ICD10-J43.9). Electronically Signed   By: Anner Crete M.D.   On: 04/12/2020 20:51     ASSESSMENT/PLAN:   This is a very pleasant 79 year old African American male diagnosed with stage IIIC non-small cell lung cancer, squamous cell carcinoma.  He presented with a right upper lobe mass precardial lymphadenopathy and ipsilateral and contralateral hilar lymphadenopathy.  He also has a faint area of hypermetabolic activity left femoral shaft that Dr. Sondra Come did not feel was suspicious for metastasis.  He was diagnosed in April 2021.  The patient is currently undergoing concurrent chemoradiation with weekly carboplatin for an AUC of 2 and paclitaxel 45 mg/m. He was scheduled for his first treatment today. Due to his recent suspicious pneumonia, Dr. Sondra Come from radiation oncology is going to assess him in the scanner to see if improvement in the pneumonia vs possible atelectasis.   Therefore, will delay his treatment by one week until his condition can be further assessed.   The patient was given his chemo education class in the clinic today due to missing his scheduled appointment on 5/7 due to being in the emergency room.   I discussed the patient's appointments and plan over the phone with his niece and encouraged a family member to accompany him in the future.   We will see the patient back for follow-up visit in 1 weeks for evaluation before starting cycle #1.  Advised the patient to try some over  the counter cough medication for his cough.   The patient was advised to call immediately if he has any concerning symptoms in the interval. The patient voices understanding of current disease status and treatment options and is in agreement with the current care plan. All questions were answered. The patient knows to call the clinic with any problems, questions or concerns. We can certainly see the patient much sooner if necessary    No orders of the defined types were placed in this encounter.    Jasmon Graffam L Alyha Marines, PA-C 04/23/20

## 2020-04-23 ENCOUNTER — Ambulatory Visit: Payer: No Typology Code available for payment source

## 2020-04-23 ENCOUNTER — Ambulatory Visit
Admission: RE | Admit: 2020-04-23 | Discharge: 2020-04-23 | Disposition: A | Discharge status: Home / Self Care | Payer: No Typology Code available for payment source | Source: Ambulatory Visit | Attending: Radiation Oncology | Admitting: Radiation Oncology

## 2020-04-23 ENCOUNTER — Encounter: Payer: Self-pay | Admitting: Dietician

## 2020-04-23 ENCOUNTER — Encounter: Payer: Self-pay | Admitting: Physician Assistant

## 2020-04-23 ENCOUNTER — Inpatient Hospital Stay: Payer: No Typology Code available for payment source | Admitting: Nutrition

## 2020-04-23 ENCOUNTER — Inpatient Hospital Stay (HOSPITAL_BASED_OUTPATIENT_CLINIC_OR_DEPARTMENT_OTHER): Payer: No Typology Code available for payment source | Admitting: Physician Assistant

## 2020-04-23 ENCOUNTER — Inpatient Hospital Stay: Payer: No Typology Code available for payment source

## 2020-04-23 ENCOUNTER — Other Ambulatory Visit: Payer: Self-pay

## 2020-04-23 VITALS — BP 141/61 | HR 76 | Temp 98.9°F | Resp 17 | Ht 66.5 in | Wt 96.1 lb

## 2020-04-23 DIAGNOSIS — C3411 Malignant neoplasm of upper lobe, right bronchus or lung: Secondary | ICD-10-CM | POA: Diagnosis not present

## 2020-04-23 DIAGNOSIS — Z5111 Encounter for antineoplastic chemotherapy: Secondary | ICD-10-CM | POA: Diagnosis not present

## 2020-04-23 LAB — CBC WITH DIFFERENTIAL (CANCER CENTER ONLY)
Abs Immature Granulocytes: 0.02 10*3/uL (ref 0.00–0.07)
Basophils Absolute: 0 10*3/uL (ref 0.0–0.1)
Basophils Relative: 1 %
Eosinophils Absolute: 0 10*3/uL (ref 0.0–0.5)
Eosinophils Relative: 0 %
HCT: 30.5 % — ABNORMAL LOW (ref 39.0–52.0)
Hemoglobin: 9.2 g/dL — ABNORMAL LOW (ref 13.0–17.0)
Immature Granulocytes: 0 %
Lymphocytes Relative: 21 %
Lymphs Abs: 1.5 10*3/uL (ref 0.7–4.0)
MCH: 25.4 pg — ABNORMAL LOW (ref 26.0–34.0)
MCHC: 30.2 g/dL (ref 30.0–36.0)
MCV: 84.3 fL (ref 80.0–100.0)
Monocytes Absolute: 0.5 10*3/uL (ref 0.1–1.0)
Monocytes Relative: 8 %
Neutro Abs: 4.8 10*3/uL (ref 1.7–7.7)
Neutrophils Relative %: 70 %
Platelet Count: 265 10*3/uL (ref 150–400)
RBC: 3.62 MIL/uL — ABNORMAL LOW (ref 4.22–5.81)
RDW: 16.2 % — ABNORMAL HIGH (ref 11.5–15.5)
WBC Count: 6.9 10*3/uL (ref 4.0–10.5)
nRBC: 0 % (ref 0.0–0.2)

## 2020-04-23 LAB — CMP (CANCER CENTER ONLY)
ALT: <6 U/L (ref 0–44)
AST: 7 U/L — ABNORMAL LOW (ref 15–41)
Albumin: 2.4 g/dL — ABNORMAL LOW (ref 3.5–5.0)
Alkaline Phosphatase: 84 U/L (ref 38–126)
Anion gap: 10 (ref 5–15)
BUN: 6 mg/dL — ABNORMAL LOW (ref 8–23)
CO2: 28 mmol/L (ref 22–32)
Calcium: 9.2 mg/dL (ref 8.9–10.3)
Chloride: 103 mmol/L (ref 98–111)
Creatinine: 0.68 mg/dL (ref 0.61–1.24)
GFR, Est AFR Am: >60 mL/min (ref >60)
GFR, Estimated: >60 mL/min (ref >60)
Glucose, Bld: 84 mg/dL (ref 70–99)
Potassium: 3.4 mmol/L — ABNORMAL LOW (ref 3.5–5.1)
Sodium: 141 mmol/L (ref 135–145)
Total Bilirubin: 0.3 mg/dL (ref 0.3–1.2)
Total Protein: 7.6 g/dL (ref 6.5–8.1)

## 2020-04-23 NOTE — Progress Notes (Signed)
Met with patient at registration to introduce myself as Arboriculturist and to offer available resources.  Discussed one-time $1000 Radio broadcast assistant to assist with personal expenses while going through treatment.  Gave him my card if interested in applying and for any additional financial questions or concerns.

## 2020-04-23 NOTE — Patient Instructions (Signed)
-  There are two main categories of lung cancer, they are named based on the size of the cancer cell. One is called Non-Small cell lung cancer. The other type is Small Cell Lung Cancer -The sample (biopsy) that they took of your tumor was consistent with a subtype of Non-small cell lung cancer called Squamous Cell Carcinoma -We covered a lot of important information at your appointment today regarding what the treatment plan is moving forward. Here are the the main points that were discussed at your office visit with [place] today:  -The treatment that you will receive consists of two chemotherapy drugs called Carboplatin and Paclitaxel (also called Taxol) -We are planning on starting your treatment next week on 04/15/2020 but before your start your treatment, I would like you to attend a Chemotherapy Education Class. This involves having you sit down with one of our nurse educators. She will discuss with you one-on-one more details about your treatment as well as general information about resources here at the [facility] treatment will be given every week for about 6 weeks or so (as long as you are also receiving radiation). We will check your labs (blood work) once a week . Dr. Julien Nordmann or I will see you every other treatment just to make sure that you are doing well and that everything is on track. -We will get a CT scan about 3 weeks after you complete your treatment  Medications:  -Compazine was sent to your pharmacy. This medication is for nausea. You may take this every 6 hours as needed if you feel nausous.   Referrals -I will have the dietician and social work reach out to you.   Follow Up:  -We will see you back for a follow up visit in 2 weeks

## 2020-04-23 NOTE — Progress Notes (Signed)
Jesse Fall, RN at bedside providing chemotherapy education.   Per Cassie Heilingoetter, PA and MD [facility] chemo infusion tx deferred to next week with start of radiation

## 2020-04-23 NOTE — Progress Notes (Unsigned)
NUTRITION NOTE   Patient on RD schedule to be seen during infusion today. RD informed by RN in infusion that patient chemotherapy tx deferred until start of radiation treatments. Will follow-up when able.   Lajuan Lines, RD, LDN Clinical Nutrition After Hours/Weekend Pager # in [phone]

## 2020-04-23 NOTE — Progress Notes (Signed)
  Radiation Oncology         [phone]) [phone] ________________________________  Name: George [name] MRN: [id number]  Date: 04/23/2020  DOB: Aug 22, 1941  SIMULATION AND TREATMENT PLANNING NOTE    ICD-10-CM   1. Primary cancer of right upper lobe of lung (HCC)  C34.11     DIAGNOSIS: Stage IIIc (T3, N3, M0) vs Stage IVWell-differentiated squamous cell carcinoma presenting in the anterior right upper lobe  NARRATIVE:  The patient was brought to the [place].  Identity was confirmed.  All relevant records and images related to the planned course of therapy were reviewed.  The patient freely provided informed written consent to proceed with treatment after reviewing the details related to the planned course of therapy. The consent form was witnessed and verified by the simulation staff.  Then, the patient was set-up in a stable reproducible  supine position for radiation therapy.  CT images were obtained.  Surface markings were placed.  The CT images were loaded into the planning software.  Then the target and avoidance structures were contoured.  Treatment planning then occurred.  The radiation prescription was entered and confirmed.  Then, I designed and supervised the construction of a total of 4 medically necessary complex treatment devices.  I have requested : Intensity Modulated Radiotherapy (IMRT) is medically necessary for this case for the following reason:  Dose homogeneity and necessity of covering contralateral left hilar region..  I have ordered:dose calc.  PLAN:  The patient will receive 60 Gy in 30 fractions directed at the right upper lobe mass mediastinal area and contralateral hilar region.  Patient will also receive radiosensitizing chemotherapy during his chest radiation treatment.  Special Treatment Procedure Note: The patient will be receiving radiosensitizing chemotherapy. Given the potential of increased toxicities related to combined therapy and the necessity for  close monitoring of the patient and blood work, this constitutes a special treatment procedure.  Narrative: Patient initially underwent CT simulation on May 4.  At that time the patient was noted to have progressive disease in the left hilar region with resulting postobstructive pneumonia or atelectasis.  Patient was placed on antibiotics.  CT scan today shows no appreciable change after antibiotic therapy suggesting this is postobstructive atelectasis due to tumor..... CT scans through the left femur region did not show any appreciable lesion which was questionable on the patient's PET scan.  -----------------------------------  Blair Promise, PhD, MD  This document serves as a record of services personally performed by Gery Pray, MD. It was created on his behalf by Clerance Lav, a trained medical scribe. The creation of this record is based on the scribe's personal observations and the provider's statements to them. This document has been checked and approved by the attending provider.

## 2020-04-24 ENCOUNTER — Ambulatory Visit: Payer: No Typology Code available for payment source | Admitting: Radiation Oncology

## 2020-04-24 DIAGNOSIS — Z5111 Encounter for antineoplastic chemotherapy: Secondary | ICD-10-CM | POA: Diagnosis not present

## 2020-04-24 LAB — PSA, TOTAL AND FREE
PSA, Free Pct: UNDETERMINED %
PSA, Free: <0.02 ng/mL
Prostate Specific Ag, Serum: <0.1 ng/mL (ref 0.0–4.0)

## 2020-04-25 ENCOUNTER — Ambulatory Visit: Payer: No Typology Code available for payment source

## 2020-04-25 ENCOUNTER — Telehealth: Payer: Self-pay | Admitting: Internal Medicine

## 2020-04-25 ENCOUNTER — Other Ambulatory Visit: Payer: Self-pay | Admitting: Radiation Oncology

## 2020-04-25 ENCOUNTER — Encounter: Payer: Self-pay | Admitting: *Deleted

## 2020-04-25 ENCOUNTER — Ambulatory Visit
Admission: RE | Admit: 2020-04-25 | Discharge: 2020-04-25 | Disposition: A | Discharge status: Home / Self Care | Payer: Self-pay | Source: Ambulatory Visit | Attending: Radiation Oncology | Admitting: Radiation Oncology

## 2020-04-25 ENCOUNTER — Telehealth: Payer: Self-pay | Admitting: *Deleted

## 2020-04-25 DIAGNOSIS — C3490 Malignant neoplasm of unspecified part of unspecified bronchus or lung: Secondary | ICD-10-CM

## 2020-04-25 NOTE — Telephone Encounter (Signed)
Left voicemail to pr's niece sabrina regarding upcoming appts.

## 2020-04-25 NOTE — Progress Notes (Signed)
Faxed [facility] authorization to [facility] revenue cycle

## 2020-04-25 NOTE — Telephone Encounter (Signed)
Scheduled per 5/18 sch message. Unable to reach pt. Left voicemail. noted to give updated calendar for pt.

## 2020-04-25 NOTE — Progress Notes (Signed)
VA authorization taken to radiation oncology.  I will contact med onc auth with this information as well.

## 2020-04-25 NOTE — Telephone Encounter (Signed)
[place] Auth

## 2020-04-26 ENCOUNTER — Ambulatory Visit: Payer: No Typology Code available for payment source

## 2020-04-29 ENCOUNTER — Other Ambulatory Visit: Payer: No Typology Code available for payment source

## 2020-04-29 ENCOUNTER — Ambulatory Visit: Payer: No Typology Code available for payment source

## 2020-04-29 ENCOUNTER — Inpatient Hospital Stay: Payer: No Typology Code available for payment source

## 2020-04-29 ENCOUNTER — Other Ambulatory Visit: Payer: Self-pay

## 2020-04-29 ENCOUNTER — Ambulatory Visit: Payer: No Typology Code available for payment source | Admitting: Radiation Oncology

## 2020-04-29 ENCOUNTER — Encounter: Payer: Self-pay | Admitting: Internal Medicine

## 2020-04-29 ENCOUNTER — Inpatient Hospital Stay (HOSPITAL_BASED_OUTPATIENT_CLINIC_OR_DEPARTMENT_OTHER): Payer: No Typology Code available for payment source | Admitting: Internal Medicine

## 2020-04-29 VITALS — BP 142/69 | HR 97 | Temp 99.3°F | Resp 20 | Ht 66.5 in | Wt 92.6 lb

## 2020-04-29 VITALS — BP 149/83 | HR 86 | Temp 98.2°F | Resp 16

## 2020-04-29 DIAGNOSIS — C3411 Malignant neoplasm of upper lobe, right bronchus or lung: Secondary | ICD-10-CM | POA: Diagnosis not present

## 2020-04-29 DIAGNOSIS — Z5111 Encounter for antineoplastic chemotherapy: Secondary | ICD-10-CM | POA: Diagnosis not present

## 2020-04-29 DIAGNOSIS — Z7189 Other specified counseling: Secondary | ICD-10-CM | POA: Diagnosis not present

## 2020-04-29 LAB — CBC WITH DIFFERENTIAL (CANCER CENTER ONLY)
Abs Immature Granulocytes: 0.03 10*3/uL (ref 0.00–0.07)
Basophils Absolute: 0 10*3/uL (ref 0.0–0.1)
Basophils Relative: 0 %
Eosinophils Absolute: 0 10*3/uL (ref 0.0–0.5)
Eosinophils Relative: 0 %
HCT: 29.8 % — ABNORMAL LOW (ref 39.0–52.0)
Hemoglobin: 9.2 g/dL — ABNORMAL LOW (ref 13.0–17.0)
Immature Granulocytes: 0 %
Lymphocytes Relative: 23 %
Lymphs Abs: 1.8 10*3/uL (ref 0.7–4.0)
MCH: 25.6 pg — ABNORMAL LOW (ref 26.0–34.0)
MCHC: 30.9 g/dL (ref 30.0–36.0)
MCV: 83 fL (ref 80.0–100.0)
Monocytes Absolute: 0.8 10*3/uL (ref 0.1–1.0)
Monocytes Relative: 10 %
Neutro Abs: 5.4 10*3/uL (ref 1.7–7.7)
Neutrophils Relative %: 67 %
Platelet Count: 237 10*3/uL (ref 150–400)
RBC: 3.59 MIL/uL — ABNORMAL LOW (ref 4.22–5.81)
RDW: 16.2 % — ABNORMAL HIGH (ref 11.5–15.5)
WBC Count: 8.1 10*3/uL (ref 4.0–10.5)
nRBC: 0 % (ref 0.0–0.2)

## 2020-04-29 LAB — CMP (CANCER CENTER ONLY)
ALT: <6 U/L (ref 0–44)
AST: 7 U/L — ABNORMAL LOW (ref 15–41)
Albumin: 2.7 g/dL — ABNORMAL LOW (ref 3.5–5.0)
Alkaline Phosphatase: 78 U/L (ref 38–126)
Anion gap: 6 (ref 5–15)
BUN: 8 mg/dL (ref 8–23)
CO2: 29 mmol/L (ref 22–32)
Calcium: 10.5 mg/dL — ABNORMAL HIGH (ref 8.9–10.3)
Chloride: 104 mmol/L (ref 98–111)
Creatinine: 0.7 mg/dL (ref 0.61–1.24)
GFR, Est AFR Am: >60 mL/min (ref >60)
GFR, Estimated: >60 mL/min (ref >60)
Glucose, Bld: 91 mg/dL (ref 70–99)
Potassium: 3.8 mmol/L (ref 3.5–5.1)
Sodium: 139 mmol/L (ref 135–145)
Total Bilirubin: 0.8 mg/dL (ref 0.3–1.2)
Total Protein: 8 g/dL (ref 6.5–8.1)

## 2020-04-29 MED ORDER — DIPHENHYDRAMINE HCL 50 MG/ML IJ SOLN
INTRAMUSCULAR | Status: AC
  Filled 2020-04-29: qty 1

## 2020-04-29 MED ORDER — SODIUM CHLORIDE 0.9 % IV SOLN
123.8000 mg | Freq: Once | INTRAVENOUS | Status: AC
  Administered 2020-04-29: 120 mg via INTRAVENOUS
  Filled 2020-04-29: qty 12

## 2020-04-29 MED ORDER — FAMOTIDINE IN NACL 20-0.9 MG/50ML-% IV SOLN
INTRAVENOUS | Status: AC
  Filled 2020-04-29: qty 50

## 2020-04-29 MED ORDER — PALONOSETRON HCL INJECTION 0.25 MG/5ML
INTRAVENOUS | Status: AC
  Filled 2020-04-29: qty 5

## 2020-04-29 MED ORDER — DIPHENHYDRAMINE HCL 50 MG/ML IJ SOLN
50.0000 mg | Freq: Once | INTRAMUSCULAR | Status: AC
  Administered 2020-04-29: 50 mg via INTRAVENOUS

## 2020-04-29 MED ORDER — FAMOTIDINE IN NACL 20-0.9 MG/50ML-% IV SOLN
20.0000 mg | Freq: Once | INTRAVENOUS | Status: AC
  Administered 2020-04-29: 20 mg via INTRAVENOUS

## 2020-04-29 MED ORDER — SODIUM CHLORIDE 0.9 % IV SOLN
Freq: Once | INTRAVENOUS | Status: AC
  Filled 2020-04-29: qty 250

## 2020-04-29 MED ORDER — SODIUM CHLORIDE 0.9 % IV SOLN
20.0000 mg | Freq: Once | INTRAVENOUS | Status: AC
  Administered 2020-04-29: 20 mg via INTRAVENOUS
  Filled 2020-04-29: qty 20

## 2020-04-29 MED ORDER — SODIUM CHLORIDE 0.9 % IV SOLN
45.0000 mg/m2 | Freq: Once | INTRAVENOUS | Status: AC
  Administered 2020-04-29: 66 mg via INTRAVENOUS
  Filled 2020-04-29: qty 11

## 2020-04-29 MED ORDER — PALONOSETRON HCL INJECTION 0.25 MG/5ML
0.2500 mg | Freq: Once | INTRAVENOUS | Status: AC
  Administered 2020-04-29: 0.25 mg via INTRAVENOUS

## 2020-04-29 NOTE — Patient Instructions (Addendum)
[facility] Discharge Instructions for Patients Receiving Chemotherapy  Today you received the following chemotherapy agents taxol/carboplatin   To help prevent nausea and vomiting after your treatment, we encourage you to take your nausea medication as directed: TAKE PROCHLORPERAZINE EVERY 6 HRS AS NEEDED   If you develop nausea and vomiting that is not controlled by your nausea medication, call the clinic.   BELOW ARE SYMPTOMS THAT SHOULD BE REPORTED IMMEDIATELY:  *FEVER GREATER THAN 100.5 F  *CHILLS WITH OR WITHOUT FEVER  NAUSEA AND VOMITING THAT IS NOT CONTROLLED WITH YOUR NAUSEA MEDICATION  *UNUSUAL SHORTNESS OF BREATH  *UNUSUAL BRUISING OR BLEEDING  TENDERNESS IN MOUTH AND THROAT WITH OR WITHOUT PRESENCE OF ULCERS  *URINARY PROBLEMS  *BOWEL PROBLEMS  UNUSUAL RASH Items with * indicate a potential emergency and should be followed up as soon as possible.  Feel free to call the clinic you have any questions or concerns. The clinic phone number is [phone].

## 2020-04-29 NOTE — Progress Notes (Signed)
[place] [phone]   Fax:(336) [place] [address] [place] [address]  DIAGNOSIS: stage IIIC non-small cell lung cancer, squamous cell carcinoma.  He presented with a right upper lobe mass and precardial lymphadenopathy and ipsilateral and contralateral hilar lymphadenopathy.  He was diagnosed in April 2021  PRIOR THERAPY: None  CURRENT THERAPY: Weekly concurrent chemoradiation with carboplatin for an AUC of 2 and paclitaxel 45 mg/m2.  First dose expected on 04/29/2020.  INTERVAL HISTORY: George [name] 79 y.o. male returns to the clinic today for returns to the clinic today for follow-up visit.  The patient is feeling fine today with no concerning complaints except for aching pain in the arms bilaterally.  He takes Tylenol with improvement of his aching pain.  He denied having any current shortness of breath but has mild cough with no hemoptysis.  He denied having any fever or chills.  The patient denied having any nausea, vomiting, diarrhea or constipation.  He denied having any headache or visual changes.  Is here today for evaluation before starting the first cycle of his treatment with concurrent chemoradiation.  MEDICAL HISTORY: Past Medical History:  Diagnosis Date  . Cancer [facility] Prostate  . Hypertension     ALLERGIES:  is allergic to aspirin.  MEDICATIONS:  Current Outpatient Medications  Medication Sig Dispense Refill  . Iron Combinations (IRON COMPLEX PO) Take by mouth.    . levofloxacin (LEVAQUIN) 500 MG tablet Take 1 tablet (500 mg total) by mouth daily. 5 tablet 0  . POTASSIUM PO Take by mouth.    . prochlorperazine (COMPAZINE) 10 MG tablet Take 1 tablet (10 mg total) by mouth every 6 (six) hours as needed. 30 tablet 2   No current facility-administered medications for this visit.    SURGICAL HISTORY: No past surgical history on file.  REVIEW OF  SYSTEMS:  A comprehensive review of systems was negative except for: Constitutional: positive for fatigue Respiratory: positive for cough Musculoskeletal: positive for arthralgias   PHYSICAL EXAMINATION: General appearance: alert, cooperative, fatigued and no distress Head: Normocephalic, without obvious abnormality, atraumatic Neck: no adenopathy, no JVD, supple, symmetrical, trachea midline and thyroid not enlarged, symmetric, no tenderness/mass/nodules Lymph nodes: Cervical, supraclavicular, and axillary nodes normal. Resp: clear to auscultation bilaterally Back: symmetric, no curvature. ROM normal. No CVA tenderness. Cardio: regular rate and rhythm, S1, S2 normal, no murmur, click, rub or gallop GI: soft, non-tender; bowel sounds normal; no masses,  no organomegaly Extremities: extremities normal, atraumatic, no cyanosis or edema  ECOG PERFORMANCE STATUS: 1 - Symptomatic but completely ambulatory  Blood pressure (!) 142/69, pulse 97, temperature 99.3 F (37.4 C), temperature source Temporal, resp. rate 20, height 5' 6.5" (1.689 m), weight 92 lb 9.6 oz (42 kg), SpO2 94 %.  LABORATORY DATA: Lab Results  Component Value Date   WBC 6.9 04/23/2020   HGB 9.2 (L) 04/23/2020   HCT 30.5 (L) 04/23/2020   MCV 84.3 04/23/2020   PLT 265 04/23/2020      Chemistry      Component Value Date/Time   NA 141 04/23/2020 1030   K 3.4 (L) 04/23/2020 1030   CL 103 04/23/2020 1030   CO2 28 04/23/2020 1030   BUN 6 (L) 04/23/2020 1030   CREATININE 0.68 04/23/2020 1030      Component Value Date/Time   CALCIUM 9.2 04/23/2020 1030   ALKPHOS 84 04/23/2020 1030   AST 7 (L) 04/23/2020  1030   ALT <6 04/23/2020 1030   BILITOT 0.3 04/23/2020 1030       RADIOGRAPHIC STUDIES: CT Chest W Contrast  Result Date: 04/12/2020 CLINICAL DATA:  79 year old male with persistent cough. EXAM: CT CHEST WITH CONTRAST TECHNIQUE: Multidetector CT imaging of the chest was performed during intravenous contrast  administration. CONTRAST:  57mL OMNIPAQUE IOHEXOL 300 MG/ML  SOLN COMPARISON:  Chest CT dated 02/16/2020. FINDINGS: Cardiovascular: There is no cardiomegaly or pericardial effusion. Coronary vascular calcifications noted. Moderate atherosclerotic calcification of the thoracic aorta. No aneurysmal dilatation or dissection. The origins of the great vessels of the aortic arch appear patent as visualized. Evaluation of the pulmonary arteries is limited due to suboptimal opacification and timing of the contrast. No CT evidence of pulmonary embolism. Mediastinum/Nodes: Right hilar mass/adenopathy measures 19 x 25 mm. Mildly enlarged lymph node anterior to the carina measures 14 mm in short axis. Several top-normal mildly rounded lymph nodes in the aortopulmonic window. The esophagus is grossly unremarkable. No mediastinal fluid collection. 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 There is a 6 x 7 cm (previously 5 x 7 cm) cavitary mass/consolidation in the right upper lobe anteriorly most concerning for malignancy. This abuts the right side of the mediastinum and extends into the right hilum. There is a patchy triangular area of pleural base consolidation in the lingula, new since the prior CT of 02/16/2020 and may represent pneumonia or new malignancy/metastatic disease. A 15 x 20 mm nodular lesion in the right lung base associated with scarring is new since the prior CT. Small pleural based triangular nodular density at the left lung base (110/5) is new since the prior CT. There is a 5 mm subpleural nodular density at the right lung base (88/5), was present on the prior CT. There is no pleural effusion or pneumothorax. The central airways are patent. Upper Abdomen: No acute abnormality. Musculoskeletal: Loss of subcutaneous fat and severe cachexia. Osteopenia. T10 compression fracture similar to prior CT. No acute osseous pathology. IMPRESSION: 1. No CT evidence of pulmonary embolism. 2. Interval increase  in the size of the right upper lobe cavitary mass/consolidation most consistent with malignancy. Correlation with history recommended. 3. New consolidative changes in the lingula as well as focal nodular density at the right lung base, new since the prior CT. 4. Right hilar mass/adenopathy. 5. Aortic Atherosclerosis (ICD10-I70.0) and Emphysema (ICD10-J43.9). Electronically Signed   By: Anner Crete M.D.   On: 04/12/2020 20:51    ASSESSMENT AND PLAN: This is a very pleasant 79 years old African-American male recently diagnosed with stage IIIc non-small cell lung cancer, squamous cell carcinoma presented with right upper lobe lung mass in addition to ipsilateral and contralateral hilar lymphadenopathy diagnosed in April 2021. The patient is here today for evaluation before starting the first cycle of his concurrent chemoradiation with weekly carboplatin for AUC of 2 and paclitaxel 45 NG/M2.  The patient is feeling fine today with no concerning complaints. He has consented to the treatment which was also discussed with his daughter and they are in agreement with the treatment plan. I reminded him of the adverse effect of this treatment. The patient will come back for follow-up visit in 2 weeks for evaluation before starting cycle #3. He was advised to call immediately if he has any concerning symptoms in the interval. The patient voices understanding of current disease status and treatment options and is in agreement with the current care plan.  All questions were answered. The patient knows to call  the clinic with any problems, questions or concerns. We can certainly see the patient much sooner if necessary.  Disclaimer: This note was dictated with voice recognition software. Similar sounding words can inadvertently be transcribed and may not be corrected upon review.

## 2020-04-29 NOTE — Progress Notes (Signed)
  Radiation Oncology         [phone] ________________________________  Name: MCLEAN MOYA MRN: [id number]  Date: 04/30/2020  DOB: 1941-06-18  Simulation Verification Note    ICD-10-CM   1. Primary cancer of right upper lobe of lung ([place])  C34.11 Sonafine emulsion 1 application    Status: outpatient   NARRATIVE: The patient was brought to the treatment unit and placed in the planned treatment position. The clinical setup was verified. Then port films were obtained and uploaded to the radiation oncology medical record software. The treatment beams were carefully compared against the planned radiation fields. The position location and shape of the radiation fields was reviewed. They targeted volume of tissue appears to be appropriately covered by the radiation beams. Organs at risk appear to be excluded as planned.  Based on my personal review, I approved the simulation verification. The patient's treatment will proceed as planned.   -----------------------------------  Blair Promise, PhD, MD  This document serves as a record of services personally performed by Gery Pray, MD. It was created on his behalf by Clerance Lav, a trained medical scribe. The creation of this record is based on the scribe's personal observations and the provider's statements to them. This document has been checked and approved by the attending provider.

## 2020-04-30 ENCOUNTER — Telehealth: Payer: Self-pay | Admitting: Emergency Medicine

## 2020-04-30 ENCOUNTER — Other Ambulatory Visit: Payer: Self-pay

## 2020-04-30 ENCOUNTER — Ambulatory Visit
Admission: RE | Admit: 2020-04-30 | Discharge: 2020-04-30 | Disposition: A | Discharge status: Home / Self Care | Payer: No Typology Code available for payment source | Source: Ambulatory Visit | Attending: Radiation Oncology | Admitting: Radiation Oncology

## 2020-04-30 ENCOUNTER — Ambulatory Visit: Payer: No Typology Code available for payment source

## 2020-04-30 DIAGNOSIS — Z5111 Encounter for antineoplastic chemotherapy: Secondary | ICD-10-CM | POA: Diagnosis not present

## 2020-04-30 DIAGNOSIS — C3411 Malignant neoplasm of upper lobe, right bronchus or lung: Secondary | ICD-10-CM

## 2020-04-30 MED ORDER — SONAFINE EX EMUL
1.0000 "application " | Freq: Two times a day (BID) | CUTANEOUS | Status: DC
  Administered 2020-04-30: 1 via TOPICAL

## 2020-04-30 NOTE — Progress Notes (Signed)
Pharmacist Chemotherapy Monitoring - Follow Up Assessment    I verify that I have reviewed each item in the below checklist:  . Regimen for the patient is scheduled for the appropriate day and plan matches scheduled date. Marland Kitchen Appropriate non-routine labs are ordered dependent on drug ordered. . If applicable, additional medications reviewed and ordered per protocol based on lifetime cumulative doses and/or treatment regimen.   Plan for follow-up and/or issues identified: No . I-vent associated with next due treatment: No . MD and/or nursing notified: No  Fernanda Twaddell D 04/30/2020 9:28 AM

## 2020-04-30 NOTE — Telephone Encounter (Signed)
-----   Message from Arty Baumgartner, RN sent at 04/29/2020  3:40 PM EDT ----- Regarding: Julien Nordmann, first time First time taxol/carbo.  Tol[facility]. Dr. Julien Nordmann.

## 2020-04-30 NOTE — Telephone Encounter (Signed)
Chemo f/u call, spoke with Sabrina (niece/EC) who reports pt is only mildly fatigued but is eating/drinking normally and denies any N/V/D.  States her family is checking on him regularly and will f/u as needed with any questions/concerns.

## 2020-05-01 ENCOUNTER — Other Ambulatory Visit: Payer: Self-pay

## 2020-05-01 ENCOUNTER — Ambulatory Visit: Payer: No Typology Code available for payment source

## 2020-05-01 ENCOUNTER — Ambulatory Visit
Admission: RE | Admit: 2020-05-01 | Discharge: 2020-05-01 | Disposition: A | Discharge status: Home / Self Care | Payer: No Typology Code available for payment source | Source: Ambulatory Visit | Attending: Radiation Oncology | Admitting: Radiation Oncology

## 2020-05-01 DIAGNOSIS — Z5111 Encounter for antineoplastic chemotherapy: Secondary | ICD-10-CM | POA: Diagnosis not present

## 2020-05-02 ENCOUNTER — Ambulatory Visit: Payer: No Typology Code available for payment source

## 2020-05-02 ENCOUNTER — Ambulatory Visit
Admission: RE | Admit: 2020-05-02 | Discharge: 2020-05-02 | Disposition: A | Discharge status: Home / Self Care | Payer: No Typology Code available for payment source | Source: Ambulatory Visit | Attending: Radiation Oncology | Admitting: Radiation Oncology

## 2020-05-02 ENCOUNTER — Other Ambulatory Visit: Payer: Self-pay

## 2020-05-02 DIAGNOSIS — Z5111 Encounter for antineoplastic chemotherapy: Secondary | ICD-10-CM | POA: Diagnosis not present

## 2020-05-03 ENCOUNTER — Other Ambulatory Visit: Payer: Self-pay | Admitting: Medical Oncology

## 2020-05-03 ENCOUNTER — Ambulatory Visit: Payer: No Typology Code available for payment source

## 2020-05-03 ENCOUNTER — Ambulatory Visit
Admission: RE | Admit: 2020-05-03 | Discharge: 2020-05-03 | Disposition: A | Discharge status: Home / Self Care | Payer: No Typology Code available for payment source | Source: Ambulatory Visit | Attending: Radiation Oncology | Admitting: Radiation Oncology

## 2020-05-03 ENCOUNTER — Other Ambulatory Visit: Payer: Self-pay

## 2020-05-03 DIAGNOSIS — Z5111 Encounter for antineoplastic chemotherapy: Secondary | ICD-10-CM | POA: Diagnosis not present

## 2020-05-03 DIAGNOSIS — C3411 Malignant neoplasm of upper lobe, right bronchus or lung: Secondary | ICD-10-CM

## 2020-05-07 ENCOUNTER — Inpatient Hospital Stay: Payer: No Typology Code available for payment source

## 2020-05-07 ENCOUNTER — Other Ambulatory Visit: Payer: Self-pay

## 2020-05-07 ENCOUNTER — Encounter: Payer: No Typology Code available for payment source | Admitting: Nutrition

## 2020-05-07 ENCOUNTER — Telehealth: Payer: Self-pay | Admitting: Physician Assistant

## 2020-05-07 ENCOUNTER — Other Ambulatory Visit: Payer: Self-pay | Admitting: *Deleted

## 2020-05-07 ENCOUNTER — Ambulatory Visit
Admission: RE | Admit: 2020-05-07 | Discharge: 2020-05-07 | Disposition: A | Discharge status: Home / Self Care | Payer: No Typology Code available for payment source | Source: Ambulatory Visit | Attending: Radiation Oncology | Admitting: Radiation Oncology

## 2020-05-07 ENCOUNTER — Ambulatory Visit: Payer: No Typology Code available for payment source

## 2020-05-07 DIAGNOSIS — Z8546 Personal history of malignant neoplasm of prostate: Secondary | ICD-10-CM | POA: Insufficient documentation

## 2020-05-07 DIAGNOSIS — R0902 Hypoxemia: Secondary | ICD-10-CM | POA: Insufficient documentation

## 2020-05-07 DIAGNOSIS — C3411 Malignant neoplasm of upper lobe, right bronchus or lung: Secondary | ICD-10-CM | POA: Insufficient documentation

## 2020-05-07 DIAGNOSIS — Z5111 Encounter for antineoplastic chemotherapy: Secondary | ICD-10-CM | POA: Insufficient documentation

## 2020-05-07 DIAGNOSIS — R000 Tachycardia, unspecified: Secondary | ICD-10-CM | POA: Insufficient documentation

## 2020-05-07 DIAGNOSIS — Z79899 Other long term (current) drug therapy: Secondary | ICD-10-CM | POA: Insufficient documentation

## 2020-05-07 LAB — CBC WITH DIFFERENTIAL (CANCER CENTER ONLY)
Abs Immature Granulocytes: 0.07 10*3/uL (ref 0.00–0.07)
Basophils Absolute: 0 10*3/uL (ref 0.0–0.1)
Basophils Relative: 0 %
Eosinophils Absolute: 0 10*3/uL (ref 0.0–0.5)
Eosinophils Relative: 0 %
HCT: 31.6 % — ABNORMAL LOW (ref 39.0–52.0)
Hemoglobin: 9.6 g/dL — ABNORMAL LOW (ref 13.0–17.0)
Immature Granulocytes: 1 %
Lymphocytes Relative: 7 %
Lymphs Abs: 0.5 10*3/uL — ABNORMAL LOW (ref 0.7–4.0)
MCH: 26.2 pg (ref 26.0–34.0)
MCHC: 30.4 g/dL (ref 30.0–36.0)
MCV: 86.3 fL (ref 80.0–100.0)
Monocytes Absolute: 0.7 10*3/uL (ref 0.1–1.0)
Monocytes Relative: 9 %
Neutro Abs: 5.9 10*3/uL (ref 1.7–7.7)
Neutrophils Relative %: 83 %
Platelet Count: 248 10*3/uL (ref 150–400)
RBC: 3.66 MIL/uL — ABNORMAL LOW (ref 4.22–5.81)
RDW: 16 % — ABNORMAL HIGH (ref 11.5–15.5)
WBC Count: 7.2 10*3/uL (ref 4.0–10.5)
nRBC: 0 % (ref 0.0–0.2)

## 2020-05-07 LAB — CMP (CANCER CENTER ONLY)
ALT: <6 U/L (ref 0–44)
AST: 6 U/L — ABNORMAL LOW (ref 15–41)
Albumin: 2.7 g/dL — ABNORMAL LOW (ref 3.5–5.0)
Alkaline Phosphatase: 68 U/L (ref 38–126)
Anion gap: 15 (ref 5–15)
BUN: 10 mg/dL (ref 8–23)
CO2: 25 mmol/L (ref 22–32)
Calcium: 8.9 mg/dL (ref 8.9–10.3)
Chloride: 100 mmol/L (ref 98–111)
Creatinine: 0.69 mg/dL (ref 0.61–1.24)
GFR, Est AFR Am: >60 mL/min (ref >60)
GFR, Estimated: >60 mL/min (ref >60)
Glucose, Bld: 110 mg/dL — ABNORMAL HIGH (ref 70–99)
Potassium: 3.5 mmol/L (ref 3.5–5.1)
Sodium: 140 mmol/L (ref 135–145)
Total Bilirubin: 0.9 mg/dL (ref 0.3–1.2)
Total Protein: 8 g/dL (ref 6.5–8.1)

## 2020-05-07 LAB — SAMPLE TO BLOOD BANK

## 2020-05-07 NOTE — Telephone Encounter (Signed)
Called the patient's niece and let her know that she did not need to bring her uncle to his appointment which was rescheduled from today to 6/4. We will resume his treatment next week on 6/7. I cancelled his 6/4 appointments. She expressed understanding. Advised her that he still needs to go to his radiation appointments as planned.

## 2020-05-07 NOTE — Progress Notes (Signed)
George [name] [id number]  DIAGNOSIS: Stage IIIC non-small cell lung cancer, squamous cell carcinoma.  He presented with a right upper lobe mass and precardial lymphadenopathy and ipsilateral and contralateral hilar lymphadenopathy.  He was diagnosed in April 2021  PRIOR THERAPY: None  CURRENT THERAPY: Weekly concurrent chemoradiation with carboplatin for an AUC of 2 and paclitaxel 45 mg per metered squared.  First dose expected on 04/29/2020. Status post 1 cycle.   INTERVAL HISTORY: George [name] 79 y.o. male returns to the clinic for a follow up visit. The patient is feeling fine today without any concerning complaints.  The patient tolerated his first chemotherapy well without any adverse side effects. Denies any fever, chills, or night sweats. He continues to lose weight. He has an appointment to meet with the nutritionist while in the infusion room. He states he has a diminished appetite. Denies any hemoptysis. He occasionally gets a sharp pain in the anterior right rib cage but none at this time. He states this is not new and has been occurring for several months.  He continues to have a dry cough and dyspnea on exertion which is what prompted his evaluation to his recent diagnosis to begin with. He has not tried to take any OTC supplements for his cough. Denies any nausea, vomiting, diarrhea, or constipation. Denies any headache or visual changes. The patient is here today for evaluation prior to starting cycle # 2   MEDICAL HISTORY: Past Medical History:  Diagnosis Date  . Cancer [facility]) Prostate  . Hypertension     ALLERGIES:  is allergic to aspirin.  MEDICATIONS:  Current Outpatient Medications  Medication Sig Dispense Refill  . Iron Combinations (IRON COMPLEX PO) Take by mouth.    Marland Kitchen POTASSIUM PO Take by mouth.    . prochlorperazine (COMPAZINE) 10 MG tablet Take 1 tablet (10 mg  total) by mouth every 6 (six) hours as needed. (Patient not taking: Reported on 04/29/2020) 30 tablet 2   No current facility-administered medications for this visit.    SURGICAL HISTORY: No past surgical history on file.  REVIEW OF SYSTEMS:   Constitutional: Positive for weight loss over past year and decreased appetite. Negative for chills and fever HENT: Negative for mouth sores, nosebleeds, sore throat and trouble swallowing.   Eyes: Negative for eye problems and icterus.  Respiratory: Positive for persistent dry cough and baseline dyspnea on exertion. Negative for hemoptysis and wheezing.   Cardiovascular: Positive for chest soreness with coughing. Negative for leg swelling.  Gastrointestinal: Negative for abdominal pain, constipation, diarrhea, nausea and vomiting.  Genitourinary: Negative for bladder incontinence, difficulty urinating, dysuria, frequency and hematuria.   Musculoskeletal: Negative for back pain, gait problem, neck pain and neck stiffness.  Skin: Negative for itching and rash.  Neurological: Negative for dizziness, extremity weakness, gait problem, headaches, light-headedness and seizures.  Hematological: Negative for adenopathy. Does not bruise/bleed easily.  Psychiatric/Behavioral: Negative for confusion, depression and sleep disturbance. The patient is not nervous/anxious.       PHYSICAL EXAMINATION:  There were no vitals taken for this visit.  ECOG PERFORMANCE STATUS: 1 - Symptomatic but completely ambulatory  Physical Exam  Constitutional: Oriented to person, place, and time andcachectic appearing maleand in no distress.  HENT:  Head: Normocephalic and atraumatic.  Mouth/Throat: Oropharynx is clear and moist. No oropharyngeal exudate.  Eyes: Conjunctivae are normal. Right eye exhibits no discharge. Left eye exhibits no discharge. No scleral  icterus.  Neck: Normal range of motion. Neck supple.  Cardiovascular: Normal rate, regular rhythm, normal heart  sounds and intact distal pulses.  Pulmonary/Chest: Effort normal.Decreased breath sounds in all lung fields. No respiratory distress. No wheezes. No rales.  Abdominal: Soft. Bowel sounds are normal. Exhibits no distension and no mass. There is no tenderness.  Musculoskeletal: Normal range of motion. Exhibits no edema.  Lymphadenopathy:  No cervical adenopathy.  Neurological: Alert and oriented to person, place, and time. Exhibits normal muscle tone. Gait normal. Coordination normal.  Skin: Skin is warm and dry. No rash noted. Not diaphoretic. No erythema. No pallor.  Psychiatric: Mood, memory and judgment normal.  Vitals reviewed  LABORATORY DATA: Lab Results  Component Value Date   WBC 7.2 05/07/2020   HGB 9.6 (L) 05/07/2020   HCT 31.6 (L) 05/07/2020   MCV 86.3 05/07/2020   PLT 248 05/07/2020      Chemistry      Component Value Date/Time   NA 140 05/07/2020 1446   K 3.5 05/07/2020 1446   CL 100 05/07/2020 1446   CO2 25 05/07/2020 1446   BUN 10 05/07/2020 1446   CREATININE 0.69 05/07/2020 1446      Component Value Date/Time   CALCIUM 8.9 05/07/2020 1446   ALKPHOS 68 05/07/2020 1446   AST 6 (L) 05/07/2020 1446   ALT <6 05/07/2020 1446   BILITOT 0.9 05/07/2020 1446       RADIOGRAPHIC STUDIES:  CT Chest W Contrast  Result Date: 04/12/2020 CLINICAL DATA:  79 year old male with persistent cough. EXAM: CT CHEST WITH CONTRAST TECHNIQUE: Multidetector CT imaging of the chest was performed during intravenous contrast administration. CONTRAST:  32mL OMNIPAQUE IOHEXOL 300 MG/ML  SOLN COMPARISON:  Chest CT dated 02/16/2020. FINDINGS: Cardiovascular: There is no cardiomegaly or pericardial effusion. Coronary vascular calcifications noted. Moderate atherosclerotic calcification of the thoracic aorta. No aneurysmal dilatation or dissection. The origins of the great vessels of the aortic arch appear patent as visualized. Evaluation of the pulmonary arteries is limited due to  suboptimal opacification and timing of the contrast. No CT evidence of pulmonary embolism. Mediastinum/Nodes: Right hilar mass/adenopathy measures 19 x 25 mm. Mildly enlarged lymph node anterior to the carina measures 14 mm in short axis. Several top-normal mildly rounded lymph nodes in the aortopulmonic window. The esophagus is grossly unremarkable. No mediastinal fluid collection. 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 There is a 6 x 7 cm (previously 5 x 7 cm) cavitary mass/consolidation in the right upper lobe anteriorly most concerning for malignancy. This abuts the right side of the mediastinum and extends into the right hilum. There is a patchy triangular area of pleural base consolidation in the lingula, new since the prior CT of 02/16/2020 and may represent pneumonia or new malignancy/metastatic disease. A 15 x 20 mm nodular lesion in the right lung base associated with scarring is new since the prior CT. Small pleural based triangular nodular density at the left lung base (110/5) is new since the prior CT. There is a 5 mm subpleural nodular density at the right lung base (88/5), was present on the prior CT. There is no pleural effusion or pneumothorax. The central airways are patent. Upper Abdomen: No acute abnormality. Musculoskeletal: Loss of subcutaneous fat and severe cachexia. Osteopenia. T10 compression fracture similar to prior CT. No acute osseous pathology. IMPRESSION: 1. No CT evidence of pulmonary embolism. 2. Interval increase in the size of the right upper lobe cavitary mass/consolidation most consistent with malignancy. Correlation with history  recommended. 3. New consolidative changes in the lingula as well as focal nodular density at the right lung base, new since the prior CT. 4. Right hilar mass/adenopathy. 5. Aortic Atherosclerosis (ICD10-I70.0) and Emphysema (ICD10-J43.9). Electronically Signed   By: Anner Crete M.D.   On: 04/12/2020 20:51     ASSESSMENT/PLAN:   This is a very pleasant 79 year old African American male diagnosed with stage IIICnon-small cell lung cancer, squamous cell carcinoma. He presented with a right upper lobe mass precardial lymphadenopathy and ipsilateral and contralateral hilar lymphadenopathy. He also has a faint area of hypermetabolic activityleft femoral shaft that Dr. Sondra Come did not feel was suspicious for metastasis. He was diagnosed in April 2021.  The patient is currently undergoing concurrent chemoradiation with weekly carboplatin for an AUC of 2 and paclitaxel 45 mg/m. He is status post 1 cycle and tolerated it fairly well without any concerning adverse effects.   The last day of the patient's radiation is scheduled to be on 7/7  Labs were reviewed. Recommend that he proceed with cycle #2 today as scheduled.   We will see him back for a follow up visit in 2 weeks for evaluation before starting cycle #4.   He is scheduled to meet with a member of the nutritionist team while in the infusion room today regarding his weight loss.   Encouraged the patient to use OTC cough medication for his cough if needed.   The patient was advised to call immediately if he has any concerning symptoms in the interval. The patient voices understanding of current disease status and treatment options and is in agreement with the current care plan. All questions were answered. The patient knows to call the clinic with any problems, questions or concerns. We can certainly see the patient much sooner if necessary        No orders of the defined types were placed in this encounter.    Airlie Blumenberg L Ashlee Player, PA-C 05/07/20

## 2020-05-08 ENCOUNTER — Ambulatory Visit
Admission: RE | Admit: 2020-05-08 | Discharge: 2020-05-08 | Disposition: A | Discharge status: Home / Self Care | Payer: No Typology Code available for payment source | Source: Ambulatory Visit | Attending: Radiation Oncology | Admitting: Radiation Oncology

## 2020-05-08 ENCOUNTER — Ambulatory Visit: Payer: No Typology Code available for payment source

## 2020-05-08 ENCOUNTER — Other Ambulatory Visit: Payer: Self-pay

## 2020-05-08 DIAGNOSIS — Z5111 Encounter for antineoplastic chemotherapy: Secondary | ICD-10-CM | POA: Diagnosis not present

## 2020-05-09 ENCOUNTER — Ambulatory Visit
Admission: RE | Admit: 2020-05-09 | Discharge: 2020-05-09 | Disposition: A | Discharge status: Home / Self Care | Payer: No Typology Code available for payment source | Source: Ambulatory Visit | Attending: Radiation Oncology | Admitting: Radiation Oncology

## 2020-05-09 ENCOUNTER — Other Ambulatory Visit: Payer: Self-pay

## 2020-05-09 ENCOUNTER — Ambulatory Visit: Payer: No Typology Code available for payment source

## 2020-05-09 DIAGNOSIS — Z5111 Encounter for antineoplastic chemotherapy: Secondary | ICD-10-CM | POA: Diagnosis not present

## 2020-05-10 ENCOUNTER — Encounter: Payer: No Typology Code available for payment source | Admitting: Nutrition

## 2020-05-10 ENCOUNTER — Other Ambulatory Visit: Payer: Self-pay

## 2020-05-10 ENCOUNTER — Ambulatory Visit: Payer: No Typology Code available for payment source

## 2020-05-10 ENCOUNTER — Ambulatory Visit
Admission: RE | Admit: 2020-05-10 | Discharge: 2020-05-10 | Disposition: A | Discharge status: Home / Self Care | Payer: No Typology Code available for payment source | Source: Ambulatory Visit | Attending: Radiation Oncology | Admitting: Radiation Oncology

## 2020-05-10 DIAGNOSIS — Z5111 Encounter for antineoplastic chemotherapy: Secondary | ICD-10-CM | POA: Diagnosis not present

## 2020-05-13 ENCOUNTER — Emergency Department (HOSPITAL_COMMUNITY)
Admission: EM | Admit: 2020-05-13 | Discharge: 2020-05-13 | Disposition: A | Discharge status: Home / Self Care | Payer: No Typology Code available for payment source | Attending: Emergency Medicine | Admitting: Emergency Medicine

## 2020-05-13 ENCOUNTER — Inpatient Hospital Stay: Payer: No Typology Code available for payment source

## 2020-05-13 ENCOUNTER — Other Ambulatory Visit: Payer: Self-pay

## 2020-05-13 ENCOUNTER — Encounter (HOSPITAL_COMMUNITY): Payer: Self-pay | Admitting: Student

## 2020-05-13 ENCOUNTER — Inpatient Hospital Stay: Payer: No Typology Code available for payment source | Admitting: Nutrition

## 2020-05-13 ENCOUNTER — Emergency Department (HOSPITAL_COMMUNITY): Payer: No Typology Code available for payment source

## 2020-05-13 ENCOUNTER — Inpatient Hospital Stay (HOSPITAL_BASED_OUTPATIENT_CLINIC_OR_DEPARTMENT_OTHER): Payer: No Typology Code available for payment source | Admitting: Physician Assistant

## 2020-05-13 ENCOUNTER — Ambulatory Visit: Payer: No Typology Code available for payment source

## 2020-05-13 ENCOUNTER — Ambulatory Visit: Admission: RE | Admit: 2020-05-13 | Payer: No Typology Code available for payment source | Source: Ambulatory Visit

## 2020-05-13 ENCOUNTER — Inpatient Hospital Stay (HOSPITAL_BASED_OUTPATIENT_CLINIC_OR_DEPARTMENT_OTHER): Payer: No Typology Code available for payment source | Admitting: Medical

## 2020-05-13 VITALS — BP 103/62 | HR 100 | Temp 98.1°F | Resp 20 | Ht 66.5 in | Wt 88.3 lb

## 2020-05-13 VITALS — BP 146/98 | HR 144 | Resp 33

## 2020-05-13 DIAGNOSIS — Z8546 Personal history of malignant neoplasm of prostate: Secondary | ICD-10-CM | POA: Insufficient documentation

## 2020-05-13 DIAGNOSIS — C3411 Malignant neoplasm of upper lobe, right bronchus or lung: Secondary | ICD-10-CM

## 2020-05-13 DIAGNOSIS — C3490 Malignant neoplasm of unspecified part of unspecified bronchus or lung: Secondary | ICD-10-CM

## 2020-05-13 DIAGNOSIS — Z87891 Personal history of nicotine dependence: Secondary | ICD-10-CM | POA: Diagnosis not present

## 2020-05-13 DIAGNOSIS — J441 Chronic obstructive pulmonary disease with (acute) exacerbation: Secondary | ICD-10-CM | POA: Insufficient documentation

## 2020-05-13 DIAGNOSIS — I10 Essential (primary) hypertension: Secondary | ICD-10-CM | POA: Insufficient documentation

## 2020-05-13 DIAGNOSIS — R0902 Hypoxemia: Secondary | ICD-10-CM | POA: Diagnosis not present

## 2020-05-13 DIAGNOSIS — Z5111 Encounter for antineoplastic chemotherapy: Secondary | ICD-10-CM | POA: Diagnosis not present

## 2020-05-13 DIAGNOSIS — R0602 Shortness of breath: Secondary | ICD-10-CM | POA: Insufficient documentation

## 2020-05-13 DIAGNOSIS — R634 Abnormal weight loss: Secondary | ICD-10-CM

## 2020-05-13 DIAGNOSIS — R000 Tachycardia, unspecified: Secondary | ICD-10-CM | POA: Diagnosis not present

## 2020-05-13 LAB — CMP (CANCER CENTER ONLY)
ALT: 9 U/L (ref 0–44)
AST: 11 U/L — ABNORMAL LOW (ref 15–41)
Albumin: 2.4 g/dL — ABNORMAL LOW (ref 3.5–5.0)
Alkaline Phosphatase: 56 U/L (ref 38–126)
Anion gap: 14 (ref 5–15)
BUN: 10 mg/dL (ref 8–23)
CO2: 25 mmol/L (ref 22–32)
Calcium: 8.5 mg/dL — ABNORMAL LOW (ref 8.9–10.3)
Chloride: 100 mmol/L (ref 98–111)
Creatinine: 0.72 mg/dL (ref 0.61–1.24)
GFR, Est AFR Am: >60 mL/min (ref >60)
GFR, Estimated: >60 mL/min (ref >60)
Glucose, Bld: 110 mg/dL — ABNORMAL HIGH (ref 70–99)
Potassium: 3.4 mmol/L — ABNORMAL LOW (ref 3.5–5.1)
Sodium: 139 mmol/L (ref 135–145)
Total Bilirubin: 1.3 mg/dL — ABNORMAL HIGH (ref 0.3–1.2)
Total Protein: 7.5 g/dL (ref 6.5–8.1)

## 2020-05-13 LAB — CBC WITH DIFFERENTIAL (CANCER CENTER ONLY)
Abs Immature Granulocytes: 0.06 10*3/uL (ref 0.00–0.07)
Basophils Absolute: 0.1 10*3/uL (ref 0.0–0.1)
Basophils Relative: 1 %
Eosinophils Absolute: 0 10*3/uL (ref 0.0–0.5)
Eosinophils Relative: 0 %
HCT: 29.7 % — ABNORMAL LOW (ref 39.0–52.0)
Hemoglobin: 9.2 g/dL — ABNORMAL LOW (ref 13.0–17.0)
Immature Granulocytes: 1 %
Lymphocytes Relative: 7 %
Lymphs Abs: 0.5 10*3/uL — ABNORMAL LOW (ref 0.7–4.0)
MCH: 26.2 pg (ref 26.0–34.0)
MCHC: 31 g/dL (ref 30.0–36.0)
MCV: 84.6 fL (ref 80.0–100.0)
Monocytes Absolute: 1 10*3/uL (ref 0.1–1.0)
Monocytes Relative: 12 %
Neutro Abs: 6.1 10*3/uL (ref 1.7–7.7)
Neutrophils Relative %: 79 %
Platelet Count: 360 10*3/uL (ref 150–400)
RBC: 3.51 MIL/uL — ABNORMAL LOW (ref 4.22–5.81)
RDW: 15.6 % — ABNORMAL HIGH (ref 11.5–15.5)
WBC Count: 7.7 10*3/uL (ref 4.0–10.5)
nRBC: 0 % (ref 0.0–0.2)

## 2020-05-13 LAB — C DIFFICILE QUICK SCREEN W PCR REFLEX
C Diff antigen: NEGATIVE
C Diff interpretation: NOT DETECTED
C Diff toxin: NEGATIVE

## 2020-05-13 LAB — CBC
HCT: 30.5 % — ABNORMAL LOW (ref 39.0–52.0)
Hemoglobin: 9.1 g/dL — ABNORMAL LOW (ref 13.0–17.0)
MCH: 25.8 pg — ABNORMAL LOW (ref 26.0–34.0)
MCHC: 29.8 g/dL — ABNORMAL LOW (ref 30.0–36.0)
MCV: 86.4 fL (ref 80.0–100.0)
Platelets: 344 10*3/uL (ref 150–400)
RBC: 3.53 MIL/uL — ABNORMAL LOW (ref 4.22–5.81)
RDW: 15.7 % — ABNORMAL HIGH (ref 11.5–15.5)
WBC: 9.9 10*3/uL (ref 4.0–10.5)
nRBC: 0 % (ref 0.0–0.2)

## 2020-05-13 LAB — TROPONIN I (HIGH SENSITIVITY): Troponin I (High Sensitivity): 7 ng/L (ref <18)

## 2020-05-13 LAB — PROTIME-INR
INR: 1.3 — ABNORMAL HIGH (ref 0.8–1.2)
Prothrombin Time: 15.3 seconds — ABNORMAL HIGH (ref 11.4–15.2)

## 2020-05-13 LAB — BASIC METABOLIC PANEL
Anion gap: 11 (ref 5–15)
BUN: 15 mg/dL (ref 8–23)
CO2: 27 mmol/L (ref 22–32)
Calcium: 8.2 mg/dL — ABNORMAL LOW (ref 8.9–10.3)
Chloride: 100 mmol/L (ref 98–111)
Creatinine, Ser: 0.82 mg/dL (ref 0.61–1.24)
GFR calc Af Amer: >60 mL/min (ref >60)
GFR calc non Af Amer: >60 mL/min (ref >60)
Glucose, Bld: 181 mg/dL — ABNORMAL HIGH (ref 70–99)
Potassium: 4.4 mmol/L (ref 3.5–5.1)
Sodium: 138 mmol/L (ref 135–145)

## 2020-05-13 LAB — BRAIN NATRIURETIC PEPTIDE: B Natriuretic Peptide: 85.7 pg/mL (ref 0.0–100.0)

## 2020-05-13 MED ORDER — IOHEXOL 350 MG/ML SOLN
100.0000 mL | Freq: Once | INTRAVENOUS | Status: AC | PRN
  Administered 2020-05-13: 100 mL via INTRAVENOUS

## 2020-05-13 MED ORDER — SODIUM CHLORIDE 0.9 % IV SOLN
123.8000 mg | Freq: Once | INTRAVENOUS | Status: DC
  Filled 2020-05-13: qty 12

## 2020-05-13 MED ORDER — FAMOTIDINE IN NACL 20-0.9 MG/50ML-% IV SOLN
20.0000 mg | Freq: Once | INTRAVENOUS | Status: AC
  Administered 2020-05-13: 20 mg via INTRAVENOUS

## 2020-05-13 MED ORDER — PALONOSETRON HCL INJECTION 0.25 MG/5ML
INTRAVENOUS | Status: AC
  Filled 2020-05-13: qty 5

## 2020-05-13 MED ORDER — SODIUM CHLORIDE 0.9 % IV SOLN
45.0000 mg/m2 | Freq: Once | INTRAVENOUS | Status: AC
  Administered 2020-05-13: 66 mg via INTRAVENOUS
  Filled 2020-05-13: qty 11

## 2020-05-13 MED ORDER — FAMOTIDINE IN NACL 20-0.9 MG/50ML-% IV SOLN
INTRAVENOUS | Status: AC
  Filled 2020-05-13: qty 50

## 2020-05-13 MED ORDER — SODIUM CHLORIDE (PF) 0.9 % IJ SOLN
INTRAMUSCULAR | Status: AC
  Filled 2020-05-13: qty 50

## 2020-05-13 MED ORDER — DIPHENHYDRAMINE HCL 50 MG/ML IJ SOLN
INTRAMUSCULAR | Status: AC
  Filled 2020-05-13: qty 1

## 2020-05-13 MED ORDER — DIPHENHYDRAMINE HCL 50 MG/ML IJ SOLN
50.0000 mg | Freq: Once | INTRAMUSCULAR | Status: AC
  Administered 2020-05-13: 50 mg via INTRAVENOUS

## 2020-05-13 MED ORDER — SODIUM CHLORIDE 0.9 % IV SOLN
20.0000 mg | Freq: Once | INTRAVENOUS | Status: AC
  Administered 2020-05-13: 20 mg via INTRAVENOUS
  Filled 2020-05-13: qty 20

## 2020-05-13 MED ORDER — PALONOSETRON HCL INJECTION 0.25 MG/5ML
0.2500 mg | Freq: Once | INTRAVENOUS | Status: AC
  Administered 2020-05-13: 0.25 mg via INTRAVENOUS

## 2020-05-13 MED ORDER — SODIUM CHLORIDE 0.9 % IV SOLN
Freq: Once | INTRAVENOUS | Status: AC
  Filled 2020-05-13: qty 250

## 2020-05-13 NOTE — ED Notes (Addendum)
Patient 100% on 1L [place], weaned to room at now.  Patient remains 99% on room air. Dr. Melina Copa aware.

## 2020-05-13 NOTE — Patient Instructions (Signed)
[facility] Discharge Instructions for Patients Receiving Chemotherapy  Today you received the following chemotherapy agents Taxol; Carboplatin  To help prevent nausea and vomiting after your treatment, we encourage you to take your nausea medication as directed   If you develop nausea and vomiting that is not controlled by your nausea medication, call the clinic.   BELOW ARE SYMPTOMS THAT SHOULD BE REPORTED IMMEDIATELY:  *FEVER GREATER THAN 100.5 F  *CHILLS WITH OR WITHOUT FEVER  NAUSEA AND VOMITING THAT IS NOT CONTROLLED WITH YOUR NAUSEA MEDICATION  *UNUSUAL SHORTNESS OF BREATH  *UNUSUAL BRUISING OR BLEEDING  TENDERNESS IN MOUTH AND THROAT WITH OR WITHOUT PRESENCE OF ULCERS  *URINARY PROBLEMS  *BOWEL PROBLEMS  UNUSUAL RASH Items with * indicate a potential emergency and should be followed up as soon as possible.  Feel free to call the clinic should you have any questions or concerns. The clinic phone number is [phone].  Please show the [facility] at check-in to the Emergency Department and triage nurse.

## 2020-05-13 NOTE — ED Provider Notes (Signed)
[facility] DEPT Provider Note   CSN: [id number] Arrival date & time: 05/13/20  1400     History Chief Complaint  Patient presents with  . Shortness of Breath    George [name] is a 78 y.o. male.  HPI   Patient presented to the ED for evaluation of shortness of breath.  Patient has a history of lung cancer.  He was at the cancer center today receiving treatment when he suddenly started to feel very short of breath.  Patient was noted to be hypoxic and tachycardic.  They placed him on oxygen and his saturation returned to 100%.  Patient states he is breathing more easily now.  He denies any fevers or chills.  He is not having any chest pain.  Patient denies any prior history of PE.  No complaints of leg swelling.  Patient has had poor appetite started with his cancer and chemotherapy.  He also started having several loose stools today.  Past Medical History:  Diagnosis Date  . Cancer [facility]) Prostate  . Hypertension     Patient Active Problem List   Diagnosis Date Noted  . Weight loss 05/13/2020  . Goals of care, counseling/discussion 04/08/2020  . Encounter for antineoplastic chemotherapy 04/08/2020  . Primary cancer of right upper lobe of lung ([place]) 04/04/2020  . Chest tightness 08/02/2016  . COPD exacerbation ([place]) 08/02/2016  . Pulmonary nodule 08/02/2016  . Dehydration 08/02/2016  . Chest pain 08/02/2016  . Thrombocytopenia, acquired ([place]) 04/17/2012  . Rib fractures, left, closed, initial encounter 04/17/2012  . Alcohol abuse 04/16/2012  . Alcohol intoxication ([place]) 04/16/2012  . Aspiration pneumonia ([place]) 04/16/2012  . Tobacco abuse 04/16/2012  . History of prostate cancer 04/16/2012  . Poor dentition 04/16/2012    History reviewed. No pertinent surgical history.     Family History  Problem Relation Age of Onset  . Breast cancer Sister   . Cancer Paternal Uncle     Social History   Tobacco Use  . Smoking status: Former  Smoker    Packs/day: 2.00    Years: 60.00    Pack years: 120.00    Types: Cigarettes    Quit date: 03/21/2020    Years since quitting: 0.1  . Smokeless tobacco: Never Used  Substance Use Topics  . Alcohol use: Not Currently  . Drug use: No    Home Medications Prior to Admission medications   Medication Sig Start Date End Date Taking? Authorizing Provider  Iron Combinations (IRON COMPLEX PO) Take by mouth.    [provider]  POTASSIUM PO Take by mouth.    [provider]  prochlorperazine (COMPAZINE) 10 MG tablet Take 1 tablet (10 mg total) by mouth every 6 (six) hours as needed. Patient not taking: Reported on 04/29/2020 04/08/20   Heilingoetter, Cassandra L, PA-C    Allergies    Aspirin  Review of Systems   Review of Systems  All other systems reviewed and are negative.   Physical Exam Updated Vital Signs BP 104/63   Pulse (!) 115   Temp 98.7 F (37.1 C) (Oral)   Resp (!) 26   SpO2 99%   Physical Exam Vitals and nursing note reviewed.  Constitutional:      General: He is not in acute distress.    Appearance: He is ill-appearing.     Comments: Underweight  HENT:     Head: Normocephalic and atraumatic.     Right Ear: External ear normal.  Left Ear: External ear normal.  Eyes:     General: No scleral icterus.       Right eye: No discharge.        Left eye: No discharge.     Conjunctiva/sclera: Conjunctivae normal.  Neck:     Trachea: No tracheal deviation.  Cardiovascular:     Rate and Rhythm: Regular rhythm. Tachycardia present.  Pulmonary:     Effort: Pulmonary effort is normal. No respiratory distress.     Breath sounds: No stridor. Examination of the right-lower field reveals decreased breath sounds. Examination of the left-lower field reveals decreased breath sounds. Decreased breath sounds present. No wheezing or rales.  Abdominal:     General: Bowel sounds are normal. There is no distension.     Palpations: Abdomen is soft.      Tenderness: There is no abdominal tenderness. There is no guarding or rebound.  Musculoskeletal:        General: No tenderness.     Cervical back: Neck supple.     Right lower leg: No tenderness. No edema.     Left lower leg: No tenderness. No edema.  Skin:    General: Skin is warm and dry.     Findings: No rash.  Neurological:     Mental Status: He is alert.     Cranial Nerves: No cranial nerve deficit (no facial droop, extraocular movements intact, no slurred speech).     Sensory: No sensory deficit.     Motor: No abnormal muscle tone or seizure activity.     Coordination: Coordination normal.     ED Results / Procedures / Treatments   Labs (all labs ordered are listed, but only abnormal results are displayed) Labs Reviewed  C DIFFICILE QUICK SCREEN W PCR REFLEX  BASIC METABOLIC PANEL  CBC  BRAIN NATRIURETIC PEPTIDE  PROTIME-INR  TROPONIN I (HIGH SENSITIVITY)    Procedures .Critical Care Performed by: Dorie Rank, MD Authorized by: Dorie Rank, MD   Critical care provider statement:    Critical care time (minutes):  30   Critical care was time spent personally by me on the following activities:  Discussions with consultants, evaluation of patient's response to treatment, examination of patient, ordering and performing treatments and interventions, ordering and review of laboratory studies, ordering and review of radiographic studies, pulse oximetry, re-evaluation of patient's condition, obtaining history from patient or surrogate and review of old charts   (including critical care time)  Medications Ordered in ED Medications - No data to display  ED Course  I have reviewed the triage vital signs and the nursing notes.  Pertinent labs & imaging results that were available during my care of the patient were reviewed by me and considered in my medical decision making (see chart for details).    MDM Rules/Calculators/A&P                      Patient presented to the  ED for evaluation of shortness of breath.  Patient symptoms are concerning for the possibility of PE, PNA, COPD, pleural effusion.  Oxygenating normally on supplemental o2 at this point.   Will check labs, cxr.  Sh[place] Bridge will need a CT.  Care will be turned over to oncoming MD Final Clinical Impression(s) / ED Diagnoses pending   Dorie Rank, MD 05/13/20 1515

## 2020-05-13 NOTE — ED Notes (Signed)
Patient having multiple episodes of loose stools.  Patient has had 3 episodes since arriving.  Sample obtain if testing needed.

## 2020-05-13 NOTE — ED Notes (Signed)
Patient arrived from [facility] via [facility] with PA.  Patient was supposed to have a treatment there today but oxygen sats suddenly dropped into the 70's and HR 125-150.  Patient was placed on 2L [place] with improvement in O2 sats to 100%.  IV right arm placed by [place]. Patient hx of stage 3 non-small lung cancer, squamous cell carcinoma.

## 2020-05-13 NOTE — Discharge Instructions (Addendum)
You were seen for shortness of breath while you were at the cancer clinic today.  You had blood work and a CAT scan of your chest that did not show any obvious changes other than your known cancer.  Your oxygen numbers stabilized while you were here.  Please contact the cancer center for close follow-up.  Return to the emergency department for any worsening shortness of breath or other concerning symptoms

## 2020-05-13 NOTE — Progress Notes (Signed)
79 year old male diagnosed with non-small cell lung cancer in Apr 2021. He is a patient of Dr. Julien Nordmann and Dr. Sondra Come.  He is receiving concurrent chemoradiation therapy  Past medical history includes prostate cancer, hypertension and poor dentition.  Medications include iron complex, potassium, Levaquin, and Compazine.  Labs include potassium 3.4, glucose 110, and albumin 2.4 on Jun 7.  Height: 66.5 inches. Weight: 88.3 pounds Jun 7. Patient weighed 96.1 pounds on May 18. BMI: 14.04.  Estimated nutrition needs: 1200-1400 cal, 55-65 g protein, 1.4 L of fluid.  Patient is very sleepy after being given Benadryl. He reports he does have a decreased appetite. He denies problems chewing and swallowing. Reports he has lost taste for food. He denies nausea vomiting, constipation and diarrhea. He has lost 8% of his body weight over 3 weeks which is significant. Reports he likes oral nutrition supplements but declines to drink 1 today. He brought lunch with him today consisting of cheese crackers, banana, soda, and a cookie. Nutrition focused physical exam was deferred.  Nutrition diagnosis:  Unintended weight loss related to non-small cell lung cancer and associated treatments as evidenced by 8% weight loss over 3 weeks and BMI of 14.04.  Intervention: I educated patient on the importance of increasing calories and protein in small amounts throughout the day. Reviewed potential snacks and meals with patient. Recommended patient drink 2-3 bottles of oral nutrition supplements. Provided samples and coupons. Teach back method was used.  Monitoring, evaluation, goals: Patient will work to increase calories and protein to minimize further weight loss.  Next visit: Monday, Jun 14 during infusion.  **Disclaimer: This note was dictated with voice recognition software. Similar sounding words can inadvertently be transcribed and this note may contain transcription errors which may not have been  corrected upon publication of note.**

## 2020-05-13 NOTE — ED Notes (Signed)
Patient 100% on 2L [place], oxygen decreased to 1L [place].  Will continue to wean as tolerated.

## 2020-05-13 NOTE — ED Provider Notes (Signed)
Signout from Dr. Tomi Bamberger.  79 year old male with history of lung cancer at the cancer center today when he acutely had shortness of breath.  Now on oxygen with improvement in his symptoms.  He is pending lab work chest x-ray and will likely need a CT of his renal function can tolerate.  Anticipate will need to be admitted to the hospital. Physical Exam  BP 98/60   Pulse (!) 108   Temp 98.7 F (37.1 C) (Oral)   Resp (!) 24   SpO2 99%   Physical Exam  ED Course/Procedures     Procedures  MDM  Patient CT does not show any obvious PE.  He has been able to wean off of oxygen.  He would like to go home.  He ambulated in the department with shortness of breath but sats remained 93%.  Will discharge to home with close follow-up.  Daughter present and agrees with plan.       Hayden Rasmussen, MD 05/14/20 [phone]

## 2020-05-13 NOTE — Progress Notes (Unsigned)
Pt very SOB, gasping @ times, O2 sat in 70's, V. Tanner PA informed.  O2 started at 2L/min by , BP 146/98, HR 149, O2 sat 100% on O2.  EKG in process.

## 2020-05-13 NOTE — Progress Notes (Unsigned)
Per Sandi Mealy, PA. Ok to stop taxol infusion. Patient to be transported to ED to be further evaluated. Pharmacy notified.

## 2020-05-13 NOTE — ED Notes (Signed)
Patient remains at 93% on room air.  George [name], [place] aware.

## 2020-05-14 ENCOUNTER — Ambulatory Visit
Admission: RE | Admit: 2020-05-14 | Discharge: 2020-05-14 | Disposition: A | Discharge status: Home / Self Care | Payer: No Typology Code available for payment source | Source: Ambulatory Visit | Attending: Radiation Oncology | Admitting: Radiation Oncology

## 2020-05-14 ENCOUNTER — Ambulatory Visit: Payer: No Typology Code available for payment source

## 2020-05-14 ENCOUNTER — Other Ambulatory Visit: Payer: Self-pay

## 2020-05-14 DIAGNOSIS — Z5111 Encounter for antineoplastic chemotherapy: Secondary | ICD-10-CM | POA: Diagnosis not present

## 2020-05-14 NOTE — Progress Notes (Signed)
The patient was seen in the infusion room today when he became acutely hypoxic dropping his oxygen saturation to 78% on room air.  He was placed on 2 L of oxygen via nasal cannula initially his oxygen level increased to 82%.  He was eventually able to have his oxygen level recovered to 99%.  Additionally the patient's pulse rate increased to 151 bpm.  It appears that it is a sinus tachycardia with occasional PVCs.  Prior to this the patient had had an infiltration of [place] in his left anterior forearm.  He was transported to the emergency room for evaluation and management.  Sandi Mealy, MHS, PA-C Physician Assistant

## 2020-05-15 ENCOUNTER — Ambulatory Visit
Admission: RE | Admit: 2020-05-15 | Discharge: 2020-05-15 | Disposition: A | Discharge status: Home / Self Care | Payer: No Typology Code available for payment source | Source: Ambulatory Visit | Attending: Radiation Oncology | Admitting: Radiation Oncology

## 2020-05-15 ENCOUNTER — Other Ambulatory Visit: Payer: Self-pay

## 2020-05-15 ENCOUNTER — Ambulatory Visit: Payer: No Typology Code available for payment source

## 2020-05-15 DIAGNOSIS — Z5111 Encounter for antineoplastic chemotherapy: Secondary | ICD-10-CM | POA: Diagnosis not present

## 2020-05-16 ENCOUNTER — Ambulatory Visit
Admission: RE | Admit: 2020-05-16 | Discharge: 2020-05-16 | Disposition: A | Discharge status: Home / Self Care | Payer: No Typology Code available for payment source | Source: Ambulatory Visit | Attending: Radiation Oncology | Admitting: Radiation Oncology

## 2020-05-16 ENCOUNTER — Other Ambulatory Visit: Payer: Self-pay

## 2020-05-16 ENCOUNTER — Ambulatory Visit: Payer: No Typology Code available for payment source

## 2020-05-16 DIAGNOSIS — Z5111 Encounter for antineoplastic chemotherapy: Secondary | ICD-10-CM | POA: Diagnosis not present

## 2020-05-17 ENCOUNTER — Ambulatory Visit: Payer: No Typology Code available for payment source

## 2020-05-17 ENCOUNTER — Other Ambulatory Visit: Payer: Self-pay

## 2020-05-17 ENCOUNTER — Ambulatory Visit
Admission: RE | Admit: 2020-05-17 | Discharge: 2020-05-17 | Disposition: A | Discharge status: Home / Self Care | Payer: No Typology Code available for payment source | Source: Ambulatory Visit | Attending: Radiation Oncology | Admitting: Radiation Oncology

## 2020-05-17 DIAGNOSIS — Z5111 Encounter for antineoplastic chemotherapy: Secondary | ICD-10-CM | POA: Diagnosis not present

## 2020-05-20 ENCOUNTER — Ambulatory Visit: Payer: No Typology Code available for payment source

## 2020-05-20 ENCOUNTER — Inpatient Hospital Stay: Payer: No Typology Code available for payment source

## 2020-05-20 ENCOUNTER — Inpatient Hospital Stay: Payer: No Typology Code available for payment source | Admitting: Nutrition

## 2020-05-20 ENCOUNTER — Ambulatory Visit: Admission: RE | Admit: 2020-05-20 | Payer: No Typology Code available for payment source | Source: Ambulatory Visit

## 2020-05-20 ENCOUNTER — Other Ambulatory Visit: Payer: Self-pay

## 2020-05-20 VITALS — BP 100/60 | HR 82 | Temp 98.0°F | Resp 18

## 2020-05-20 DIAGNOSIS — C3411 Malignant neoplasm of upper lobe, right bronchus or lung: Secondary | ICD-10-CM

## 2020-05-20 DIAGNOSIS — Z5111 Encounter for antineoplastic chemotherapy: Secondary | ICD-10-CM | POA: Diagnosis not present

## 2020-05-20 LAB — CBC WITH DIFFERENTIAL (CANCER CENTER ONLY)
Abs Immature Granulocytes: 0.02 10*3/uL (ref 0.00–0.07)
Basophils Absolute: 0 10*3/uL (ref 0.0–0.1)
Basophils Relative: 1 %
Eosinophils Absolute: 0 10*3/uL (ref 0.0–0.5)
Eosinophils Relative: 0 %
HCT: 29.1 % — ABNORMAL LOW (ref 39.0–52.0)
Hemoglobin: 8.8 g/dL — ABNORMAL LOW (ref 13.0–17.0)
Immature Granulocytes: 1 %
Lymphocytes Relative: 13 %
Lymphs Abs: 0.5 10*3/uL — ABNORMAL LOW (ref 0.7–4.0)
MCH: 26.3 pg (ref 26.0–34.0)
MCHC: 30.2 g/dL (ref 30.0–36.0)
MCV: 87.1 fL (ref 80.0–100.0)
Monocytes Absolute: 0.4 10*3/uL (ref 0.1–1.0)
Monocytes Relative: 10 %
Neutro Abs: 3 10*3/uL (ref 1.7–7.7)
Neutrophils Relative %: 75 %
Platelet Count: 251 10*3/uL (ref 150–400)
RBC: 3.34 MIL/uL — ABNORMAL LOW (ref 4.22–5.81)
RDW: 16.4 % — ABNORMAL HIGH (ref 11.5–15.5)
WBC Count: 3.9 10*3/uL — ABNORMAL LOW (ref 4.0–10.5)
nRBC: 0 % (ref 0.0–0.2)

## 2020-05-20 LAB — CMP (CANCER CENTER ONLY)
ALT: 7 U/L (ref 0–44)
AST: 6 U/L — ABNORMAL LOW (ref 15–41)
Albumin: 2.4 g/dL — ABNORMAL LOW (ref 3.5–5.0)
Alkaline Phosphatase: 60 U/L (ref 38–126)
Anion gap: 9 (ref 5–15)
BUN: 11 mg/dL (ref 8–23)
CO2: 29 mmol/L (ref 22–32)
Calcium: 8.2 mg/dL — ABNORMAL LOW (ref 8.9–10.3)
Chloride: 103 mmol/L (ref 98–111)
Creatinine: 0.77 mg/dL (ref 0.61–1.24)
GFR, Est AFR Am: >60 mL/min (ref >60)
GFR, Estimated: >60 mL/min (ref >60)
Glucose, Bld: 90 mg/dL (ref 70–99)
Potassium: 3.7 mmol/L (ref 3.5–5.1)
Sodium: 141 mmol/L (ref 135–145)
Total Bilirubin: 0.3 mg/dL (ref 0.3–1.2)
Total Protein: 6.4 g/dL — ABNORMAL LOW (ref 6.5–8.1)

## 2020-05-20 MED ORDER — PALONOSETRON HCL INJECTION 0.25 MG/5ML
INTRAVENOUS | Status: AC
  Filled 2020-05-20: qty 5

## 2020-05-20 MED ORDER — SODIUM CHLORIDE 0.9 % IV SOLN
Freq: Once | INTRAVENOUS | Status: AC
  Filled 2020-05-20: qty 250

## 2020-05-20 MED ORDER — SODIUM CHLORIDE 0.9 % IV SOLN
123.8000 mg | Freq: Once | INTRAVENOUS | Status: AC
  Administered 2020-05-20: 120 mg via INTRAVENOUS
  Filled 2020-05-20: qty 12

## 2020-05-20 MED ORDER — SODIUM CHLORIDE 0.9 % IV SOLN
20.0000 mg | Freq: Once | INTRAVENOUS | Status: AC
  Administered 2020-05-20: 20 mg via INTRAVENOUS
  Filled 2020-05-20: qty 20

## 2020-05-20 MED ORDER — PALONOSETRON HCL INJECTION 0.25 MG/5ML
0.2500 mg | Freq: Once | INTRAVENOUS | Status: AC
  Administered 2020-05-20: 0.25 mg via INTRAVENOUS

## 2020-05-20 NOTE — Progress Notes (Signed)
Nutrition follow-up completed with patient during infusion for non-small cell lung cancer.  He is receiving concurrent chemo radiation therapy and is followed by Dr. Julien Nordmann and Dr. Sondra Come. Patient reports he was not weighed today. Labs were reviewed. He says he is eating better.  Reports drinking 1-2 bottles of Ensure daily. Patient denies other nutrition impact symptoms.  Estimated nutrition needs: 1200-1400 cal, 55-65 g protein, 1.4 L fluid.  Nutrition diagnosis: Unintended weight loss cannot be evaluated.  Intervention: Encourage patient to continue strategies for eating high-calorie, high-protein foods in small amounts throughout the day. Reviewed possible snacks. Recommended Ensure plus or Ensure Enlive twice daily between meals.  Monitoring, evaluation, goals: Patient will tolerate increased calories and protein to minimize weight loss.  Next visit: Monday, June 21 during infusion.  **Disclaimer: This note was dictated with voice recognition software. Similar sounding words can inadvertently be transcribed and this note may contain transcription errors which may not have been corrected upon publication of note.**

## 2020-05-20 NOTE — Progress Notes (Signed)
Taxol dropped from tx plan today due to response of tachycardia and oxygen desaturation during last taxol administration on 05/13/20, per Dr. Julien Nordmann.

## 2020-05-21 ENCOUNTER — Ambulatory Visit: Payer: No Typology Code available for payment source

## 2020-05-21 ENCOUNTER — Ambulatory Visit
Admission: RE | Admit: 2020-05-21 | Discharge: 2020-05-21 | Disposition: A | Discharge status: Home / Self Care | Payer: No Typology Code available for payment source | Source: Ambulatory Visit | Attending: Radiation Oncology | Admitting: Radiation Oncology

## 2020-05-21 DIAGNOSIS — Z5111 Encounter for antineoplastic chemotherapy: Secondary | ICD-10-CM | POA: Diagnosis not present

## 2020-05-22 ENCOUNTER — Ambulatory Visit
Admission: RE | Admit: 2020-05-22 | Discharge: 2020-05-22 | Disposition: A | Discharge status: Home / Self Care | Payer: No Typology Code available for payment source | Source: Ambulatory Visit | Attending: Radiation Oncology | Admitting: Radiation Oncology

## 2020-05-22 ENCOUNTER — Other Ambulatory Visit: Payer: Self-pay

## 2020-05-22 ENCOUNTER — Ambulatory Visit: Payer: No Typology Code available for payment source

## 2020-05-22 DIAGNOSIS — Z5111 Encounter for antineoplastic chemotherapy: Secondary | ICD-10-CM | POA: Diagnosis not present

## 2020-05-22 NOTE — Progress Notes (Signed)
Deleted Taxol, Benadryl and Pepcid per MD instructions due to reaction with cycle 2.  B. Corey Skains, PharmD, BCPS, BCOP

## 2020-05-23 ENCOUNTER — Ambulatory Visit
Admission: RE | Admit: 2020-05-23 | Discharge: 2020-05-23 | Disposition: A | Discharge status: Home / Self Care | Payer: No Typology Code available for payment source | Source: Ambulatory Visit | Attending: Radiation Oncology | Admitting: Radiation Oncology

## 2020-05-23 ENCOUNTER — Ambulatory Visit: Payer: No Typology Code available for payment source

## 2020-05-23 ENCOUNTER — Other Ambulatory Visit: Payer: Self-pay

## 2020-05-23 DIAGNOSIS — Z5111 Encounter for antineoplastic chemotherapy: Secondary | ICD-10-CM | POA: Diagnosis not present

## 2020-05-24 ENCOUNTER — Ambulatory Visit: Payer: No Typology Code available for payment source

## 2020-05-24 ENCOUNTER — Ambulatory Visit
Admission: RE | Admit: 2020-05-24 | Discharge: 2020-05-24 | Disposition: A | Discharge status: Home / Self Care | Payer: No Typology Code available for payment source | Source: Ambulatory Visit | Attending: Radiation Oncology | Admitting: Radiation Oncology

## 2020-05-24 ENCOUNTER — Other Ambulatory Visit: Payer: Self-pay

## 2020-05-24 DIAGNOSIS — Z5111 Encounter for antineoplastic chemotherapy: Secondary | ICD-10-CM | POA: Diagnosis not present

## 2020-05-27 ENCOUNTER — Encounter: Payer: Self-pay | Admitting: Internal Medicine

## 2020-05-27 ENCOUNTER — Ambulatory Visit: Payer: No Typology Code available for payment source

## 2020-05-27 ENCOUNTER — Inpatient Hospital Stay: Payer: No Typology Code available for payment source | Admitting: Nutrition

## 2020-05-27 ENCOUNTER — Inpatient Hospital Stay (HOSPITAL_BASED_OUTPATIENT_CLINIC_OR_DEPARTMENT_OTHER): Payer: No Typology Code available for payment source | Admitting: Internal Medicine

## 2020-05-27 ENCOUNTER — Ambulatory Visit
Admission: RE | Admit: 2020-05-27 | Discharge: 2020-05-27 | Disposition: A | Discharge status: Home / Self Care | Payer: No Typology Code available for payment source | Source: Ambulatory Visit | Attending: Radiation Oncology | Admitting: Radiation Oncology

## 2020-05-27 ENCOUNTER — Other Ambulatory Visit: Payer: Self-pay

## 2020-05-27 ENCOUNTER — Inpatient Hospital Stay: Payer: No Typology Code available for payment source

## 2020-05-27 VITALS — BP 124/64 | HR 96 | Temp 98.1°F | Resp 18 | Ht 66.5 in | Wt 94.7 lb

## 2020-05-27 DIAGNOSIS — C3411 Malignant neoplasm of upper lobe, right bronchus or lung: Secondary | ICD-10-CM

## 2020-05-27 DIAGNOSIS — Z5111 Encounter for antineoplastic chemotherapy: Secondary | ICD-10-CM

## 2020-05-27 LAB — CMP (CANCER CENTER ONLY)
ALT: 7 U/L (ref 0–44)
AST: 8 U/L — ABNORMAL LOW (ref 15–41)
Albumin: 2.6 g/dL — ABNORMAL LOW (ref 3.5–5.0)
Alkaline Phosphatase: 78 U/L (ref 38–126)
Anion gap: 8 (ref 5–15)
BUN: 10 mg/dL (ref 8–23)
CO2: 31 mmol/L (ref 22–32)
Calcium: 8.2 mg/dL — ABNORMAL LOW (ref 8.9–10.3)
Chloride: 103 mmol/L (ref 98–111)
Creatinine: 0.78 mg/dL (ref 0.61–1.24)
GFR, Est AFR Am: >60 mL/min (ref >60)
GFR, Estimated: >60 mL/min (ref >60)
Glucose, Bld: 121 mg/dL — ABNORMAL HIGH (ref 70–99)
Potassium: 3.9 mmol/L (ref 3.5–5.1)
Sodium: 142 mmol/L (ref 135–145)
Total Bilirubin: 0.4 mg/dL (ref 0.3–1.2)
Total Protein: 6.8 g/dL (ref 6.5–8.1)

## 2020-05-27 LAB — CBC WITH DIFFERENTIAL (CANCER CENTER ONLY)
Abs Immature Granulocytes: 0.02 10*3/uL (ref 0.00–0.07)
Basophils Absolute: 0 10*3/uL (ref 0.0–0.1)
Basophils Relative: 1 %
Eosinophils Absolute: 0 10*3/uL (ref 0.0–0.5)
Eosinophils Relative: 0 %
HCT: 30.4 % — ABNORMAL LOW (ref 39.0–52.0)
Hemoglobin: 9.1 g/dL — ABNORMAL LOW (ref 13.0–17.0)
Immature Granulocytes: 0 %
Lymphocytes Relative: 9 %
Lymphs Abs: 0.5 10*3/uL — ABNORMAL LOW (ref 0.7–4.0)
MCH: 26.5 pg (ref 26.0–34.0)
MCHC: 29.9 g/dL — ABNORMAL LOW (ref 30.0–36.0)
MCV: 88.4 fL (ref 80.0–100.0)
Monocytes Absolute: 0.5 10*3/uL (ref 0.1–1.0)
Monocytes Relative: 10 %
Neutro Abs: 4 10*3/uL (ref 1.7–7.7)
Neutrophils Relative %: 80 %
Platelet Count: 148 10*3/uL — ABNORMAL LOW (ref 150–400)
RBC: 3.44 MIL/uL — ABNORMAL LOW (ref 4.22–5.81)
RDW: 17.9 % — ABNORMAL HIGH (ref 11.5–15.5)
WBC Count: 5 10*3/uL (ref 4.0–10.5)
nRBC: 0 % (ref 0.0–0.2)

## 2020-05-27 MED ORDER — SODIUM CHLORIDE 0.9 % IV SOLN
123.8000 mg | Freq: Once | INTRAVENOUS | Status: AC
  Administered 2020-05-27: 120 mg via INTRAVENOUS
  Filled 2020-05-27: qty 12

## 2020-05-27 MED ORDER — PALONOSETRON HCL INJECTION 0.25 MG/5ML
0.2500 mg | Freq: Once | INTRAVENOUS | Status: AC
  Administered 2020-05-27: 0.25 mg via INTRAVENOUS

## 2020-05-27 MED ORDER — SODIUM CHLORIDE 0.9 % IV SOLN
20.0000 mg | Freq: Once | INTRAVENOUS | Status: AC
  Administered 2020-05-27: 20 mg via INTRAVENOUS
  Filled 2020-05-27: qty 20

## 2020-05-27 MED ORDER — SODIUM CHLORIDE 0.9 % IV SOLN
Freq: Once | INTRAVENOUS | Status: AC
  Filled 2020-05-27: qty 250

## 2020-05-27 MED ORDER — PALONOSETRON HCL INJECTION 0.25 MG/5ML
INTRAVENOUS | Status: AC
  Filled 2020-05-27: qty 5

## 2020-05-27 NOTE — Progress Notes (Signed)
[place] [phone]   Fax:([phone]) [place] [address] [place] [address]  DIAGNOSIS: stage IIIC non-small cell lung cancer, squamous cell carcinoma.  He presented with a right upper lobe mass and precardial lymphadenopathy and ipsilateral and contralateral hilar lymphadenopathy.  He was diagnosed in April 2021  PRIOR THERAPY: None  CURRENT THERAPY: Weekly concurrent chemoradiation with carboplatin for an AUC of 2 and paclitaxel 45 mg/m2.  First dose expected on 04/29/2020.  Status post 3 cycles of treatment.  INTERVAL HISTORY: George [name] 79 y.o. male returns to the clinic today for follow-up visit.  The patient is feeling fine today with no concerning complaints except for mild fatigue.  He denied having any current chest pain, shortness of breath, cough or hemoptysis.  He denied having any fever or chills.  He has no nausea, vomiting, diarrhea or constipation.  He denied having any headache or visual changes.  The patient continues to tolerate his concurrent chemoradiation fairly well.  Is here today for evaluation before starting cycle #4 of his treatment.  MEDICAL HISTORY: Past Medical History:  Diagnosis Date  . Cancer [facility]) Prostate  . Hypertension     ALLERGIES:  is allergic to aspirin.  MEDICATIONS:  Current Outpatient Medications  Medication Sig Dispense Refill  . acetaminophen (TYLENOL) 500 MG tablet Take 500 mg by mouth every 6 (six) hours as needed for moderate pain.    . Iron Combinations (IRON COMPLEX PO) Take 1 tablet by mouth daily.     Marland Kitchen POTASSIUM PO Take 1 tablet by mouth daily.     . prochlorperazine (COMPAZINE) 10 MG tablet Take 1 tablet (10 mg total) by mouth every 6 (six) hours as needed. 30 tablet 2   No current facility-administered medications for this visit.   Facility-Administered Medications Ordered in Other Visits  Medication Dose Route  Frequency Provider Last Rate Last Admin  . CARBOplatin (PARAPLATIN) 120 mg in sodium chloride 0.9 % 100 mL chemo infusion  120 mg Intravenous Once Curt Bears, MD        SURGICAL HISTORY: No past surgical history on file.  REVIEW OF SYSTEMS:  A comprehensive review of systems was negative except for: Constitutional: positive for fatigue   PHYSICAL EXAMINATION: General appearance: alert, cooperative, fatigued and no distress Head: Normocephalic, without obvious abnormality, atraumatic Neck: no adenopathy, no JVD, supple, symmetrical, trachea midline and thyroid not enlarged, symmetric, no tenderness/mass/nodules Lymph nodes: Cervical, supraclavicular, and axillary nodes normal. Resp: clear to auscultation bilaterally Back: symmetric, no curvature. ROM normal. No CVA tenderness. Cardio: regular rate and rhythm, S1, S2 normal, no murmur, click, rub or gallop GI: soft, non-tender; bowel sounds normal; no masses,  no organomegaly Extremities: extremities normal, atraumatic, no cyanosis or edema  ECOG PERFORMANCE STATUS: 1 - Symptomatic but completely ambulatory  Blood pressure 124/64, pulse 96, temperature 98.1 F (36.7 C), temperature source Temporal, resp. rate 18, height 5' 6.5" (1.689 m), weight 94 lb 11.2 oz (43 kg), SpO2 100 %.  LABORATORY DATA: Lab Results  Component Value Date   WBC 5.0 05/27/2020   HGB 9.1 (L) 05/27/2020   HCT 30.4 (L) 05/27/2020   MCV 88.4 05/27/2020   PLT 148 (L) 05/27/2020      Chemistry      Component Value Date/Time   NA 141 05/20/2020 1140   K 3.7 05/20/2020 1140   CL 103 05/20/2020 1140   CO2 29 05/20/2020 1140  BUN 11 05/20/2020 1140   CREATININE 0.77 05/20/2020 1140      Component Value Date/Time   CALCIUM 8.2 (L) 05/20/2020 1140   ALKPHOS 60 05/20/2020 1140   AST 6 (L) 05/20/2020 1140   ALT 7 05/20/2020 1140   BILITOT 0.3 05/20/2020 1140       RADIOGRAPHIC STUDIES: CT Angio Chest PE W/Cm &/Or Wo Cm  Result Date:  05/13/2020 CLINICAL DATA:  Of breath.  History of lung carcinoma EXAM: CT ANGIOGRAPHY CHEST WITH CONTRAST TECHNIQUE: Multidetector CT imaging of the chest was performed using the standard protocol during bolus administration of intravenous contrast. Multiplanar CT image reconstructions and MIPs were obtained to evaluate the vascular anatomy. CONTRAST:  167mL OMNIPAQUE IOHEXOL 350 MG/ML SOLN COMPARISON:  Chest CT Apr 12, 2020; chest radiograph May 13, 2020 FINDINGS: Cardiovascular: There is no demonstrable pulmonary embolus. There is no thoracic aortic aneurysm or dissection. There are scattered foci of aortic atherosclerosis. There are foci of great vessel calcification. There are scattered foci of coronary artery calcification. No pericardial effusion or pericardial thickening. Mediastinum/Nodes: Thyroid appears unremarkable. There is apparent adenopathy arising in the right paratracheal region at the site of consolidation/mass in the right upper lobe. This enlarged lymph node measures 1.9 x 1.6 cm, slightly smaller than on previous study. Scattered subcentimeter lymph nodes are also noted in the mediastinum. No new adenopathy evident. No esophageal lesions are evident. Lungs/Pleura: There is underlying centrilobular emphysematous change. The mass/consolidation with cavitation in the anterior segment left upper lobe is again noted which extends to the left paratracheal region and extends to and appears to invade pleura. This mass/consolidation measures 8.3 x 6.4 x 5.9 cm. There are nodular areas within the cavitation, likely foci of neoplasm. The vascularity of this area appear slightly less pronounced than on the previous study. In addition, there is consolidation with air bronchograms in the lingula, slightly smaller than on prior study, currently there is scarring in the lung bases. There is an area of apparent atelectasis in the anterior segment of the left lower lobe. There is a stable nodular opacity in the  superior segment of the right lower lobe measuring 6 x 6 mm, seen on axial slice 98 series 10, stable. No new areas of opacity are evident. Bullous changes noted in the right lower lobe medially. Measuring 3.3 x 2.8 x 2.8 cm. This opacity abuts the pleura and possibly may invade the pleura. Upper Abdomen: There is reflux of contrast into the inferior vena cava and hepatic veins. Visualized upper abdominal structures otherwise appear normal. Musculoskeletal: Collapse of the T10 vertebral body is again noted with increase in sclerosis in this vertebral body, concerning for metastatic focus. No other bone lesions. No chest wall lesions. Review of the MIP images confirms the above findings. IMPRESSION: 1. No demonstrable pulmonary embolus. No thoracic aortic aneurysm or dissection. There is aortic atherosclerosis as well as foci of great vessel and coronary artery calcification. 2. Cavitary mass in the right upper lobe abuts and may invade the pleura as well as extends to the right paratracheal region. Nearby adenopathy in the right paratracheal region is slightly smaller compared to previous study. Nodular opacities within the cavity likely represent metastatic foci. 3. Slightly smaller focus of consolidation in the lingula which does extend to the pleura but does not overtly invade the pleura. Question neoplastic focus in this area. 4. Underlying centrilobular emphysema. With scattered areas of scarring and atelectasis. 5. No new adenopathy. Paratracheal lymph node slightly smaller than on previous  study. 6. 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 7. Reflux of contrast into the inferior vena cava and hepatic veins likely is indicative of increase in right heart pressure. 8. Increased sclerosis at the site of collapsed T10 vertebral body. Concern for metastatic involvement of this vertebral body. Aortic Atherosclerosis (ICD10-I70.0) and Emphysema (ICD10-J43.9). Electronically Signed   By:  Lowella Grip III M.D.   On: 05/13/2020 19:45   DG Chest Portable 1 View  Result Date: 05/13/2020 CLINICAL DATA:  There is aortic atherosclerosis.  No bone lesions. EXAM: PORTABLE CHEST 1 VIEW COMPARISON:  Chest CT Apr 12, 2020 FINDINGS: There is underlying emphysematous change. There is consolidation with cavitation in the anterior segment of the right upper lobe, essentially stable. There is scarring with consolidation in anterior aspect of the left upper lobe. The lungs elsewhere are clear. Heart size is within normal limits. The pulmonary vascular flex underlying emphysematous change. Adenopathy in the right hilar region is better seen on recent CT. IMPRESSION: Underlying emphysematous change. Consolidation with cavitation in the anterior segment right upper lobe persists with concern for underlying mass. Scarring and ill-defined opacity in the anterior segment left upper lobe is likewise stable. No new opacity evident. Heart size normal. Right hilar adenopathy better appreciated on recent CT. There is aortic atherosclerosis. Overall appearance cyst essentially stable compared to 1 month prior. Aortic Atherosclerosis (ICD10-I70.0) and Emphysema (ICD10-J43.9). Electronically Signed   By: Lowella Grip III M.D.   On: 05/13/2020 15:52    ASSESSMENT AND PLAN: This is a very pleasant 79 years old African-American male recently diagnosed with stage IIIc non-small cell lung cancer, squamous cell carcinoma presented with right upper lobe lung mass in addition to ipsilateral and contralateral hilar lymphadenopathy diagnosed in April 2021. The patient is currently undergoing a course of concurrent chemoradiation with weekly carboplatin for AUC of 2 and paclitaxel 45 mg/M2 status post 3 cycles.  He has been tolerating his treatment well with no concerning adverse effects. I recommended for him to proceed with cycle #4 today as planned. He will come back for follow-up visit in 2 weeks for evaluation before  starting cycle #6. The patient was advised to call immediately if he has any concerning symptoms in the interval. The patient voices understanding of current disease status and treatment options and is in agreement with the current care plan. All questions were answered. The patient knows to call the clinic with any problems, questions or concerns. We can certainly see the patient much sooner if necessary.  Disclaimer: This note was dictated with voice recognition software. Similar sounding words can inadvertently be transcribed and may not be corrected upon review.

## 2020-05-27 NOTE — Patient Instructions (Signed)
[facility] Discharge Instructions for Patients Receiving Chemotherapy  Today you received the following chemotherapy agents Carboplatin  To help prevent nausea and vomiting after your treatment, we encourage you to take your nausea medication. Do no take zofran for three days after treatment.   If you develop nausea and vomiting that is not controlled by your nausea medication, call the clinic.   BELOW ARE SYMPTOMS THAT SHOULD BE REPORTED IMMEDIATELY:  *FEVER GREATER THAN 100.5 F  *CHILLS WITH OR WITHOUT FEVER  NAUSEA AND VOMITING THAT IS NOT CONTROLLED WITH YOUR NAUSEA MEDICATION  *UNUSUAL SHORTNESS OF BREATH  *UNUSUAL BRUISING OR BLEEDING  TENDERNESS IN MOUTH AND THROAT WITH OR WITHOUT PRESENCE OF ULCERS  *URINARY PROBLEMS  *BOWEL PROBLEMS  UNUSUAL RASH Items with * indicate a potential emergency and should be followed up as soon as possible.  Feel free to call the clinic should you have any questions or concerns. The clinic phone number is [phone].  Please show the [facility] at check-in to the Emergency Department and triage nurse.

## 2020-05-27 NOTE — Progress Notes (Signed)
Nutrition follow-up completed with patient during infusion.  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 Julien Nordmann and Dr. Sondra Come. Weight improved and documented as 94.7 pounds on June 21 increased from 92.8 pounds June 15. He denies nutrition impact symptoms. Reports he drinks Ensure Enlive or Ensure Plus 1-2 bottles daily and has plenty of this from the [place]. He has no questions or concerns.  Estimated nutrition needs: 1200-1400 cal, 55-65 g protein, 1.4 L fluid.  Nutrition diagnosis: Unintended weight loss has improved.  Intervention: Encourage patient to continue the same strategies for increasing calories and protein in small frequent meals and snacks. Recommended Ensure Plus or Ensure Enlive twice daily between meals. Provided samples.  Monitoring, evaluation, goals: Patient will continue to tolerate increased calories and protein for weight gain/weight maintenance.  Next visit: Follow-up has been scheduled for Monday, June 28 during infusion.  **Disclaimer: This note was dictated with voice recognition software. Similar sounding words can inadvertently be transcribed and this note may contain transcription errors which may not have been corrected upon publication of note.**

## 2020-05-28 ENCOUNTER — Ambulatory Visit: Payer: No Typology Code available for payment source

## 2020-05-28 ENCOUNTER — Ambulatory Visit
Admission: RE | Admit: 2020-05-28 | Discharge: 2020-05-28 | Disposition: A | Discharge status: Home / Self Care | Payer: No Typology Code available for payment source | Source: Ambulatory Visit | Attending: Radiation Oncology | Admitting: Radiation Oncology

## 2020-05-28 ENCOUNTER — Other Ambulatory Visit: Payer: Self-pay

## 2020-05-28 DIAGNOSIS — Z5111 Encounter for antineoplastic chemotherapy: Secondary | ICD-10-CM | POA: Diagnosis not present

## 2020-05-29 ENCOUNTER — Ambulatory Visit
Admission: RE | Admit: 2020-05-29 | Discharge: 2020-05-29 | Disposition: A | Discharge status: Home / Self Care | Payer: No Typology Code available for payment source | Source: Ambulatory Visit | Attending: Radiation Oncology | Admitting: Radiation Oncology

## 2020-05-29 ENCOUNTER — Other Ambulatory Visit: Payer: Self-pay

## 2020-05-29 ENCOUNTER — Ambulatory Visit: Payer: No Typology Code available for payment source

## 2020-05-29 DIAGNOSIS — Z5111 Encounter for antineoplastic chemotherapy: Secondary | ICD-10-CM | POA: Diagnosis not present

## 2020-05-30 ENCOUNTER — Ambulatory Visit
Admission: RE | Admit: 2020-05-30 | Discharge: 2020-05-30 | Disposition: A | Discharge status: Home / Self Care | Payer: No Typology Code available for payment source | Source: Ambulatory Visit | Attending: Radiation Oncology | Admitting: Radiation Oncology

## 2020-05-30 ENCOUNTER — Ambulatory Visit: Payer: No Typology Code available for payment source

## 2020-05-30 DIAGNOSIS — Z5111 Encounter for antineoplastic chemotherapy: Secondary | ICD-10-CM | POA: Diagnosis not present

## 2020-05-31 ENCOUNTER — Ambulatory Visit: Payer: No Typology Code available for payment source

## 2020-05-31 ENCOUNTER — Ambulatory Visit
Admission: RE | Admit: 2020-05-31 | Discharge: 2020-05-31 | Disposition: A | Discharge status: Home / Self Care | Payer: No Typology Code available for payment source | Source: Ambulatory Visit | Attending: Radiation Oncology | Admitting: Radiation Oncology

## 2020-05-31 ENCOUNTER — Other Ambulatory Visit: Payer: Self-pay

## 2020-05-31 DIAGNOSIS — Z5111 Encounter for antineoplastic chemotherapy: Secondary | ICD-10-CM | POA: Diagnosis not present

## 2020-06-03 ENCOUNTER — Other Ambulatory Visit: Payer: Self-pay

## 2020-06-03 ENCOUNTER — Inpatient Hospital Stay: Payer: No Typology Code available for payment source

## 2020-06-03 ENCOUNTER — Inpatient Hospital Stay: Payer: No Typology Code available for payment source | Admitting: Nutrition

## 2020-06-03 ENCOUNTER — Ambulatory Visit: Payer: No Typology Code available for payment source

## 2020-06-03 ENCOUNTER — Ambulatory Visit
Admission: RE | Admit: 2020-06-03 | Discharge: 2020-06-03 | Disposition: A | Discharge status: Home / Self Care | Payer: No Typology Code available for payment source | Source: Ambulatory Visit | Attending: Radiation Oncology | Admitting: Radiation Oncology

## 2020-06-03 VITALS — BP 132/68 | HR 95 | Temp 98.5°F | Resp 20

## 2020-06-03 DIAGNOSIS — C3411 Malignant neoplasm of upper lobe, right bronchus or lung: Secondary | ICD-10-CM

## 2020-06-03 DIAGNOSIS — Z5111 Encounter for antineoplastic chemotherapy: Secondary | ICD-10-CM | POA: Diagnosis not present

## 2020-06-03 LAB — CBC WITH DIFFERENTIAL (CANCER CENTER ONLY)
Abs Immature Granulocytes: 0.01 10*3/uL (ref 0.00–0.07)
Basophils Absolute: 0 10*3/uL (ref 0.0–0.1)
Basophils Relative: 0 %
Eosinophils Absolute: 0 10*3/uL (ref 0.0–0.5)
Eosinophils Relative: 0 %
HCT: 32.2 % — ABNORMAL LOW (ref 39.0–52.0)
Hemoglobin: 9.7 g/dL — ABNORMAL LOW (ref 13.0–17.0)
Immature Granulocytes: 0 %
Lymphocytes Relative: 15 %
Lymphs Abs: 0.5 10*3/uL — ABNORMAL LOW (ref 0.7–4.0)
MCH: 26.9 pg (ref 26.0–34.0)
MCHC: 30.1 g/dL (ref 30.0–36.0)
MCV: 89.2 fL (ref 80.0–100.0)
Monocytes Absolute: 0.5 10*3/uL (ref 0.1–1.0)
Monocytes Relative: 15 %
Neutro Abs: 2.2 10*3/uL (ref 1.7–7.7)
Neutrophils Relative %: 70 %
Platelet Count: 104 10*3/uL — ABNORMAL LOW (ref 150–400)
RBC: 3.61 MIL/uL — ABNORMAL LOW (ref 4.22–5.81)
RDW: 20 % — ABNORMAL HIGH (ref 11.5–15.5)
WBC Count: 3.2 10*3/uL — ABNORMAL LOW (ref 4.0–10.5)
nRBC: 0 % (ref 0.0–0.2)

## 2020-06-03 LAB — CMP (CANCER CENTER ONLY)
ALT: 11 U/L (ref 0–44)
AST: 12 U/L — ABNORMAL LOW (ref 15–41)
Albumin: 2.8 g/dL — ABNORMAL LOW (ref 3.5–5.0)
Alkaline Phosphatase: 79 U/L (ref 38–126)
Anion gap: 9 (ref 5–15)
BUN: 11 mg/dL (ref 8–23)
CO2: 29 mmol/L (ref 22–32)
Calcium: 8.5 mg/dL — ABNORMAL LOW (ref 8.9–10.3)
Chloride: 104 mmol/L (ref 98–111)
Creatinine: 0.78 mg/dL (ref 0.61–1.24)
GFR, Est AFR Am: >60 mL/min (ref >60)
GFR, Estimated: >60 mL/min (ref >60)
Glucose, Bld: 102 mg/dL — ABNORMAL HIGH (ref 70–99)
Potassium: 3.8 mmol/L (ref 3.5–5.1)
Sodium: 142 mmol/L (ref 135–145)
Total Bilirubin: 0.3 mg/dL (ref 0.3–1.2)
Total Protein: 7 g/dL (ref 6.5–8.1)

## 2020-06-03 MED ORDER — SODIUM CHLORIDE 0.9 % IV SOLN
20.0000 mg | Freq: Once | INTRAVENOUS | Status: AC
  Administered 2020-06-03: 20 mg via INTRAVENOUS
  Filled 2020-06-03: qty 20

## 2020-06-03 MED ORDER — SODIUM CHLORIDE 0.9 % IV SOLN
123.8000 mg | Freq: Once | INTRAVENOUS | Status: AC
  Administered 2020-06-03: 120 mg via INTRAVENOUS
  Filled 2020-06-03: qty 12

## 2020-06-03 MED ORDER — PALONOSETRON HCL INJECTION 0.25 MG/5ML
0.2500 mg | Freq: Once | INTRAVENOUS | Status: AC
  Administered 2020-06-03: 0.25 mg via INTRAVENOUS

## 2020-06-03 MED ORDER — SODIUM CHLORIDE 0.9 % IV SOLN
Freq: Once | INTRAVENOUS | Status: AC
  Filled 2020-06-03: qty 250

## 2020-06-03 MED ORDER — PALONOSETRON HCL INJECTION 0.25 MG/5ML
INTRAVENOUS | Status: AC
  Filled 2020-06-03: qty 5

## 2020-06-03 NOTE — Progress Notes (Signed)
Brief nutrition follow-up completed with patient during infusion for non-small cell lung cancer. Last weight documented was 94.7 pounds on June 21. Patient denies nutrition impact symptoms. Reports he continues to drink Ensure Plus or Ensure Enlive and has adequate supplies.  He has no questions or concerns.  Nutrition diagnosis: Unintended weight loss improved.  Intervention: Provided support and education for patient to continue strategies for adequate calorie and protein intake.  I asked patient to contact me if he developed any questions or concerns.  Patient is agreeable.  Monitoring, evaluation, goals: Patient will continue to work to increase calories and protein to minimize weight loss.  Follow-up to be scheduled as needed.  **Disclaimer: This note was dictated with voice recognition software. Similar sounding words can inadvertently be transcribed and this note may contain transcription errors which may not have been corrected upon publication of note.**

## 2020-06-03 NOTE — Patient Instructions (Signed)
[facility] Discharge Instructions for Patients Receiving Chemotherapy  Today you received the following chemotherapy agents Carboplatin  To help prevent nausea and vomiting after your treatment, we encourage you to take your nausea medication as directed   If you develop nausea and vomiting that is not controlled by your nausea medication, call the clinic.   BELOW ARE SYMPTOMS THAT SHOULD BE REPORTED IMMEDIATELY:  *FEVER GREATER THAN 100.5 F  *CHILLS WITH OR WITHOUT FEVER  NAUSEA AND VOMITING THAT IS NOT CONTROLLED WITH YOUR NAUSEA MEDICATION  *UNUSUAL SHORTNESS OF BREATH  *UNUSUAL BRUISING OR BLEEDING  TENDERNESS IN MOUTH AND THROAT WITH OR WITHOUT PRESENCE OF ULCERS  *URINARY PROBLEMS  *BOWEL PROBLEMS  UNUSUAL RASH Items with * indicate a potential emergency and should be followed up as soon as possible.  Feel free to call the clinic should you have any questions or concerns. The clinic phone number is [phone].  Please show the CHEMO ALERT CARD at check-in to the Emergency Department and triage nurse.   

## 2020-06-04 ENCOUNTER — Other Ambulatory Visit: Payer: Self-pay

## 2020-06-04 ENCOUNTER — Ambulatory Visit
Admission: RE | Admit: 2020-06-04 | Discharge: 2020-06-04 | Disposition: A | Discharge status: Home / Self Care | Payer: No Typology Code available for payment source | Source: Ambulatory Visit | Attending: Radiation Oncology | Admitting: Radiation Oncology

## 2020-06-04 ENCOUNTER — Ambulatory Visit: Payer: No Typology Code available for payment source

## 2020-06-04 DIAGNOSIS — Z5111 Encounter for antineoplastic chemotherapy: Secondary | ICD-10-CM | POA: Diagnosis not present

## 2020-06-05 ENCOUNTER — Other Ambulatory Visit: Payer: Self-pay

## 2020-06-05 ENCOUNTER — Ambulatory Visit
Admission: RE | Admit: 2020-06-05 | Discharge: 2020-06-05 | Disposition: A | Discharge status: Home / Self Care | Payer: No Typology Code available for payment source | Source: Ambulatory Visit | Attending: Radiation Oncology | Admitting: Radiation Oncology

## 2020-06-05 ENCOUNTER — Ambulatory Visit: Payer: No Typology Code available for payment source

## 2020-06-05 DIAGNOSIS — Z5111 Encounter for antineoplastic chemotherapy: Secondary | ICD-10-CM | POA: Diagnosis not present

## 2020-06-06 ENCOUNTER — Other Ambulatory Visit: Payer: Self-pay

## 2020-06-06 ENCOUNTER — Ambulatory Visit
Admission: RE | Admit: 2020-06-06 | Discharge: 2020-06-06 | Disposition: A | Discharge status: Home / Self Care | Payer: No Typology Code available for payment source | Source: Ambulatory Visit | Attending: Radiation Oncology | Admitting: Radiation Oncology

## 2020-06-06 DIAGNOSIS — C3411 Malignant neoplasm of upper lobe, right bronchus or lung: Secondary | ICD-10-CM | POA: Insufficient documentation

## 2020-06-06 DIAGNOSIS — R590 Localized enlarged lymph nodes: Secondary | ICD-10-CM | POA: Diagnosis not present

## 2020-06-06 DIAGNOSIS — Z5111 Encounter for antineoplastic chemotherapy: Secondary | ICD-10-CM | POA: Insufficient documentation

## 2020-06-06 DIAGNOSIS — Z79899 Other long term (current) drug therapy: Secondary | ICD-10-CM | POA: Insufficient documentation

## 2020-06-07 ENCOUNTER — Ambulatory Visit
Admission: RE | Admit: 2020-06-07 | Discharge: 2020-06-07 | Disposition: A | Discharge status: Home / Self Care | Payer: No Typology Code available for payment source | Source: Ambulatory Visit | Attending: Radiation Oncology | Admitting: Radiation Oncology

## 2020-06-07 ENCOUNTER — Other Ambulatory Visit: Payer: Self-pay

## 2020-06-07 DIAGNOSIS — Z5111 Encounter for antineoplastic chemotherapy: Secondary | ICD-10-CM | POA: Diagnosis not present

## 2020-06-09 NOTE — Progress Notes (Signed)
[place] [address]  DIAGNOSIS: Stage IIICnon-small cell lung cancer, squamous cell carcinoma. He presented with a right upper lobe mass and precardial lymphadenopathy and ipsilateral and contralateral hilar lymphadenopathy. He was diagnosed in April 2021  PRIOR THERAPY: None  CURRENT THERAPY: Weekly concurrent chemoradiation with carboplatin for an AUC of 2 and paclitaxel 45 mg per metered squared.First dose expected on 04/29/2020. Status post 5 cycles. Taxol discontinued from treatment plan starting from cycle #3 due to adverse reaction.   INTERVAL HISTORY: George [name] 79 y.o. male returns to the clinic for a follow up visit. The patient is feeling well today without any concerning complaints. The patient continues to tolerate treatment with concurrent chemoradiation well. Denies any fever, chills, or night sweats. He is followed by nutrition. His weight is stable compared to last visit.  Denies any chest pain or hemoptysis.  His cough has improved. He reports his baseline shortness of breath with exertion. Denies any nausea, vomiting, diarrhea, or constipation. Denies any headache or visual changes. His last day of radiation is scheduled for 06/14/20. The patient is here today for evaluation prior to starting cycle # 6   MEDICAL HISTORY: Past Medical History:  Diagnosis Date  . Cancer [facility]) Prostate  . Hypertension     ALLERGIES:  is allergic to aspirin.  MEDICATIONS:  Current Outpatient Medications  Medication Sig Dispense Refill  . acetaminophen (TYLENOL) 500 MG tablet Take 500 mg by mouth every 6 (six) hours as needed for moderate pain.    . Iron Combinations (IRON COMPLEX PO) Take 1 tablet by mouth daily.     Marland Kitchen POTASSIUM PO Take 1 tablet by mouth daily.     . prochlorperazine (COMPAZINE) 10 MG tablet Take 1 tablet (10 mg total) by mouth every 6 (six) hours as needed.  30 tablet 2   No current facility-administered medications for this visit.   Facility-Administered Medications Ordered in Other Visits  Medication Dose Route Frequency Provider Last Rate Last Admin  . CARBOplatin (PARAPLATIN) 120 mg in sodium chloride 0.9 % 100 mL chemo infusion  120 mg Intravenous Once Curt Bears, MD        SURGICAL HISTORY: No past surgical history on file.  REVIEW OF SYSTEMS:   Review of Systems  Constitutional: Negative for appetite change, chills, fatigue, fever and unexpected weight change.  HENT: Negative for mouth sores, nosebleeds, sore throat and trouble swallowing.   Eyes: Negative for eye problems and icterus.  Respiratory: Positive for cough. Positive for dyspnea on exertion. Negative for hemoptysis and wheezing.   Cardiovascular: Negative for chest pain and leg swelling.  Gastrointestinal: Negative for abdominal pain, constipation, diarrhea, nausea and vomiting.  Genitourinary: Negative for bladder incontinence, difficulty urinating, dysuria, frequency and hematuria.   Musculoskeletal: Negative for back pain, gait problem, neck pain and neck stiffness.  Skin: Negative for itching and rash.  Neurological: Negative for dizziness, extremity weakness, gait problem, headaches, light-headedness and seizures.  Hematological: Negative for adenopathy. Does not bruise/bleed easily.  Psychiatric/Behavioral: Negative for confusion, depression and sleep disturbance. The patient is not nervous/anxious.     PHYSICAL EXAMINATION:  Blood pressure 137/81, pulse 98, temperature 98.1 F (36.7 C), temperature source Temporal, resp. rate 18, height 5' 6.5" (1.689 m), weight 94 lb 4.8 oz (42.8 kg), SpO2 100 %.  ECOG PERFORMANCE STATUS: 1 - Symptomatic but completely ambulatory  Physical Exam  Constitutional: Oriented to person, place, and time andcachectic  appearing maleand in no distress.  HENT:  Head: Normocephalic and atraumatic.  Mouth/Throat: Oropharynx is  clear and moist. No oropharyngeal exudate.  Eyes: Conjunctivae are normal. Right eye exhibits no discharge. Left eye exhibits no discharge. No scleral icterus.  Neck: Normal range of motion. Neck supple.  Cardiovascular: Normal rate, regular rhythm, normal heart sounds and intact distal pulses.  Pulmonary/Chest: Effort normal.Decreased breath sounds in all lung fields with mild wheezing. No respiratory distress. No rales.  Abdominal: Soft. Bowel sounds are normal. Exhibits no distension and no mass. There is no tenderness.  Musculoskeletal: Normal range of motion. Exhibits no edema.  Lymphadenopathy:  No cervical adenopathy.  Neurological: Alert and oriented to person, place, and time. Exhibits normal muscle tone. Gait normal. Coordination normal.  Skin: Skin is warm and dry. No rash noted. Not diaphoretic. No erythema. No pallor.  Psychiatric: Mood, memory and judgment normal.  Vitals reviewed  LABORATORY DATA: Lab Results  Component Value Date   WBC 2.7 (L) 06/11/2020   HGB 10.5 (L) 06/11/2020   HCT 34.6 (L) 06/11/2020   MCV 90.1 06/11/2020   PLT 93 (L) 06/11/2020      Chemistry      Component Value Date/Time   NA 140 06/11/2020 1017   K 4.0 06/11/2020 1017   CL 105 06/11/2020 1017   CO2 26 06/11/2020 1017   BUN 11 06/11/2020 1017   CREATININE 0.78 06/11/2020 1017      Component Value Date/Time   CALCIUM 9.0 06/11/2020 1017   ALKPHOS 82 06/11/2020 1017   AST 10 (L) 06/11/2020 1017   ALT 9 06/11/2020 1017   BILITOT 0.4 06/11/2020 1017       RADIOGRAPHIC STUDIES:  CT Angio Chest PE W/Cm &/Or Wo Cm  Result Date: 05/13/2020 CLINICAL DATA:  Of breath.  History of lung carcinoma EXAM: CT ANGIOGRAPHY CHEST WITH CONTRAST TECHNIQUE: Multidetector CT imaging of the chest was performed using the standard protocol during bolus administration of intravenous contrast. Multiplanar CT image reconstructions and MIPs were obtained to evaluate the vascular anatomy. CONTRAST:   184mL OMNIPAQUE IOHEXOL 350 MG/ML SOLN COMPARISON:  Chest CT Apr 12, 2020; chest radiograph May 13, 2020 FINDINGS: Cardiovascular: There is no demonstrable pulmonary embolus. There is no thoracic aortic aneurysm or dissection. There are scattered foci of aortic atherosclerosis. There are foci of great vessel calcification. There are scattered foci of coronary artery calcification. No pericardial effusion or pericardial thickening. Mediastinum/Nodes: Thyroid appears unremarkable. There is apparent adenopathy arising in the right paratracheal region at the site of consolidation/mass in the right upper lobe. This enlarged lymph node measures 1.9 x 1.6 cm, slightly smaller than on previous study. Scattered subcentimeter lymph nodes are also noted in the mediastinum. No new adenopathy evident. No esophageal lesions are evident. Lungs/Pleura: There is underlying centrilobular emphysematous change. The mass/consolidation with cavitation in the anterior segment left upper lobe is again noted which extends to the left paratracheal region and extends to and appears to invade pleura. This mass/consolidation measures 8.3 x 6.4 x 5.9 cm. There are nodular areas within the cavitation, likely foci of neoplasm. The vascularity of this area appear slightly less pronounced than on the previous study. In addition, there is consolidation with air bronchograms in the lingula, slightly smaller than on prior study, currently there is scarring in the lung bases. There is an area of apparent atelectasis in the anterior segment of the left lower lobe. There is a stable nodular opacity in the superior segment of the right lower  lobe measuring 6 x 6 mm, seen on axial slice 98 series 10, stable. No new areas of opacity are evident. Bullous changes noted in the right lower lobe medially. Measuring 3.3 x 2.8 x 2.8 cm. This opacity abuts the pleura and possibly may invade the pleura. Upper Abdomen: There is reflux of contrast into the inferior vena  cava and hepatic veins. Visualized upper abdominal structures otherwise appear normal. Musculoskeletal: Collapse of the T10 vertebral body is again noted with increase in sclerosis in this vertebral body, concerning for metastatic focus. No other bone lesions. No chest wall lesions. Review of the MIP images confirms the above findings. IMPRESSION: 1. No demonstrable pulmonary embolus. No thoracic aortic aneurysm or dissection. There is aortic atherosclerosis as well as foci of great vessel and coronary artery calcification. 2. Cavitary mass in the right upper lobe abuts and may invade the pleura as well as extends to the right paratracheal region. Nearby adenopathy in the right paratracheal region is slightly smaller compared to previous study. Nodular opacities within the cavity likely represent metastatic foci. 3. Slightly smaller focus of consolidation in the lingula which does extend to the pleura but does not overtly invade the pleura. Question neoplastic focus in this area. 4. Underlying centrilobular emphysema. With scattered areas of scarring and atelectasis. 5. No new adenopathy. Paratracheal lymph node slightly smaller than on previous study. 6. 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 7. Reflux of contrast into the inferior vena cava and hepatic veins likely is indicative of increase in right heart pressure. 8. Increased sclerosis at the site of collapsed T10 vertebral body. Concern for metastatic involvement of this vertebral body. Aortic Atherosclerosis (ICD10-I70.0) and Emphysema (ICD10-J43.9). Electronically Signed   By: Lowella Grip III M.D.   On: 05/13/2020 19:45   DG Chest Portable 1 View  Result Date: 05/13/2020 CLINICAL DATA:  There is aortic atherosclerosis.  No bone lesions. EXAM: PORTABLE CHEST 1 VIEW COMPARISON:  Chest CT Apr 12, 2020 FINDINGS: There is underlying emphysematous change. There is consolidation with cavitation in the anterior segment of the  right upper lobe, essentially stable. There is scarring with consolidation in anterior aspect of the left upper lobe. The lungs elsewhere are clear. Heart size is within normal limits. The pulmonary vascular flex underlying emphysematous change. Adenopathy in the right hilar region is better seen on recent CT. IMPRESSION: Underlying emphysematous change. Consolidation with cavitation in the anterior segment right upper lobe persists with concern for underlying mass. Scarring and ill-defined opacity in the anterior segment left upper lobe is likewise stable. No new opacity evident. Heart size normal. Right hilar adenopathy better appreciated on recent CT. There is aortic atherosclerosis. Overall appearance cyst essentially stable compared to 1 month prior. Aortic Atherosclerosis (ICD10-I70.0) and Emphysema (ICD10-J43.9). Electronically Signed   By: Lowella Grip III M.D.   On: 05/13/2020 15:52     ASSESSMENT/PLAN:  This is a very pleasant 79 year old African American male diagnosed with stage IIICnon-small cell lung cancer, squamous cell carcinoma. He presented with a right upper lobe mass precardial lymphadenopathy and ipsilateral and contralateral hilar lymphadenopathy. He also has a faint area of hypermetabolic activityleft femoral shaftthat Dr. Sondra Come did not feel was suspicious for metastasis. He was diagnosed in April 2021.  The patient is currently undergoing concurrent chemoradiation with weekly carboplatin for an AUC of 2 and paclitaxel 45 mg/m.He is status post 5 cycles and tolerated it fairly well without any concerning adverse effects. Taxol was  removed from his treatment plan due to adverse reaction.    The last day of the patient's radiation is scheduled to be on 7/9.   Labs were reviewed. He is ok to treat with his labs, specifically his platelets. Recommend that he proceed with cycle #6 today as scheduled.   I will arrange for a restaging CT scan of his chest in 4 weeks.    We will see him back for a follow up visit in 4 weeks for evaluation and to review his scan results.   The patient was advised to call immediately if he has any concerning symptoms in the interval. The patient voices understanding of current disease status and treatment options and is in agreement with the current care plan. All questions were answered. The patient knows to call the clinic with any problems, questions or concerns. We can certainly see the patient much sooner if necessary  Orders Placed This Encounter  Procedures  . CT Chest W Contrast    Standing Status:   Future    Standing Expiration Date:   06/11/2021    Order Specific Question:   ** REASON FOR EXAM (FREE TEXT)    Answer:   restaging lung cancer    Order Specific Question:   If indicated for the ordered procedure, I authorize the administration of contrast media per Radiology protocol    Answer:   Yes    Order Specific Question:   Preferred imaging location?    Answer:   [facility]    Order Specific Question:   Radiology Contrast Protocol - do NOT remove file path    Answer:   [email or web address]  . CBC with Differential ([facility] Only)    Standing Status:   Future    Standing Expiration Date:   06/11/2021  . CMP (Early only)    Standing Status:   Future    Standing Expiration Date:   06/11/2021     Sabre Leonetti L Rachelle Edwards, PA-C 06/11/20

## 2020-06-11 ENCOUNTER — Inpatient Hospital Stay: Payer: No Typology Code available for payment source

## 2020-06-11 ENCOUNTER — Other Ambulatory Visit: Payer: Self-pay

## 2020-06-11 ENCOUNTER — Inpatient Hospital Stay: Payer: No Typology Code available for payment source | Attending: Physician Assistant

## 2020-06-11 ENCOUNTER — Inpatient Hospital Stay (HOSPITAL_BASED_OUTPATIENT_CLINIC_OR_DEPARTMENT_OTHER): Payer: No Typology Code available for payment source | Admitting: Physician Assistant

## 2020-06-11 ENCOUNTER — Encounter: Payer: No Typology Code available for payment source | Admitting: Nutrition

## 2020-06-11 ENCOUNTER — Other Ambulatory Visit: Payer: Self-pay | Admitting: Radiation Oncology

## 2020-06-11 ENCOUNTER — Ambulatory Visit: Payer: No Typology Code available for payment source

## 2020-06-11 ENCOUNTER — Ambulatory Visit
Admission: RE | Admit: 2020-06-11 | Discharge: 2020-06-11 | Disposition: A | Discharge status: Home / Self Care | Payer: No Typology Code available for payment source | Source: Ambulatory Visit | Attending: Radiation Oncology | Admitting: Radiation Oncology

## 2020-06-11 VITALS — BP 137/81 | HR 98 | Temp 98.1°F | Resp 18 | Ht 66.5 in | Wt 94.3 lb

## 2020-06-11 DIAGNOSIS — R590 Localized enlarged lymph nodes: Secondary | ICD-10-CM | POA: Insufficient documentation

## 2020-06-11 DIAGNOSIS — Z5111 Encounter for antineoplastic chemotherapy: Secondary | ICD-10-CM

## 2020-06-11 DIAGNOSIS — C3411 Malignant neoplasm of upper lobe, right bronchus or lung: Secondary | ICD-10-CM

## 2020-06-11 DIAGNOSIS — Z79899 Other long term (current) drug therapy: Secondary | ICD-10-CM | POA: Insufficient documentation

## 2020-06-11 LAB — CBC WITH DIFFERENTIAL (CANCER CENTER ONLY)
Abs Immature Granulocytes: 0.01 10*3/uL (ref 0.00–0.07)
Basophils Absolute: 0 10*3/uL (ref 0.0–0.1)
Basophils Relative: 1 %
Eosinophils Absolute: 0 10*3/uL (ref 0.0–0.5)
Eosinophils Relative: 0 %
HCT: 34.6 % — ABNORMAL LOW (ref 39.0–52.0)
Hemoglobin: 10.5 g/dL — ABNORMAL LOW (ref 13.0–17.0)
Immature Granulocytes: 0 %
Lymphocytes Relative: 16 %
Lymphs Abs: 0.4 10*3/uL — ABNORMAL LOW (ref 0.7–4.0)
MCH: 27.3 pg (ref 26.0–34.0)
MCHC: 30.3 g/dL (ref 30.0–36.0)
MCV: 90.1 fL (ref 80.0–100.0)
Monocytes Absolute: 0.5 10*3/uL (ref 0.1–1.0)
Monocytes Relative: 17 %
Neutro Abs: 1.8 10*3/uL (ref 1.7–7.7)
Neutrophils Relative %: 66 %
Platelet Count: 93 10*3/uL — ABNORMAL LOW (ref 150–400)
RBC: 3.84 MIL/uL — ABNORMAL LOW (ref 4.22–5.81)
RDW: 21.6 % — ABNORMAL HIGH (ref 11.5–15.5)
WBC Count: 2.7 10*3/uL — ABNORMAL LOW (ref 4.0–10.5)
nRBC: 0 % (ref 0.0–0.2)

## 2020-06-11 LAB — CMP (CANCER CENTER ONLY)
ALT: 9 U/L (ref 0–44)
AST: 10 U/L — ABNORMAL LOW (ref 15–41)
Albumin: 3.1 g/dL — ABNORMAL LOW (ref 3.5–5.0)
Alkaline Phosphatase: 82 U/L (ref 38–126)
Anion gap: 9 (ref 5–15)
BUN: 11 mg/dL (ref 8–23)
CO2: 26 mmol/L (ref 22–32)
Calcium: 9 mg/dL (ref 8.9–10.3)
Chloride: 105 mmol/L (ref 98–111)
Creatinine: 0.78 mg/dL (ref 0.61–1.24)
GFR, Est AFR Am: >60 mL/min (ref >60)
GFR, Estimated: >60 mL/min (ref >60)
Glucose, Bld: 121 mg/dL — ABNORMAL HIGH (ref 70–99)
Potassium: 4 mmol/L (ref 3.5–5.1)
Sodium: 140 mmol/L (ref 135–145)
Total Bilirubin: 0.4 mg/dL (ref 0.3–1.2)
Total Protein: 7.7 g/dL (ref 6.5–8.1)

## 2020-06-11 MED ORDER — SODIUM CHLORIDE 0.9 % IV SOLN
20.0000 mg | Freq: Once | INTRAVENOUS | Status: AC
  Administered 2020-06-11: 20 mg via INTRAVENOUS
  Filled 2020-06-11: qty 20

## 2020-06-11 MED ORDER — SUCRALFATE 1 G PO TABS
1.0000 g | ORAL_TABLET | Freq: Three times a day (TID) | ORAL | 1 refills | Status: DC
Start: 2020-06-11 — End: 2021-05-05

## 2020-06-11 MED ORDER — PALONOSETRON HCL INJECTION 0.25 MG/5ML
INTRAVENOUS | Status: AC
  Filled 2020-06-11: qty 5

## 2020-06-11 MED ORDER — SODIUM CHLORIDE 0.9 % IV SOLN
123.8000 mg | Freq: Once | INTRAVENOUS | Status: AC
  Administered 2020-06-11: 120 mg via INTRAVENOUS
  Filled 2020-06-11: qty 12

## 2020-06-11 MED ORDER — SODIUM CHLORIDE 0.9 % IV SOLN
Freq: Once | INTRAVENOUS | Status: AC
  Filled 2020-06-11: qty 250

## 2020-06-11 MED ORDER — PALONOSETRON HCL INJECTION 0.25 MG/5ML
0.2500 mg | Freq: Once | INTRAVENOUS | Status: AC
  Administered 2020-06-11: 0.25 mg via INTRAVENOUS

## 2020-06-11 NOTE — Patient Instructions (Signed)
-  Today is your last day of chemotherapy with your current treatment.  -The plan moving forward is to finish your radiation treatment as scheduled this week.  -I am going to order another CT scan of your chest so we can see the progress of treatment. They (radiology) should call you to schedule this scan. I would like the scan to be done in 3-4 weeks from now. If you do not hear from them, please check your voicemail or call them and schedule your scan yourself. Their number is [phone]. All you would need to do is tell them your name or date of birth and that you want to scheduled your scan. Your family can help schedule your scan for you as well.  -We will see you back a few days after your scan to talk about what to do next in terms of treatment. Please make sure your scan is done before your next appointment with [place] in 4 weeks.

## 2020-06-11 NOTE — Progress Notes (Signed)
Ok to treat today 06/11/2020 with Platelets of 93 per [employer], [place]

## 2020-06-12 ENCOUNTER — Telehealth: Payer: Self-pay | Admitting: Physician Assistant

## 2020-06-12 ENCOUNTER — Ambulatory Visit: Payer: No Typology Code available for payment source

## 2020-06-12 ENCOUNTER — Ambulatory Visit
Admission: RE | Admit: 2020-06-12 | Discharge: 2020-06-12 | Disposition: A | Discharge status: Home / Self Care | Payer: No Typology Code available for payment source | Source: Ambulatory Visit | Attending: Radiation Oncology | Admitting: Radiation Oncology

## 2020-06-12 DIAGNOSIS — Z5111 Encounter for antineoplastic chemotherapy: Secondary | ICD-10-CM | POA: Diagnosis not present

## 2020-06-12 NOTE — Telephone Encounter (Signed)
Scheduled per 07/06 los, patient has been called and notified.  

## 2020-06-13 ENCOUNTER — Other Ambulatory Visit: Payer: Self-pay

## 2020-06-13 ENCOUNTER — Ambulatory Visit: Payer: No Typology Code available for payment source

## 2020-06-13 ENCOUNTER — Ambulatory Visit
Admission: RE | Admit: 2020-06-13 | Discharge: 2020-06-13 | Disposition: A | Discharge status: Home / Self Care | Payer: No Typology Code available for payment source | Source: Ambulatory Visit | Attending: Radiation Oncology | Admitting: Radiation Oncology

## 2020-06-13 DIAGNOSIS — Z5111 Encounter for antineoplastic chemotherapy: Secondary | ICD-10-CM | POA: Diagnosis not present

## 2020-06-14 ENCOUNTER — Encounter: Payer: Self-pay | Admitting: Radiation Oncology

## 2020-06-14 ENCOUNTER — Telehealth: Payer: Self-pay | Admitting: Physician Assistant

## 2020-06-14 ENCOUNTER — Ambulatory Visit
Admission: RE | Admit: 2020-06-14 | Discharge: 2020-06-14 | Disposition: A | Discharge status: Home / Self Care | Payer: No Typology Code available for payment source | Source: Ambulatory Visit | Attending: Radiation Oncology | Admitting: Radiation Oncology

## 2020-06-14 DIAGNOSIS — Z5111 Encounter for antineoplastic chemotherapy: Secondary | ICD-10-CM | POA: Diagnosis not present

## 2020-06-14 NOTE — Telephone Encounter (Signed)
Called the patient's niece to make sure she was aware that he completed chemo and that I am cancelling the appointment on 7/11. His next appointment will on 8/2 to review the results of his CT scan. I also gave her the number to radiology scheduling to make sure that that next scan is scheduled. Advised her to have her uncle complete radiation as scheduled today.

## 2020-06-17 ENCOUNTER — Inpatient Hospital Stay: Payer: No Typology Code available for payment source

## 2020-07-02 ENCOUNTER — Telehealth: Payer: Self-pay | Admitting: *Deleted

## 2020-07-02 NOTE — Telephone Encounter (Signed)
Called pt daughter to make aware of CT appt. Advised clear liquids ONLY 4 hrs prior to scan, appt 8/3 @730am . Advised to arrive 15 min early. Pt daughter verbalized understanding.

## 2020-07-08 ENCOUNTER — Inpatient Hospital Stay: Payer: No Typology Code available for payment source | Attending: Physician Assistant

## 2020-07-08 ENCOUNTER — Other Ambulatory Visit: Payer: Self-pay

## 2020-07-08 DIAGNOSIS — Z8546 Personal history of malignant neoplasm of prostate: Secondary | ICD-10-CM | POA: Diagnosis not present

## 2020-07-08 DIAGNOSIS — C3411 Malignant neoplasm of upper lobe, right bronchus or lung: Secondary | ICD-10-CM | POA: Diagnosis present

## 2020-07-08 DIAGNOSIS — Z5111 Encounter for antineoplastic chemotherapy: Secondary | ICD-10-CM | POA: Diagnosis present

## 2020-07-08 DIAGNOSIS — Z923 Personal history of irradiation: Secondary | ICD-10-CM | POA: Diagnosis not present

## 2020-07-08 DIAGNOSIS — Z79899 Other long term (current) drug therapy: Secondary | ICD-10-CM | POA: Diagnosis not present

## 2020-07-08 LAB — CMP (CANCER CENTER ONLY)
ALT: <6 U/L (ref 0–44)
AST: 11 U/L — ABNORMAL LOW (ref 15–41)
Albumin: 3 g/dL — ABNORMAL LOW (ref 3.5–5.0)
Alkaline Phosphatase: 59 U/L (ref 38–126)
Anion gap: 12 (ref 5–15)
BUN: 20 mg/dL (ref 8–23)
CO2: 26 mmol/L (ref 22–32)
Calcium: 10.2 mg/dL (ref 8.9–10.3)
Chloride: 101 mmol/L (ref 98–111)
Creatinine: 1 mg/dL (ref 0.61–1.24)
GFR, Est AFR Am: >60 mL/min (ref >60)
GFR, Estimated: >60 mL/min (ref >60)
Glucose, Bld: 129 mg/dL — ABNORMAL HIGH (ref 70–99)
Potassium: 3.7 mmol/L (ref 3.5–5.1)
Sodium: 139 mmol/L (ref 135–145)
Total Bilirubin: 0.7 mg/dL (ref 0.3–1.2)
Total Protein: 8.5 g/dL — ABNORMAL HIGH (ref 6.5–8.1)

## 2020-07-08 LAB — CBC WITH DIFFERENTIAL (CANCER CENTER ONLY)
Abs Immature Granulocytes: 0.01 10*3/uL (ref 0.00–0.07)
Basophils Absolute: 0 10*3/uL (ref 0.0–0.1)
Basophils Relative: 0 %
Eosinophils Absolute: 0 10*3/uL (ref 0.0–0.5)
Eosinophils Relative: 0 %
HCT: 38.3 % — ABNORMAL LOW (ref 39.0–52.0)
Hemoglobin: 11.8 g/dL — ABNORMAL LOW (ref 13.0–17.0)
Immature Granulocytes: 0 %
Lymphocytes Relative: 16 %
Lymphs Abs: 0.5 10*3/uL — ABNORMAL LOW (ref 0.7–4.0)
MCH: 27.9 pg (ref 26.0–34.0)
MCHC: 30.8 g/dL (ref 30.0–36.0)
MCV: 90.5 fL (ref 80.0–100.0)
Monocytes Absolute: 0.7 10*3/uL (ref 0.1–1.0)
Monocytes Relative: 23 %
Neutro Abs: 1.9 10*3/uL (ref 1.7–7.7)
Neutrophils Relative %: 61 %
Platelet Count: 208 10*3/uL (ref 150–400)
RBC: 4.23 MIL/uL (ref 4.22–5.81)
RDW: 19.6 % — ABNORMAL HIGH (ref 11.5–15.5)
WBC Count: 3.2 10*3/uL — ABNORMAL LOW (ref 4.0–10.5)
nRBC: 0 % (ref 0.0–0.2)

## 2020-07-09 ENCOUNTER — Ambulatory Visit (HOSPITAL_COMMUNITY)
Admission: RE | Admit: 2020-07-09 | Discharge: 2020-07-09 | Disposition: A | Discharge status: Home / Self Care | Payer: No Typology Code available for payment source | Source: Ambulatory Visit | Attending: Physician Assistant | Admitting: Physician Assistant

## 2020-07-09 ENCOUNTER — Encounter (HOSPITAL_COMMUNITY): Payer: Self-pay

## 2020-07-09 DIAGNOSIS — C3411 Malignant neoplasm of upper lobe, right bronchus or lung: Secondary | ICD-10-CM | POA: Diagnosis not present

## 2020-07-09 HISTORY — DX: Malignant neoplasm of unspecified part of unspecified bronchus or lung: C34.90

## 2020-07-09 MED ORDER — IOHEXOL 300 MG/ML  SOLN
75.0000 mL | Freq: Once | INTRAMUSCULAR | Status: AC | PRN
  Administered 2020-07-09: 75 mL via INTRAVENOUS

## 2020-07-09 MED ORDER — SODIUM CHLORIDE (PF) 0.9 % IJ SOLN
INTRAMUSCULAR | Status: AC
  Filled 2020-07-09: qty 50

## 2020-07-09 NOTE — Progress Notes (Signed)
George [name] [place] [id number]  DIAGNOSIS: Stage IIICnon-small cell lung cancer, squamous cell carcinoma. He presented with a right upper lobe mass and precardial lymphadenopathy and ipsilateral and contralateral hilar lymphadenopathy. He was diagnosed in April 2021  PRIOR THERAPY: 1) Weekly concurrent chemoradiation with carboplatin for an AUC of 2 and paclitaxel 45 mg per metered squared.Status post 6 cycles. Taxol discontinued from treatment plan starting from cycle #3 due to adverse reaction. Last dose of chemotherapy 06/11/2020. Last day of radiation 06/14/2020  CURRENT THERAPY: Consolidation immunotherapy with Imfinzi 1500 mg IV every 4 weeks.  First dose expected on 07/18/20.  INTERVAL HISTORY: George [name] 79 y.o. male returns to the clinic today for a follow-up visit.  The patient is feeling fairly well today without any concerning complaints except for weight loss. The patient is followed by a member of the nutritionist team. He lost an additional 9 lbs which is significant. He denies odynophagia or dysphagia. His appetite comes and goes. The patient's family brings food to him. Unclear whether the patient prepares the food or not. He ran out of boost/ensure. The patient completed concurrent chemoradiation in early July 2021.  His last day radiation was on 06/14/2020.  The patient denies any fever, chills, or night sweats.   The patient denies any chest pain or hemoptysis.  His cough has improved.  He reports his baseline dyspnea on exertion.  He denies any nausea, vomiting, diarrhea, or constipation.  He denies any headache or visual changes.  The patient recently had a restaging CT scan performed.  He is here today for evaluation and more detailed discussion about his current condition and recommended treatment options.  MEDICAL HISTORY: Past Medical History:  Diagnosis Date  . Cancer [facility] [place])  Prostate  . Hypertension   . Lung cancer ([place])     ALLERGIES:  is allergic to aspirin.  MEDICATIONS:  Current Outpatient Medications  Medication Sig Dispense Refill  . acetaminophen (TYLENOL) 500 MG tablet Take 500 mg by mouth every 6 (six) hours as needed for moderate pain.    . Iron Combinations (IRON COMPLEX PO) Take 1 tablet by mouth daily.     Marland Kitchen POTASSIUM PO Take 1 tablet by mouth daily.     . prochlorperazine (COMPAZINE) 10 MG tablet Take 1 tablet (10 mg total) by mouth every 6 (six) hours as needed. 30 tablet 2  . sucralfate (CARAFATE) 1 g tablet Take 1 tablet (1 g total) by mouth 4 (four) times daily -  with meals and at bedtime. Crush and dissolve in 15 mL of warm water prior to swallowing 60 tablet 1   No current facility-administered medications for this visit.   Facility-Administered Medications Ordered in Other Visits  Medication Dose Route Frequency Provider Last Rate Last Admin  . CARBOplatin (PARAPLATIN) 120 mg in sodium chloride 0.9 % 100 mL chemo infusion  120 mg Intravenous Once Curt Bears, MD        SURGICAL HISTORY: No past surgical history on file.  REVIEW OF SYSTEMS:   Review of Systems  Constitutional: Positive for weight loss. Negative for chills, fatigue, and fever HENT: Negative for mouth sores, nosebleeds, sore throat and trouble swallowing.   Eyes: Negative for eye problems and icterus.  Respiratory: Positive for baseline dyspnea on exertion. Negative for cough and hemoptysis and wheezing.   Cardiovascular: Negative for chest pain and leg swelling.  Gastrointestinal: Negative for abdominal pain, constipation, diarrhea, nausea  and vomiting.  Genitourinary: Negative for bladder incontinence, difficulty urinating, dysuria, frequency and hematuria.   Musculoskeletal: Negative for back pain, gait problem, neck pain and neck stiffness.  Skin: Negative for itching and rash.  Neurological: Negative for dizziness, extremity weakness, gait problem,  headaches, light-headedness and seizures.  Hematological: Negative for adenopathy. Does not bruise/bleed easily.  Psychiatric/Behavioral: Negative for confusion, depression and sleep disturbance. The patient is not nervous/anxious.      PHYSICAL EXAMINATION:  Blood pressure 102/73, pulse (!) 119, temperature 97.7 F (36.5 C), temperature source Temporal, resp. rate 18, height 5' 6.5" (1.689 m), weight 85 lb 12.8 oz (38.9 kg), SpO2 100 %.  ECOG PERFORMANCE STATUS: 1 - Symptomatic but completely ambulatory  Physical Exam  Constitutional: Oriented to person, place, and time and cachectic appearing male and in no distress.  HENT:  Head: Normocephalic and atraumatic.  Mouth/Throat: Oropharynx is clear and moist. No oropharyngeal exudate.  Eyes: Conjunctivae are normal. Right eye exhibits no discharge. Left eye exhibits no discharge. No scleral icterus.  Neck: Normal range of motion. Neck supple.  Cardiovascular: Normal rate, regular rhythm, normal heart sounds and intact distal pulses.   Pulmonary/Chest: Effort normal. Quiet breath sounds in all lung fields. No respiratory distress. No wheezes. No rales.  Abdominal: Soft. Bowel sounds are normal. Exhibits no distension and no mass. There is no tenderness.  Musculoskeletal: Normal range of motion. Exhibits no edema.  Lymphadenopathy:    No cervical adenopathy.  Neurological: Alert and oriented to person, place, and time. Exhibits normal muscle tone. Gait normal. Coordination normal.  Skin: Skin is warm and dry. No rash noted. Not diaphoretic. No erythema. No pallor.  Psychiatric: Mood, memory and judgment normal.  Vitals reviewed.  LABORATORY DATA: Lab Results  Component Value Date   WBC 3.2 (L) 07/08/2020   HGB 11.8 (L) 07/08/2020   HCT 38.3 (L) 07/08/2020   MCV 90.5 07/08/2020   PLT 208 07/08/2020      Chemistry      Component Value Date/Time   NA 139 07/08/2020 1008   K 3.7 07/08/2020 1008   CL 101 07/08/2020 1008   CO2 26  07/08/2020 1008   BUN 20 07/08/2020 1008   CREATININE 1.00 07/08/2020 1008      Component Value Date/Time   CALCIUM 10.2 07/08/2020 1008   ALKPHOS 59 07/08/2020 1008   AST 11 (L) 07/08/2020 1008   ALT <6 07/08/2020 1008   BILITOT 0.7 07/08/2020 1008       RADIOGRAPHIC STUDIES:  CT Chest W Contrast  Result Date: 07/09/2020 CLINICAL DATA:  Primary Cancer Type: Lung Imaging Indication: Assess response to therapy Interval therapy since last imaging? Yes Initial Cancer Diagnosis Date: 03/07/2020; Established by: Biopsy-proven Detailed Pathology: Stage IIIC non-small cell lung cancer, squamous cell carcinoma. Primary Tumor location: Right upper lobe. Surgeries: No thoracic. Chemotherapy: Yes; Ongoing? No; Most recent administration: 06/11/2020 Immunotherapy? No Radiation therapy? Yes; Date Range: 04/30/2020-06/14/2020; Target: Right lung. Other Cancers: Prostate cancer 2012. EXAM: CT CHEST WITH CONTRAST TECHNIQUE: Multidetector CT imaging of the chest was performed during intravenous contrast administration. CONTRAST:  74mL OMNIPAQUE IOHEXOL 300 MG/ML  SOLN COMPARISON:  Most recent CT chest 05/13/2020, 04/12/2020 04/25/2020 PET-CT, CT chest, 08/02/2016. FINDINGS: Cardiovascular: Aortic atherosclerosis. Normal heart size. Scattered left coronary artery calcifications. No pericardial effusion. Mediastinum/Nodes: Slight interval decrease in size of enlarged right hilar lymph nodes measuring 1.7 x 1.4 cm, previously up to 1.9 x 1.6 cm (series 2, image 76) and prominent pretracheal lymph nodes measuring up to  1.3 x 1.1 cm, previously 2.0 x 1.3 cm (series 2, image 73). Thyroid gland, trachea, and esophagus demonstrate no significant findings. Lungs/Pleura: Moderate centrilobular emphysema. Significant interval decrease in size of a thick-walled cavitary lesion of the medial right upper lobe, measuring approximately 3.5 x 3.3 cm, previously approximately 7.1 x 4.9 cm when measured similarly (series 7, image 70).  Interval improvement of consolidation of the peripheral left upper lobe and lingula seen on prior examination (series 7, image 95). Stable, benign 6 mm subpleural nodule of the right lower lobe (series 7, image 93). Interval resolution of previously seen small right pleural effusion. Upper Abdomen: No acute abnormality. Musculoskeletal: No chest wall mass or suspicious bone lesions identified. Redemonstrated wedge deformity of the T9 vertebral body. IMPRESSION: 1. Significant interval decrease in size of a thick-walled cavitary lesion of the medial right upper lobe, consistent with treatment response. 2. Interval decrease in size of right hilar and pretracheal lymph nodes. 3. Constellation of findings above is consistent with treatment response. 4. Interval improvement of consolidation of the peripheral left upper lobe and lingula seen on prior examination, consistent with improved nonspecific infection or inflammation. 5. Interval resolution of previously seen small right pleural effusion. 6. Emphysema (ICD10-J43.9). 7. Coronary artery disease.  Aortic Atherosclerosis (ICD10-I70.0). Electronically Signed   By: Eddie Candle M.D.   On: 07/09/2020 10:18     ASSESSMENT/PLAN:  This is a very pleasant 79 year old African American male diagnosed with stage IIICnon-small cell lung cancer, squamous cell carcinoma. He presented with a right upper lobe mass precardial lymphadenopathy and ipsilateral and contralateral hilar lymphadenopathy. He also has a faint area of hypermetabolic activityleft femoral shaftthat Dr. Sondra Come did not feel was suspicious for metastasis. He was diagnosed in April 2021.  The patient completed 6 cycles of weekly concurrent chemoradiation with carboplatin for an AUC of 2 and paclitaxel 45 mg/m.  His last dose of chemotherapy was on 06/11/2020.  The patient had a reaction to Taxol and this was removed from his treatment plan starting from cycle #3.  The patient recently had a restaging  CT scan performed.  Dr. Julien Nordmann personally and independently reviewed the scan and discussed the results with the patient today.  The scan showed improvement in his disease.  Dr. Julien Nordmann had a lengthy discussion with the patient about his current condition and recommended treatment options.  Dr. Julien Nordmann gave the patient the option of continuing on observation vs. Undergoing consolidation immunotherapy with Imfinzi 1500 mg IV every 4 weeks .  The patient is interested in this option and he is expected to receive his first dose of treatment on 07/18/2020.  I discussed with her the adverse effect of the immunotherapy including but not limited to immunotherapy mediated skin rash, diarrhea, inflammation of the lung, kidney, liver, thyroid or other endocrine dysfunction  We'll see the patient back for follow-up visit in 5 weeks for evaluation before starting cycle #2.  He was given a complementary box of ensure/boost at his appointment today for his weight loss. He was strongly encouraged to increase his caloric intake.   The patient was advised to call immediately if he has any concerning symptoms in the interval. The patient voices understanding of current disease status and treatment options and is in agreement with the current care plan. All questions were answered. The patient knows to call the clinic with any problems, questions or concerns. We can certainly see the patient much sooner if necessary      Orders Placed This  Encounter  Procedures  . CBC with Differential (Cancer Center Only)    Standing Status:   Standing    Number of Occurrences:   12    Standing Expiration Date:   07/11/2021  . CMP ([place] only)    Standing Status:   Standing    Number of Occurrences:   12    Standing Expiration Date:   07/11/2021  . TSH    Standing Status:   Standing    Number of Occurrences:   12    Standing Expiration Date:   07/11/2021     Tobe Sos Khelani Kops,  PA-C 07/11/20  ADDENDUM: Hematology/Oncology Attending: I had a face-to-face encounter with the patient today.  I recommended his care plan.  This is a very pleasant 79 years old African-American male diagnosed with stage IIIc non-small cell lung cancer, squamous cell carcinoma in April 2021.  The patient underwent a course of concurrent chemoradiation with weekly carboplatin and paclitaxel status post 6 cycles.  He tolerated his treatment fairly well with no concerning adverse effect except for mild fatigue and hypersensitivity to paclitaxel which was discontinued after cycle #3. The patient had repeat CT scan of the chest performed recently.  I personally and independently reviewed the scans and discussed the results with the patient today. His scan showed improvement of his disease after the concurrent chemoradiation. I had a lengthy discussion with the patient and his nephew about his current condition and treatment options.  I gave the patient the option of continuous observation and monitoring versus proceeding with consolidation treatment with immunotherapy with Imfinzi 1500 mg IV every 4 weeks for a total of 1 year unless the patient has toxicity or disease progression. I discussed with the patient the adverse effect of this treatment including but not limited to immunotherapy mediated skin rash, diarrhea, inflammation of the lung, kidney, liver, thyroid or other endocrine dysfunction. The patient would like to proceed with the consolidation treatment with immunotherapy and he is expected to start the first cycle of this treatment next week. I will see him back for follow-up visit in 5 weeks for evaluation with the start of cycle #2. For the malnutrition I was giving supplemental nutrition in the clinic to use at home. The patient was advised to call immediately if he has any concerning symptoms in the interval.  Disclaimer: This note was dictated with voice recognition software. Similar  sounding words can inadvertently be transcribed and may be missed upon review. Eilleen Kempf, MD 07/11/20

## 2020-07-11 ENCOUNTER — Other Ambulatory Visit: Payer: Self-pay

## 2020-07-11 ENCOUNTER — Inpatient Hospital Stay (HOSPITAL_BASED_OUTPATIENT_CLINIC_OR_DEPARTMENT_OTHER): Payer: No Typology Code available for payment source | Admitting: Physician Assistant

## 2020-07-11 ENCOUNTER — Telehealth: Payer: Self-pay | Admitting: Hematology and Oncology

## 2020-07-11 ENCOUNTER — Other Ambulatory Visit: Payer: Self-pay | Admitting: Internal Medicine

## 2020-07-11 ENCOUNTER — Encounter: Payer: Self-pay | Admitting: Nutrition

## 2020-07-11 ENCOUNTER — Encounter: Payer: Self-pay | Admitting: Physician Assistant

## 2020-07-11 ENCOUNTER — Telehealth: Payer: Self-pay | Admitting: Physician Assistant

## 2020-07-11 VITALS — BP 102/73 | HR 119 | Temp 97.7°F | Resp 18 | Ht 66.5 in | Wt 85.8 lb

## 2020-07-11 DIAGNOSIS — Z7189 Other specified counseling: Secondary | ICD-10-CM

## 2020-07-11 DIAGNOSIS — Z5111 Encounter for antineoplastic chemotherapy: Secondary | ICD-10-CM | POA: Diagnosis not present

## 2020-07-11 DIAGNOSIS — C3411 Malignant neoplasm of upper lobe, right bronchus or lung: Secondary | ICD-10-CM

## 2020-07-11 NOTE — Progress Notes (Signed)
DISCONTINUE ON PATHWAY REGIMEN - Non-Small Cell Lung     Administer weekly:     Paclitaxel      Carboplatin   **Always confirm dose/schedule in your pharmacy ordering system**  REASON: Continuation Of Treatment PRIOR TREATMENT: [id number]: Carboplatin AUC=2 + Paclitaxel 45 mg/m2 Weekly During Radiation TREATMENT RESPONSE: Partial Response (PR)  START ON PATHWAY REGIMEN - Non-Small Cell Lung     A cycle is every 28 days:     Durvalumab   **Always confirm dose/schedule in your pharmacy ordering system**  Patient Characteristics: Preoperative or Nonsurgical Candidate (Clinical Staging), Stage III - Nonsurgical Candidate (Nonsquamous and Squamous), PS = 0, 1 Therapeutic Status: Preoperative or Nonsurgical Candidate (Clinical Staging) AJCC T Category: cT4 AJCC N Category: cN3 AJCC M Category: cM0 AJCC 8 Stage Grouping: IIIC ECOG Performance Status: 1 Intent of Therapy: Curative Intent, Discussed with Patient

## 2020-07-11 NOTE — Telephone Encounter (Signed)
Scheduled per los. Gave avs and calendar  

## 2020-07-11 NOTE — Progress Notes (Signed)
Provided one complimentary case of ensure enlive. 

## 2020-07-11 NOTE — Patient Instructions (Signed)
-  We covered a lot of important information at your appointment today regarding what the treatment plan is moving forward. Here are the the main points that were discussed at your office visit with [place] today:  -The treatment will consist of a new medication. This is not chemotherapy. This new drug is a type of Immunotherapy called Imfinzi (Durvalumab).  -We are planning on starting your treatment next week on 07/15/20  -Your treatment will be given once every4 weeks. You will receive this treatment every four weeks for a total of 1 year (12 total treatments) unless you experience unacceptable toxicity or if there is evidence on your routine CT scans that the cancer is growing  -We will get a CT scan after every 3 treatments to check on the progress of treatment  Side Effects:  -The adverse effect of the immunotherapy including but not limited to immunotherapy mediated skin rash, diarrhea, inflammation of the lung, kidney, liver, thyroid or other endocrine dysfunction  Follow up:  -We will see you back for a follow up visit in 5 weeks when you come in for your second treatment.

## 2020-07-15 NOTE — Progress Notes (Signed)
Pharmacist Chemotherapy Monitoring - Initial Assessment    Anticipated start date: 07/19/2020   Regimen:  . Are orders appropriate based on the patient's diagnosis, regimen, and cycle? Yes . Does the plan date match the patient's scheduled date? Yes . Is the sequencing of drugs appropriate? Yes . Are the premedications appropriate for the patient's regimen? Yes . Prior Authorization for treatment is: Pending o If applicable, is the correct biosimilar selected based on the patient's insurance? not applicable  Organ Function and Labs: Marland Kitchen Are dose adjustments needed based on the patient's renal function, hepatic function, or hematologic function? Yes . Are appropriate labs ordered prior to the start of patient's treatment? Yes . Other organ system assessment, if indicated: N/A . The following baseline labs, if indicated, have been ordered: durvalumab: baseline TSH +/- T4  Dose Assessment: . Are the drug doses appropriate? Yes . Are the following correct: o Drug concentrations Yes o IV fluid compatible with drug Yes o Administration routes Yes o Timing of therapy Yes . If applicable, does the patient have documented access for treatment and/or plans for port-a-cath placement? no . If applicable, have lifetime cumulative doses been properly documented and assessed? not applicable Lifetime Dose Tracking  . Carboplatin: 600 mg = 0.01 % of the maximum lifetime dose of 999,999,999 mg  o   Toxicity Monitoring/Prevention: . The patient has the following take home antiemetics prescribed: Prochlorperazine . The patient has the following take home medications prescribed: N/A . Medication allergies and previous infusion related reactions, if applicable, have been reviewed and addressed. Yes . The patient's current medication list has been assessed for drug-drug interactions with their chemotherapy regimen. no significant drug-drug interactions were identified on review.  Order Review: . Are the  treatment plan orders signed? Yes . Is the patient scheduled to see a provider prior to their treatment? No  I verify that I have reviewed each item in the above checklist and answered each question accordingly.  George [name] D 07/15/2020 11:25 AM

## 2020-07-19 ENCOUNTER — Inpatient Hospital Stay: Payer: No Typology Code available for payment source

## 2020-07-19 ENCOUNTER — Other Ambulatory Visit: Payer: Self-pay

## 2020-07-19 VITALS — BP 101/72 | HR 95 | Temp 98.1°F | Resp 18

## 2020-07-19 DIAGNOSIS — C3411 Malignant neoplasm of upper lobe, right bronchus or lung: Secondary | ICD-10-CM

## 2020-07-19 DIAGNOSIS — Z5111 Encounter for antineoplastic chemotherapy: Secondary | ICD-10-CM | POA: Diagnosis not present

## 2020-07-19 LAB — CMP (CANCER CENTER ONLY)
ALT: <6 U/L (ref 0–44)
AST: 12 U/L — ABNORMAL LOW (ref 15–41)
Albumin: 2.7 g/dL — ABNORMAL LOW (ref 3.5–5.0)
Alkaline Phosphatase: 71 U/L (ref 38–126)
Anion gap: 10 (ref 5–15)
BUN: 15 mg/dL (ref 8–23)
CO2: 25 mmol/L (ref 22–32)
Calcium: 10 mg/dL (ref 8.9–10.3)
Chloride: 103 mmol/L (ref 98–111)
Creatinine: 0.81 mg/dL (ref 0.61–1.24)
GFR, Est AFR Am: >60 mL/min (ref >60)
GFR, Estimated: >60 mL/min (ref >60)
Glucose, Bld: 94 mg/dL (ref 70–99)
Potassium: 4.4 mmol/L (ref 3.5–5.1)
Sodium: 138 mmol/L (ref 135–145)
Total Bilirubin: 0.3 mg/dL (ref 0.3–1.2)
Total Protein: 7.6 g/dL (ref 6.5–8.1)

## 2020-07-19 LAB — TSH: TSH: 2.402 u[IU]/mL (ref 0.320–4.118)

## 2020-07-19 LAB — CBC WITH DIFFERENTIAL (CANCER CENTER ONLY)
Abs Immature Granulocytes: 0.01 10*3/uL (ref 0.00–0.07)
Basophils Absolute: 0 10*3/uL (ref 0.0–0.1)
Basophils Relative: 0 %
Eosinophils Absolute: 0 10*3/uL (ref 0.0–0.5)
Eosinophils Relative: 0 %
HCT: 35.6 % — ABNORMAL LOW (ref 39.0–52.0)
Hemoglobin: 10.8 g/dL — ABNORMAL LOW (ref 13.0–17.0)
Immature Granulocytes: 0 %
Lymphocytes Relative: 16 %
Lymphs Abs: 0.7 10*3/uL (ref 0.7–4.0)
MCH: 27.4 pg (ref 26.0–34.0)
MCHC: 30.3 g/dL (ref 30.0–36.0)
MCV: 90.4 fL (ref 80.0–100.0)
Monocytes Absolute: 0.8 10*3/uL (ref 0.1–1.0)
Monocytes Relative: 20 %
Neutro Abs: 2.5 10*3/uL (ref 1.7–7.7)
Neutrophils Relative %: 64 %
Platelet Count: 216 10*3/uL (ref 150–400)
RBC: 3.94 MIL/uL — ABNORMAL LOW (ref 4.22–5.81)
RDW: 19.3 % — ABNORMAL HIGH (ref 11.5–15.5)
WBC Count: 4 10*3/uL (ref 4.0–10.5)
nRBC: 0 % (ref 0.0–0.2)

## 2020-07-19 MED ORDER — SODIUM CHLORIDE 0.9 % IV SOLN
Freq: Once | INTRAVENOUS | Status: AC
  Filled 2020-07-19: qty 250

## 2020-07-19 MED ORDER — SODIUM CHLORIDE 0.9 % IV SOLN
1500.0000 mg | Freq: Once | INTRAVENOUS | Status: AC
  Administered 2020-07-19: 1500 mg via INTRAVENOUS
  Filled 2020-07-19: qty 30

## 2020-07-19 NOTE — Progress Notes (Signed)
First time Duvalumab given without complaints. Education and questions addressed.

## 2020-07-19 NOTE — Patient Instructions (Addendum)
Durvalumab injection What is this medicine? DURVALUMAB (dur VAL ue mab) is a monoclonal antibody. It is used to treat urothelial cancer and lung cancer. This medicine may be used for other purposes; ask your health care provider or pharmacist if you have questions. COMMON BRAND NAME(S): IMFINZI What should I tell my health care provider before I take this medicine? They need to know if you have any of these conditions:  diabetes  immune system problems  infection  inflammatory bowel disease  kidney disease  liver disease  lung or breathing disease  lupus  organ transplant  stomach or intestine problems  thyroid disease  an unusual or allergic reaction to durvalumab, other medicines, foods, dyes, or preservatives  pregnant or trying to get pregnant  breast-feeding How should I use this medicine? This medicine is for infusion into a vein. It is given by a health care professional in a hospital or clinic setting. A special MedGuide will be given to you before each treatment. Be sure to read this information carefully each time. Talk to your pediatrician regarding the use of this medicine in children. Special care may be needed. Overdosage: If you think you have taken too much of this medicine contact a poison control center or emergency room at once. NOTE: This medicine is only for you. Do not share this medicine with others. What if I miss a dose? It is important not to miss your dose. Call your doctor or health care professional if you are unable to keep an appointment. What may interact with this medicine? Interactions have not been studied. This list may not describe all possible interactions. Give your health care provider a list of all the medicines, herbs, non-prescription drugs, or dietary supplements you use. Also tell them if you smoke, drink alcohol, or use illegal drugs. Some items may interact with your medicine. What should I watch for while using this  medicine? This drug may make you feel generally unwell. Continue your course of treatment even though you feel ill unless your doctor tells you to stop. You may need blood work done while you are taking this medicine. Do not become pregnant while taking this medicine or for 3 months after stopping it. Women should inform their doctor if they wish to become pregnant or think they might be pregnant. There is a potential for serious side effects to an unborn child. Talk to your health care professional or pharmacist for more information. Do not breast-feed an infant while taking this medicine or for 3 months after stopping it. What side effects may I notice from receiving this medicine? Side effects that you should report to your doctor or health care professional as soon as possible:  allergic reactions like skin rash, itching or hives, swelling of the face, lips, or tongue  black, tarry stools  bloody or watery diarrhea  breathing problems  change in emotions or moods  change in sex drive  changes in vision  chest pain or chest tightness  chills  confusion  cough  facial flushing  fever  headache  signs and symptoms of high blood sugar such as dizziness; dry mouth; dry skin; fruity breath; nausea; stomach pain; increased hunger or thirst; increased urination  signs and symptoms of liver injury like dark yellow or brown urine; general ill feeling or flu-like symptoms; light-colored stools; loss of appetite; nausea; right upper belly pain; unusually weak or tired; yellowing of the eyes or skin  stomach pain  trouble passing urine or change in  the amount of urine  weight gain or weight loss Side effects that usually do not require medical attention (report these to your doctor or health care professional if they continue or are bothersome):  bone pain  constipation  loss of appetite  muscle pain  nausea  swelling of the ankles, feet, hands  tiredness This list  may not describe all possible side effects. Call your doctor for medical advice about side effects. You may report side effects to FDA at 1-800-FDA-1088. Where should I keep my medicine? This drug is given in a hospital or clinic and will not be stored at home. NOTE: This sheet is a summary. It may not cover all possible information. If you have questions about this medicine, talk to your doctor, pharmacist, or health care provider.  2020 Elsevier/Gold Standard (2017-02-02 19:25:04) [facility] Discharge Instructions for Patients Receiving Chemotherapy  Today you received the following chemotherapy agents Darvalumab  To help prevent nausea and vomiting after your treatment, we encourage you to take your nausea medication .   If you develop nausea and vomiting that is not controlled by your nausea medication, call the clinic.   BELOW ARE SYMPTOMS THAT SHOULD BE REPORTED IMMEDIATELY:  *FEVER GREATER THAN 100.5 F  *CHILLS WITH OR WITHOUT FEVER  NAUSEA AND VOMITING THAT IS NOT CONTROLLED WITH YOUR NAUSEA MEDICATION  *UNUSUAL SHORTNESS OF BREATH  *UNUSUAL BRUISING OR BLEEDING  TENDERNESS IN MOUTH AND THROAT WITH OR WITHOUT PRESENCE OF ULCERS  *URINARY PROBLEMS  *BOWEL PROBLEMS  UNUSUAL RASH Items with * indicate a potential emergency and should be followed up as soon as possible.  Feel free to call the clinic should you have any questions or concerns. The clinic phone number is [phone].  Please show the Will at check-in to the Emergency Department and triage nurse.

## 2020-07-25 ENCOUNTER — Other Ambulatory Visit: Payer: Self-pay

## 2020-07-25 ENCOUNTER — Encounter: Payer: Self-pay | Admitting: Radiation Oncology

## 2020-07-25 ENCOUNTER — Ambulatory Visit
Admission: RE | Admit: 2020-07-25 | Discharge: 2020-07-25 | Disposition: A | Discharge status: Home / Self Care | Payer: No Typology Code available for payment source | Source: Ambulatory Visit | Attending: Radiation Oncology | Admitting: Radiation Oncology

## 2020-07-25 VITALS — BP 107/64 | HR 99 | Temp 97.6°F | Resp 20 | Ht 66.5 in | Wt 88.0 lb

## 2020-07-25 DIAGNOSIS — I2510 Atherosclerotic heart disease of native coronary artery without angina pectoris: Secondary | ICD-10-CM | POA: Diagnosis not present

## 2020-07-25 DIAGNOSIS — I700 Atherosclerosis of aorta: Secondary | ICD-10-CM | POA: Insufficient documentation

## 2020-07-25 DIAGNOSIS — C3411 Malignant neoplasm of upper lobe, right bronchus or lung: Secondary | ICD-10-CM | POA: Diagnosis present

## 2020-07-25 DIAGNOSIS — Z9221 Personal history of antineoplastic chemotherapy: Secondary | ICD-10-CM | POA: Diagnosis not present

## 2020-07-25 DIAGNOSIS — J439 Emphysema, unspecified: Secondary | ICD-10-CM | POA: Diagnosis not present

## 2020-07-25 DIAGNOSIS — R222 Localized swelling, mass and lump, trunk: Secondary | ICD-10-CM | POA: Insufficient documentation

## 2020-07-25 DIAGNOSIS — Z923 Personal history of irradiation: Secondary | ICD-10-CM | POA: Diagnosis not present

## 2020-07-25 NOTE — Progress Notes (Signed)
Radiation Oncology         [phone] ________________________________  Name: George [name] MRN: [id number]  Date: 07/25/2020  DOB: August 04, 1941  Follow-Up Visit Note  CC: Clinic, [facility], FNP    ICD-10-CM   1. 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  04/30/2020 - 06/14/2020  60 Gy in 30 fractions directed at the right upper lobe mass mediastinal area and contralateral hilar region.  Narrative:  The patient returns today for routine follow-up. Since the end of treatment, the patient underwent a chest CT scan on 07/09/2020. Results showed a significant interval decrease in size of the thicken-walled cavitary lesion of the medial right upper lobe, consistent with treatment response. There was also an interval decrease in size of the right hilar and pretracheal lymph nodes, consistent with treatment response. Additionally, there was interval improvement of consolidation of the peripheral left upper lobe and lingula, consistent with improvement non-specific infection or inflammation. Finally, there was interval resolution of the previously seen small right pleural effusion.                   The patient was last seen by [facility], PA-C, on 07/11/2020. He completed six cycles of weekly concurrent chemoradiation with Carboplatin and Paclitaxel on 06/11/2020. He tolerated treatment fairly well with no concerning adverse effects except for mild fatigue and hypersensitivity to Paclitaxel, which was discontinued after cycle #3. He was started on consolidation immunotherapy with Imfinzi on 07/19/2020.  On review of systems, he reports mild fatigue. He denies dyspnea or pain within the chest area or hemoptysis.  ALLERGIES:  is allergic  to aspirin.  Meds: Current Outpatient Medications  Medication Sig Dispense Refill  . acetaminophen (TYLENOL) 500 MG tablet Take 500 mg by mouth every 6 (six) hours as needed for moderate pain.    . Iron Combinations (IRON COMPLEX PO) Take 1 tablet by mouth daily.     Marland Kitchen POTASSIUM PO Take 1 tablet by mouth daily.     . prochlorperazine (COMPAZINE) 10 MG tablet Take 1 tablet (10 mg total) by mouth every 6 (six) hours as needed. 30 tablet 2  . sucralfate (CARAFATE) 1 g tablet Take 1 tablet (1 g total) by mouth 4 (four) times daily -  with meals and at bedtime. Crush and dissolve in 15 mL of warm water prior to swallowing 60 tablet 1   No current facility-administered medications for this encounter.    Physical Findings: The patient is in no acute distress. Patient is alert and oriented.  height is 5' 6.5" (1.689 m) and weight is 88 lb (39.9 kg). His temperature is 97.6 F (36.4 C). His blood pressure is 107/64 and his pulse is 99. His respiration is 20 and oxygen saturation is 99%.   No significant changes. Lungs are clear to auscultation bilaterally. Heart has regular rate and rhythm. No palpable cervical, supraclavicular, or axillary adenopathy. Abdomen soft, non-tender, normal bowel sounds.   Lab Findings: Lab Results  Component Value Date   WBC 4.0 07/19/2020   HGB 10.8 (L) 07/19/2020   HCT 35.6 (L) 07/19/2020   MCV 90.4 07/19/2020   PLT 216 07/19/2020    Radiographic Findings: CT Chest W Contrast  Result Date: 07/09/2020 CLINICAL DATA:  Primary  Cancer Type: Lung Imaging Indication: Assess response to therapy Interval therapy since last imaging? Yes Initial Cancer Diagnosis Date: 03/07/2020; Established by: Biopsy-proven Detailed Pathology: Stage IIIC non-small cell lung cancer, squamous cell carcinoma. Primary Tumor location: Right upper lobe. Surgeries: No thoracic. Chemotherapy: Yes; Ongoing? No; Most recent administration: 06/11/2020 Immunotherapy? No Radiation therapy? Yes; Date  Range: 04/30/2020-06/14/2020; Target: Right lung. Other Cancers: Prostate cancer 2012. EXAM: CT CHEST WITH CONTRAST TECHNIQUE: Multidetector CT imaging of the chest was performed during intravenous contrast administration. CONTRAST:  47mL OMNIPAQUE IOHEXOL 300 MG/ML  SOLN COMPARISON:  Most recent CT chest 05/13/2020, 04/12/2020 04/25/2020 PET-CT, CT chest, 08/02/2016. FINDINGS: Cardiovascular: Aortic atherosclerosis. Normal heart size. Scattered left coronary artery calcifications. No pericardial effusion. Mediastinum/Nodes: Slight interval decrease in size of enlarged right hilar lymph nodes measuring 1.7 x 1.4 cm, previously up to 1.9 x 1.6 cm (series 2, image 76) and prominent pretracheal lymph nodes measuring up to 1.3 x 1.1 cm, previously 2.0 x 1.3 cm (series 2, image 73). Thyroid gland, trachea, and esophagus demonstrate no significant findings. Lungs/Pleura: Moderate centrilobular emphysema. Significant interval decrease in size of a thick-walled cavitary lesion of the medial right upper lobe, measuring approximately 3.5 x 3.3 cm, previously approximately 7.1 x 4.9 cm when measured similarly (series 7, image 70). Interval improvement of consolidation of the peripheral left upper lobe and lingula seen on prior examination (series 7, image 95). Stable, benign 6 mm subpleural nodule of the right lower lobe (series 7, image 93). Interval resolution of previously seen small right pleural effusion. Upper Abdomen: No acute abnormality. Musculoskeletal: No chest wall mass or suspicious bone lesions identified. Redemonstrated wedge deformity of the T9 vertebral body. IMPRESSION: 1. Significant interval decrease in size of a thick-walled cavitary lesion of the medial right upper lobe, consistent with treatment response. 2. Interval decrease in size of right hilar and pretracheal lymph nodes. 3. Constellation of findings above is consistent with treatment response. 4. Interval improvement of consolidation of the  peripheral left upper lobe and lingula seen on prior examination, consistent with improved nonspecific infection or inflammation. 5. Interval resolution of previously seen small right pleural effusion. 6. Emphysema (ICD10-J43.9). 7. Coronary artery disease.  Aortic Atherosclerosis (ICD10-I70.0). Electronically Signed   By: Eddie Candle M.D.   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 His most recent chest CT scan shows significant interval improvement in the size of the thick-walled cavitary lesion of the medial right upper lobe and a decrease in size of the right hilar and pretracheal lymph nodes, consistent with treatment response.   Plan: The patient is scheduled to follow-up with Dr. Julien Nordmann on 08/15/2020. He will continue immunotherapy with Imfinzi. He will follow-up with radiation oncology on a as needed basis.     Blair Promise, PhD, MD  This document serves as a record of services personally performed by Gery Pray, MD. It was created on his behalf by Clerance Lav, a trained medical scribe. The creation of this record is based on the scribe's personal observations and the provider's statements to them. This document has been checked and approved by the attending provider.

## 2020-07-25 NOTE — Progress Notes (Signed)
Patient here for a one month f/u visit with Dr. Sondra Come. Reports shortness of breath when arising in the morning. Reports coughing daily but it is nonproductive. He denies problems swallowing and states appetite is the same. He drinks 3 Ensures daily and snacks throughout the day.  BP 107/64 (BP Location: Left Arm, Patient Position: Sitting, Cuff Size: Normal)   Pulse 99   Temp 97.6 F (36.4 C)   Resp 20   Ht 5' 6.5" (1.689 m)   Wt 88 lb (39.9 kg)   SpO2 99%   BMI 13.99 kg/m   Wt Readings from Last 3 Encounters:  07/25/20 88 lb (39.9 kg)  07/11/20 85 lb 12.8 oz (38.9 kg)  06/11/20 94 lb 4.8 oz (42.8 kg)

## 2020-08-15 ENCOUNTER — Inpatient Hospital Stay: Payer: No Typology Code available for payment source | Attending: Radiation Oncology | Admitting: Internal Medicine

## 2020-08-15 ENCOUNTER — Encounter: Payer: Self-pay | Admitting: Internal Medicine

## 2020-08-15 ENCOUNTER — Inpatient Hospital Stay: Payer: No Typology Code available for payment source

## 2020-08-15 ENCOUNTER — Inpatient Hospital Stay: Payer: No Typology Code available for payment source | Admitting: Nutrition

## 2020-08-15 ENCOUNTER — Other Ambulatory Visit: Payer: Self-pay

## 2020-08-15 VITALS — BP 114/77 | HR 78 | Resp 18 | Ht 66.5 in | Wt 91.6 lb

## 2020-08-15 DIAGNOSIS — Z5111 Encounter for antineoplastic chemotherapy: Secondary | ICD-10-CM | POA: Diagnosis present

## 2020-08-15 DIAGNOSIS — C3411 Malignant neoplasm of upper lobe, right bronchus or lung: Secondary | ICD-10-CM

## 2020-08-15 DIAGNOSIS — Z79899 Other long term (current) drug therapy: Secondary | ICD-10-CM | POA: Diagnosis not present

## 2020-08-15 DIAGNOSIS — D696 Thrombocytopenia, unspecified: Secondary | ICD-10-CM | POA: Diagnosis not present

## 2020-08-15 DIAGNOSIS — Z5112 Encounter for antineoplastic immunotherapy: Secondary | ICD-10-CM

## 2020-08-15 LAB — CBC WITH DIFFERENTIAL (CANCER CENTER ONLY)
Abs Immature Granulocytes: 0.03 10*3/uL (ref 0.00–0.07)
Basophils Absolute: 0 10*3/uL (ref 0.0–0.1)
Basophils Relative: 0 %
Eosinophils Absolute: 0 10*3/uL (ref 0.0–0.5)
Eosinophils Relative: 0 %
HCT: 41.1 % (ref 39.0–52.0)
Hemoglobin: 12.5 g/dL — ABNORMAL LOW (ref 13.0–17.0)
Immature Granulocytes: 1 %
Lymphocytes Relative: 24 %
Lymphs Abs: 0.8 10*3/uL (ref 0.7–4.0)
MCH: 27.5 pg (ref 26.0–34.0)
MCHC: 30.4 g/dL (ref 30.0–36.0)
MCV: 90.5 fL (ref 80.0–100.0)
Monocytes Absolute: 0.5 10*3/uL (ref 0.1–1.0)
Monocytes Relative: 15 %
Neutro Abs: 2.1 10*3/uL (ref 1.7–7.7)
Neutrophils Relative %: 60 %
Platelet Count: 66 10*3/uL — ABNORMAL LOW (ref 150–400)
RBC: 4.54 MIL/uL (ref 4.22–5.81)
RDW: 18.5 % — ABNORMAL HIGH (ref 11.5–15.5)
WBC Count: 3.4 10*3/uL — ABNORMAL LOW (ref 4.0–10.5)
nRBC: 0 % (ref 0.0–0.2)

## 2020-08-15 LAB — CMP (CANCER CENTER ONLY)
ALT: 10 U/L (ref 0–44)
AST: 17 U/L (ref 15–41)
Albumin: 3.2 g/dL — ABNORMAL LOW (ref 3.5–5.0)
Alkaline Phosphatase: 81 U/L (ref 38–126)
Anion gap: 10 (ref 5–15)
BUN: 14 mg/dL (ref 8–23)
CO2: 25 mmol/L (ref 22–32)
Calcium: 9.4 mg/dL (ref 8.9–10.3)
Chloride: 106 mmol/L (ref 98–111)
Creatinine: 0.84 mg/dL (ref 0.61–1.24)
GFR, Est AFR Am: >60 mL/min (ref >60)
GFR, Estimated: >60 mL/min (ref >60)
Glucose, Bld: 80 mg/dL (ref 70–99)
Potassium: 4.3 mmol/L (ref 3.5–5.1)
Sodium: 141 mmol/L (ref 135–145)
Total Bilirubin: 0.5 mg/dL (ref 0.3–1.2)
Total Protein: 8.1 g/dL (ref 6.5–8.1)

## 2020-08-15 LAB — TSH: TSH: 1.898 u[IU]/mL (ref 0.320–4.118)

## 2020-08-15 MED ORDER — SODIUM CHLORIDE 0.9% FLUSH
10.0000 mL | INTRAVENOUS | Status: DC | PRN
  Filled 2020-08-15: qty 10

## 2020-08-15 MED ORDER — SODIUM CHLORIDE 0.9 % IV SOLN
1500.0000 mg | Freq: Once | INTRAVENOUS | Status: AC
  Administered 2020-08-15: 1500 mg via INTRAVENOUS
  Filled 2020-08-15: qty 30

## 2020-08-15 MED ORDER — HEPARIN SOD (PORK) LOCK FLUSH 100 UNIT/ML IV SOLN
500.0000 [IU] | Freq: Once | INTRAVENOUS | Status: DC | PRN
  Filled 2020-08-15: qty 5

## 2020-08-15 MED ORDER — SODIUM CHLORIDE 0.9 % IV SOLN
Freq: Once | INTRAVENOUS | Status: AC
  Filled 2020-08-15: qty 250

## 2020-08-15 NOTE — Progress Notes (Signed)
[place] [phone]   Fax:([phone]) [place] [address] [place] [address]  DIAGNOSIS: stage IIIC non-small cell lung cancer, squamous cell carcinoma.  He presented with a right upper lobe mass and precardial lymphadenopathy and ipsilateral and contralateral hilar lymphadenopathy.  He was diagnosed in April 2021  PRIOR THERAPY: 1) Weekly concurrent chemoradiation with carboplatin for an AUC of 2 and paclitaxel 45 mg per metered squared.Status post6cycles.Taxol discontinued from treatment plan starting from cycle #3 due to adverse reaction.Last dose of chemotherapy 06/11/2020. Last day of radiation 06/14/2020  CURRENT THERAPY: Consolidation immunotherapy with Imfinzi 1500 mg IV every 4 weeks.  First dose expected on 07/18/20.  Status post 1 cycle.  INTERVAL HISTORY: George [name] 79 y.o. male returns to the clinic today for follow-up visit.  The patient is feeling fine today with no concerning complaints.  He tolerated the first cycle of his consolidation treatment with immunotherapy fairly well.  He denied having any chest pain, shortness of breath, cough or hemoptysis.  He has no nausea, vomiting, diarrhea or constipation.  He has no bleeding, bruises or ecchymosis.  He takes Tylenol for pain management.  The patient is here today for evaluation before starting cycle #2 of his treatment with Imfinzi.   MEDICAL HISTORY: Past Medical History:  Diagnosis Date  . Cancer [facility]) Prostate  . Hypertension   . Lung cancer ([place])     ALLERGIES:  is allergic to aspirin.  MEDICATIONS:  Current Outpatient Medications  Medication Sig Dispense Refill  . acetaminophen (TYLENOL) 500 MG tablet Take 500 mg by mouth every 6 (six) hours as needed for moderate pain.    . Iron Combinations (IRON COMPLEX PO) Take 1 tablet by mouth daily.     Marland Kitchen POTASSIUM PO Take 1 tablet by mouth daily.     .  prochlorperazine (COMPAZINE) 10 MG tablet Take 1 tablet (10 mg total) by mouth every 6 (six) hours as needed. (Patient not taking: Reported on 07/25/2020) 30 tablet 2  . sucralfate (CARAFATE) 1 g tablet Take 1 tablet (1 g total) by mouth 4 (four) times daily -  with meals and at bedtime. Crush and dissolve in 15 mL of warm water prior to swallowing (Patient not taking: Reported on 07/25/2020) 60 tablet 1   No current facility-administered medications for this visit.    SURGICAL HISTORY: No past surgical history on file.  REVIEW OF SYSTEMS:  A comprehensive review of systems was negative except for: Constitutional: positive for fatigue   PHYSICAL EXAMINATION: General appearance: alert, cooperative, fatigued and no distress Head: Normocephalic, without obvious abnormality, atraumatic Neck: no adenopathy, no JVD, supple, symmetrical, trachea midline and thyroid not enlarged, symmetric, no tenderness/mass/nodules Lymph nodes: Cervical, supraclavicular, and axillary nodes normal. Resp: clear to auscultation bilaterally Back: symmetric, no curvature. ROM normal. No CVA tenderness. Cardio: regular rate and rhythm, S1, S2 normal, no murmur, click, rub or gallop GI: soft, non-tender; bowel sounds normal; no masses,  no organomegaly Extremities: extremities normal, atraumatic, no cyanosis or edema  ECOG PERFORMANCE STATUS: 1 - Symptomatic but completely ambulatory  Blood pressure 114/77, pulse 78, resp. rate 18, height 5' 6.5" (1.689 m), weight 91 lb 9.6 oz (41.5 kg), SpO2 99 %.  LABORATORY DATA: Lab Results  Component Value Date   WBC 3.4 (L) 08/15/2020   HGB 12.5 (L) 08/15/2020   HCT 41.1 08/15/2020   MCV 90.5 08/15/2020   PLT 66 (L)  08/15/2020      Chemistry      Component Value Date/Time   NA 138 07/19/2020 0921   K 4.4 07/19/2020 0921   CL 103 07/19/2020 0921   CO2 25 07/19/2020 0921   BUN 15 07/19/2020 0921   CREATININE 0.81 07/19/2020 0921      Component Value Date/Time    CALCIUM 10.0 07/19/2020 0921   ALKPHOS 71 07/19/2020 0921   AST 12 (L) 07/19/2020 0921   ALT <6 07/19/2020 0921   BILITOT 0.3 07/19/2020 0921       RADIOGRAPHIC STUDIES: No results found.  ASSESSMENT AND PLAN: This is a very pleasant 79 years old African-American male recently diagnosed with stage IIIc non-small cell lung cancer, squamous cell carcinoma presented with right upper lobe lung mass in addition to ipsilateral and contralateral hilar lymphadenopathy diagnosed in April 2021. Status post a course of concurrent chemoradiation with weekly carboplatin and paclitaxel for 6 cycles with partial response. The patient is currently undergoing consolidation treatment with immunotherapy with Imfinzi 1500 mg IV every 4 weeks status post 1 cycle.  He tolerated the first cycle of his treatment fairly well. I recommended for the patient to proceed with cycle #2 today as planned. He has thrombocytopenia and this could be drug-induced.  He is currently asymptomatic and has no bleeding issues.  I recommended for the patient to continue on observation for now and to call us immediately if he has any bleeding issues. He will come back for follow-up visit in 4 weeks for evaluation before starting cycle #3. The patient was advised to call immediately if he has any other concerning symptoms in the interval. The patient voices understanding of current disease status and treatment options and is in agreement with the current care plan. All questions were answered. The patient knows to call the clinic with any problems, questions or concerns. We can certainly see the patient much sooner if necessary.  Disclaimer: This note was dictated with voice recognition software. Similar sounding words can inadvertently be transcribed and may not be corrected upon review.

## 2020-08-15 NOTE — Progress Notes (Signed)
Unable to get accurate temperature reading on patient with oral and tympanic thermometers

## 2020-08-15 NOTE — Patient Instructions (Signed)
[facility] Discharge Instructions for Patients Receiving Chemotherapy  Today you received the following chemotherapy agents: Durvalumab  To help prevent nausea and vomiting after your treatment, we encourage you to take your nausea medication as needed   If you develop nausea and vomiting that is not controlled by your nausea medication, call the clinic.   BELOW ARE SYMPTOMS THAT SHOULD BE REPORTED IMMEDIATELY:  *FEVER GREATER THAN 100.5 F  *CHILLS WITH OR WITHOUT FEVER  NAUSEA AND VOMITING THAT IS NOT CONTROLLED WITH YOUR NAUSEA MEDICATION  *UNUSUAL SHORTNESS OF BREATH  *UNUSUAL BRUISING OR BLEEDING  TENDERNESS IN MOUTH AND THROAT WITH OR WITHOUT PRESENCE OF ULCERS  *URINARY PROBLEMS  *BOWEL PROBLEMS  UNUSUAL RASH Items with * indicate a potential emergency and should be followed up as soon as possible.  Feel free to call the clinic should you have any questions or concerns. The clinic phone number is [phone].  Please show the [place] at check-in to the Emergency Department and triage nurse.

## 2020-08-15 NOTE — Progress Notes (Signed)
Per Dr Julien Nordmann ,it is okay to treat pt today with Imfinzi and plts of 66k.

## 2020-08-15 NOTE — Progress Notes (Signed)
Nutrition follow-up completed with patient during infusion for non-small cell lung cancer. Weight improved and documented as 91.6 pounds on September 9 increased from 85.8 pounds August 5. Patient continues on immunotherapy. Denies nausea, vomiting, constipation, diarrhea. Labs were reviewed. Patient reports drinking Ensure Plus or Ensure Enlive 3 cartons daily. He has no questions or concerns.  Nutrition diagnosis: Unintended weight loss improved.  Intervention: Encourage patient to continue oral nutrition supplements 3 times daily between meals. Provided 1 complementary case of Ensure Enlive. Provided support and encouragement.  Monitoring, evaluation, goals: Patient will tolerate increased calories and protein to promote weight gain.  Next visit: To be scheduled with treatment as needed.  **Disclaimer: This note was dictated with voice recognition software. Similar sounding words can inadvertently be transcribed and this note may contain transcription errors which may not have been corrected upon publication of note.**

## 2020-09-11 NOTE — Progress Notes (Incomplete)
  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 He did report a frequent non-productive cough that was managed with prescription cough medicine, mild to moderate fatigue, an episode of chest tightness that spontaneously resolved, and occasional shortness of breath with exertion that did not worsen throughout treatment. He denied painful swallowing, skin changes, hemoptysis, issues with weight bearing, and poor appetite. However, he did state that food was sticking to the inside of his throat towards the end of treatment, so Carafate was prescribed.  CTA of chest was performed on 05/13/2020 secondary to persistent cough/shortness of breath. It showed a cavitary mass in the right upper lobe abutting and possibly invading the pleura. It also extended to the right paratracheal region. There was also noted to be nearby adenopathy in the right paratracheal region that was slightly smaller compared to previous study. Additionally, there were nodular opacities within the cavity that likely represented metastatic foci, a slightly smaller focus of consolidation in the  lingula that extended to the pleural but did not overtly invade the pleura (questioned neoplastic focus in that area), underlying centrilobular emphysema, a stable 6 x 6 mm probable small metastasis in the right lower lobe, and increased sclerosis at the site of the collapsed T10 vertebral body with concern for metastatic involvement. There was demonstrable pulmonary embolus, thoracic aortic aneurysm, or dissection.  Of note, treatment planning CT scan did not show any appreciable lesion along the proximal femur that was questioned on the PET scan, thus the patient did not undergo radiation treatment to his left femur.  Plan: The patient will follow-up with radiation oncology in one month .  ________________________________________________   Blair Promise, PhD, MD  This document serves as a record of services personally performed by Gery Pray, MD. It was created on his behalf by Clerance Lav, a trained medical scribe. The creation of this record is based on the scribe's personal observations and the provider's statements to them. This document has been checked and approved by the attending provider.

## 2020-09-12 ENCOUNTER — Inpatient Hospital Stay: Payer: No Typology Code available for payment source

## 2020-09-12 ENCOUNTER — Inpatient Hospital Stay: Payer: No Typology Code available for payment source | Attending: Radiation Oncology | Admitting: Physician Assistant

## 2020-09-12 DIAGNOSIS — Z9221 Personal history of antineoplastic chemotherapy: Secondary | ICD-10-CM | POA: Insufficient documentation

## 2020-09-12 DIAGNOSIS — C3411 Malignant neoplasm of upper lobe, right bronchus or lung: Secondary | ICD-10-CM | POA: Insufficient documentation

## 2020-09-12 DIAGNOSIS — Z79899 Other long term (current) drug therapy: Secondary | ICD-10-CM | POA: Insufficient documentation

## 2020-09-12 DIAGNOSIS — D696 Thrombocytopenia, unspecified: Secondary | ICD-10-CM | POA: Insufficient documentation

## 2020-09-12 DIAGNOSIS — I10 Essential (primary) hypertension: Secondary | ICD-10-CM | POA: Insufficient documentation

## 2020-09-12 DIAGNOSIS — Z923 Personal history of irradiation: Secondary | ICD-10-CM | POA: Insufficient documentation

## 2020-09-13 ENCOUNTER — Telehealth: Payer: Self-pay | Admitting: Physician Assistant

## 2020-09-13 NOTE — Telephone Encounter (Signed)
The patient's niece is the point of contact for his appointments. I called her and confirmed the appointment times for the patient for Monday 09/16/20. Discussed the first appointment is at 1:30 and to arrive 10-15 minutes early. She expressed understanding.

## 2020-09-13 NOTE — Progress Notes (Signed)
[place] [address]  DIAGNOSIS: Stage IIIC non-small cell lung cancer, squamous cell carcinoma.  He presented with a right upper lobe mass and precardial lymphadenopathy and ipsilateral and contralateral hilar lymphadenopathy.  He was diagnosed in April 2021  PRIOR THERAPY: Weekly concurrent chemoradiation with carboplatin for an AUC of 2 and paclitaxel 45 mg per metered squared. Status post 6 cycles. Taxol discontinued from treatment plan starting from cycle #3 due to adverse reaction. Last dose of chemotherapy 06/11/2020. Last day of radiation 06/14/2020  CURRENT THERAPY: Consolidation immunotherapy with Imfinzi 1500 mg IV every 4 weeks.  First dose on 07/19/20. Status post 2 cycles.   INTERVAL HISTORY: George [name] 79 y.o. male returns to the clinic for a follow up visit. The patient is feeling well today without any concerning complaints. The patient continues to tolerate treatment with consolidation immunotherapy with Imfinzi well without any adverse side effects. Denies any fever, chills, or night sweats. He had been losing weight when first diagnosed but he has started to gain weight. He is followed by the nutritionist team. Denies any chest pain or hemoptysis. He reports his baseline dyspnea on exertion and dry cough. Denies any nausea, vomiting, diarrhea, or constipation. Denies any headache or visual changes. Denies any rashes or skin changes. He denies any new medications. Denies recent viral infections. He denies any abnormal bleeding or bruising. Denies recent alcohol use. He denies any liver disease. The patient is here today for evaluation prior to starting cycle # 3   MEDICAL HISTORY: Past Medical History:  Diagnosis Date  . Cancer [facility]) Prostate  . Hypertension   . Lung cancer ([place])     ALLERGIES:  is allergic to aspirin.  MEDICATIONS:  Current Outpatient Medications   Medication Sig Dispense Refill  . acetaminophen (TYLENOL) 500 MG tablet Take 500 mg by mouth every 6 (six) hours as needed for moderate pain.    . Iron Combinations (IRON COMPLEX PO) Take 1 tablet by mouth daily.     Marland Kitchen POTASSIUM PO Take 1 tablet by mouth daily.     . prochlorperazine (COMPAZINE) 10 MG tablet Take 1 tablet (10 mg total) by mouth every 6 (six) hours as needed. 30 tablet 2  . sucralfate (CARAFATE) 1 g tablet Take 1 tablet (1 g total) by mouth 4 (four) times daily -  with meals and at bedtime. Crush and dissolve in 15 mL of warm water prior to swallowing 60 tablet 1   No current facility-administered medications for this visit.    SURGICAL HISTORY: No past surgical history on file.  REVIEW OF SYSTEMS:   Review of Systems  Constitutional: Negative for appetite change, chills, fatigue, fever and unexpected weight change.  HENT:   Negative for mouth sores, nosebleeds, sore throat and trouble swallowing.   Eyes: Negative for eye problems and icterus.  Respiratory: Negative for cough, hemoptysis, shortness of breath and wheezing.   Cardiovascular: Negative for chest pain and leg swelling.  Gastrointestinal: Negative for abdominal pain, constipation, diarrhea, nausea and vomiting.  Genitourinary: Negative for bladder incontinence, difficulty urinating, dysuria, frequency and hematuria.   Musculoskeletal: Negative for back pain, gait problem, neck pain and neck stiffness.  Skin: Negative for itching and rash.  Neurological: Negative for dizziness, extremity weakness, gait problem, headaches, light-headedness and seizures.  Hematological: Negative for adenopathy. Does not bruise/bleed easily.  Psychiatric/Behavioral: Negative for confusion, depression and sleep disturbance. The patient is not nervous/anxious.  PHYSICAL EXAMINATION:  Blood pressure 128/70, pulse 82, temperature 97.8 F (36.6 C), temperature source Tympanic, resp. rate 18, height 5' 6.5" (1.689 m), weight 92 lb  12.8 oz (42.1 kg), SpO2 97 %.  ECOG PERFORMANCE STATUS: 1 - Symptomatic but completely ambulatory  Physical Exam  Constitutional: Oriented to person, place, and time and cachetic appearing male and in no distress.  HENT:  Head: Normocephalic and atraumatic.  Mouth/Throat: Oropharynx is clear and moist. No oropharyngeal exudate.  Eyes: Conjunctivae are normal. Right eye exhibits no discharge. Left eye exhibits no discharge. No scleral icterus.  Neck: Normal range of motion. Neck supple.  Cardiovascular: Normal rate, regular rhythm, normal heart sounds and intact distal pulses.   Pulmonary/Chest: Effort normal. Quiet breath sounds in all lung fields.  No respiratory distress. No wheezes. No rales.  Abdominal: Soft. Bowel sounds are normal. Exhibits no distension and no mass. There is no tenderness.  Musculoskeletal: Normal range of motion. Exhibits no edema.  Lymphadenopathy:    No cervical adenopathy.  Neurological: Alert and oriented to person, place, and time. Exhibits muscle wasting. Examined in the wheelchair.  Skin: Skin is warm and dry. No rash noted. Not diaphoretic. No erythema. No pallor.  Psychiatric: Mood, memory and judgment normal.  Vitals reviewed.  LABORATORY DATA: Lab Results  Component Value Date   WBC 3.2 (L) 09/16/2020   HGB 13.5 09/16/2020   HCT 43.4 09/16/2020   MCV 88.9 09/16/2020   PLT 42 (L) 09/16/2020      Chemistry      Component Value Date/Time   NA 140 09/16/2020 1324   K 3.9 09/16/2020 1324   CL 105 09/16/2020 1324   CO2 29 09/16/2020 1324   BUN 12 09/16/2020 1324   CREATININE 0.87 09/16/2020 1324      Component Value Date/Time   CALCIUM 9.4 09/16/2020 1324   ALKPHOS 69 09/16/2020 1324   AST 13 (L) 09/16/2020 1324   ALT 8 09/16/2020 1324   BILITOT 0.5 09/16/2020 1324       RADIOGRAPHIC STUDIES:  No results found.   ASSESSMENT/PLAN:  This is a very pleasant 79 year old African American male diagnosed with stage IIIC non-small cell  lung cancer, squamous cell carcinoma.  He presented with a right upper lobe mass precardial lymphadenopathy and ipsilateral and contralateral hilar lymphadenopathy.  He also has a faint area of hypermetabolic activity left femoral shaft that Dr. Sondra Come did not feel was suspicious for metastasis.  He was diagnosed in April 2021.   The patient completed 6 cycles of weekly concurrent chemoradiation with carboplatin for an AUC of 2 and paclitaxel 45 mg/m.  His last dose of chemotherapy was on 06/11/2020.  The patient had a reaction to Taxol and this was removed from his treatment plan starting from cycle #3.  The patient is currently undergoing consolidation immunotherapy with Imfinzi 1500 mg IV every 4 weeks.  The patient is status post 2 cycles and he has been tolerating it well without any adverse side effects.    Labs were reviewed. His labs show persistent thrombocytopenia with a platelet count of 42k. His platelet count was 66k last visit. Dr. Julien Nordmann recommends that we hold his treatment today.   We will delay his treatment by 1 week. I have sent a medrol Dosepak to his pharmacy. He was advised to avoid tylenol, NSAIDs, herbal supplements, and alcohol.   We will see him back for follow-up visit for evaluation in 1 week for evaluation before considering starting cycle #3.  The patient was advised to call immediately if he has any concerning symptoms in the interval. The patient voices understanding of current disease status and treatment options and is in agreement with the current care plan. All questions were answered. The patient knows to call the clinic with any problems, questions or concerns. We can certainly see the patient much sooner if necessary  Orders Placed This Encounter  Procedures  . CT Chest W Contrast    Please call the patient's niece sabrina if you cannot reach the patient to schedule the scan.    Standing Status:   Future    Standing Expiration Date:   09/16/2021     Scheduling Instructions:     Please call the patient's niece sabrina if you cannot reach the patient to schedule the scan.    Order Specific Question:   If indicated for the ordered procedure, I authorize the administration of contrast media per Radiology protocol    Answer:   Yes    Order Specific Question:   Preferred imaging location?    Answer:   [place], PA-C 09/16/20

## 2020-09-16 ENCOUNTER — Other Ambulatory Visit: Payer: Self-pay

## 2020-09-16 ENCOUNTER — Inpatient Hospital Stay: Payer: No Typology Code available for payment source

## 2020-09-16 ENCOUNTER — Inpatient Hospital Stay (HOSPITAL_BASED_OUTPATIENT_CLINIC_OR_DEPARTMENT_OTHER): Payer: No Typology Code available for payment source | Admitting: Physician Assistant

## 2020-09-16 VITALS — BP 128/70 | HR 82 | Temp 97.8°F | Resp 18 | Ht 66.5 in | Wt 92.8 lb

## 2020-09-16 DIAGNOSIS — D696 Thrombocytopenia, unspecified: Secondary | ICD-10-CM | POA: Diagnosis not present

## 2020-09-16 DIAGNOSIS — Z923 Personal history of irradiation: Secondary | ICD-10-CM | POA: Diagnosis not present

## 2020-09-16 DIAGNOSIS — Z79899 Other long term (current) drug therapy: Secondary | ICD-10-CM | POA: Diagnosis not present

## 2020-09-16 DIAGNOSIS — C3411 Malignant neoplasm of upper lobe, right bronchus or lung: Secondary | ICD-10-CM

## 2020-09-16 DIAGNOSIS — Z9221 Personal history of antineoplastic chemotherapy: Secondary | ICD-10-CM | POA: Diagnosis not present

## 2020-09-16 DIAGNOSIS — I10 Essential (primary) hypertension: Secondary | ICD-10-CM | POA: Diagnosis not present

## 2020-09-16 DIAGNOSIS — Z5112 Encounter for antineoplastic immunotherapy: Secondary | ICD-10-CM | POA: Diagnosis not present

## 2020-09-16 LAB — CBC WITH DIFFERENTIAL (CANCER CENTER ONLY)
Abs Immature Granulocytes: 0.01 10*3/uL (ref 0.00–0.07)
Basophils Absolute: 0 10*3/uL (ref 0.0–0.1)
Basophils Relative: 0 %
Eosinophils Absolute: 0 10*3/uL (ref 0.0–0.5)
Eosinophils Relative: 0 %
HCT: 43.4 % (ref 39.0–52.0)
Hemoglobin: 13.5 g/dL (ref 13.0–17.0)
Immature Granulocytes: 0 %
Lymphocytes Relative: 22 %
Lymphs Abs: 0.7 10*3/uL (ref 0.7–4.0)
MCH: 27.7 pg (ref 26.0–34.0)
MCHC: 31.1 g/dL (ref 30.0–36.0)
MCV: 88.9 fL (ref 80.0–100.0)
Monocytes Absolute: 0.5 10*3/uL (ref 0.1–1.0)
Monocytes Relative: 16 %
Neutro Abs: 2 10*3/uL (ref 1.7–7.7)
Neutrophils Relative %: 62 %
Platelet Count: 42 10*3/uL — ABNORMAL LOW (ref 150–400)
RBC: 4.88 MIL/uL (ref 4.22–5.81)
RDW: 15.9 % — ABNORMAL HIGH (ref 11.5–15.5)
WBC Count: 3.2 10*3/uL — ABNORMAL LOW (ref 4.0–10.5)
nRBC: 0 % (ref 0.0–0.2)

## 2020-09-16 LAB — CMP (CANCER CENTER ONLY)
ALT: 8 U/L (ref 0–44)
AST: 13 U/L — ABNORMAL LOW (ref 15–41)
Albumin: 3.2 g/dL — ABNORMAL LOW (ref 3.5–5.0)
Alkaline Phosphatase: 69 U/L (ref 38–126)
Anion gap: 6 (ref 5–15)
BUN: 12 mg/dL (ref 8–23)
CO2: 29 mmol/L (ref 22–32)
Calcium: 9.4 mg/dL (ref 8.9–10.3)
Chloride: 105 mmol/L (ref 98–111)
Creatinine: 0.87 mg/dL (ref 0.61–1.24)
GFR, Estimated: >60 mL/min (ref >60)
Glucose, Bld: 94 mg/dL (ref 70–99)
Potassium: 3.9 mmol/L (ref 3.5–5.1)
Sodium: 140 mmol/L (ref 135–145)
Total Bilirubin: 0.5 mg/dL (ref 0.3–1.2)
Total Protein: 7.7 g/dL (ref 6.5–8.1)

## 2020-09-16 LAB — TSH: TSH: 1.746 u[IU]/mL (ref 0.320–4.118)

## 2020-09-16 NOTE — Patient Instructions (Addendum)
-  It was great seeing you today.  Your blood work today shows that your platelets are lower than expected.  Platelets help you clot your blood.  I am unsure why your platelets are low but it appears that they were on the low side at your last visit 5 weeks ago as well.  They are even lower today at 42 thousand today. -I reviewed your lab work with Dr. Julien Nordmann.  We will not give you your treatment today until your platelet count improves.  In the meantime, I sent a prescription for a Medrol Dosepak to your pharmacy to take which will hopefully help raise your platelet count. -I will reschedule your treatment for next week and we will recheck your platelets at that time -Please avoid any Tylenol, alcohol, or herbal supplements in the meantime.

## 2020-09-21 ENCOUNTER — Ambulatory Visit: Payer: No Typology Code available for payment source | Attending: Internal Medicine

## 2020-09-21 DIAGNOSIS — Z23 Encounter for immunization: Secondary | ICD-10-CM

## 2020-09-21 NOTE — Progress Notes (Signed)
   Covid-19 Vaccination Clinic  Name:  George [name]    MRN: [id number] DOB: 1941-07-09  09/21/2020  George [name] was observed post Covid-19 immunization for 15 minutes without incident. He was provided with Vaccine Information Sheet and instruction to access the V-Safe system.   George [name] was instructed to call 911 with any severe reactions post vaccine: Marland Kitchen Difficulty breathing  . Swelling of face and throat  . A fast heartbeat  . A bad rash all over body  . Dizziness and weakness

## 2020-10-07 ENCOUNTER — Inpatient Hospital Stay: Payer: No Typology Code available for payment source | Attending: Radiation Oncology | Admitting: Nutrition

## 2020-10-07 DIAGNOSIS — Z79899 Other long term (current) drug therapy: Secondary | ICD-10-CM | POA: Insufficient documentation

## 2020-10-07 DIAGNOSIS — Z923 Personal history of irradiation: Secondary | ICD-10-CM | POA: Insufficient documentation

## 2020-10-07 DIAGNOSIS — Z9221 Personal history of antineoplastic chemotherapy: Secondary | ICD-10-CM | POA: Insufficient documentation

## 2020-10-07 DIAGNOSIS — Z5112 Encounter for antineoplastic immunotherapy: Secondary | ICD-10-CM | POA: Insufficient documentation

## 2020-10-07 DIAGNOSIS — C3411 Malignant neoplasm of upper lobe, right bronchus or lung: Secondary | ICD-10-CM | POA: Insufficient documentation

## 2020-10-07 DIAGNOSIS — D696 Thrombocytopenia, unspecified: Secondary | ICD-10-CM | POA: Insufficient documentation

## 2020-10-07 NOTE — Progress Notes (Deleted)
[name] [address]  DIAGNOSIS: Stage IIICnon-small cell lung cancer, squamous cell carcinoma. He presented with a right upper lobe mass and precardial lymphadenopathy and ipsilateral and contralateral hilar lymphadenopathy. He was diagnosed in April 2021  PRIOR THERAPY: Weekly concurrent chemoradiation with carboplatin for an AUC of 2 and paclitaxel 45 mg per metered squared.Status post6cycles.Taxol discontinued from treatment plan starting from cycle #3 due to adverse reaction.Last dose of chemotherapy 06/11/2020. Last day of radiation 06/14/2020  CURRENT THERAPY: Consolidation immunotherapy with Imfinzi 1500 mg IV every 4 weeks. First dose on 07/19/20. Status post 2 cycles.   INTERVAL HISTORY: George [name] 79 y.o. male returns to clinic today for follow-up visit.  The patient is feeling _today without any concerning complaints except for _.  The patient's last cycle of immunotherapy was on 08/15/2020.  The patient was seen in the office on 09/16/2020.  At that visit, the patient's platelet count was low at 42,000.  The patient was given a Medrol Dosepak the plan was to delay his treatment till the following week on 09/23/20.  Unfortunately, the patient's appointments were not rescheduled and he has not had treatment in several weeks.  Today, the patient denies any fever, chills, night sweats, or weight loss.  He had been losing weight prior to his diagnosis but he has been starting to gain weight.  He denies any chest pain or hemoptysis.  He reports his baseline dyspnea on exertion and dry cough.  He denies any nausea, vomiting, diarrhea, or constipation.  He denies any headache or visual changes.  He denies any rashes or skin changes.  The patient recently had a restaging CT scan performed.  He is here today for evaluation and to review his scan results before consideration of starting  cycle #3.  MEDICAL HISTORY: Past Medical History:  Diagnosis Date  . Cancer [facility]) Prostate  . Hypertension   . Lung cancer ([place])     ALLERGIES:  is allergic to aspirin.  MEDICATIONS:  Current Outpatient Medications  Medication Sig Dispense Refill  . acetaminophen (TYLENOL) 500 MG tablet Take 500 mg by mouth every 6 (six) hours as needed for moderate pain.    . Iron Combinations (IRON COMPLEX PO) Take 1 tablet by mouth daily.     Marland Kitchen POTASSIUM PO Take 1 tablet by mouth daily.     . prochlorperazine (COMPAZINE) 10 MG tablet Take 1 tablet (10 mg total) by mouth every 6 (six) hours as needed. 30 tablet 2  . sucralfate (CARAFATE) 1 g tablet Take 1 tablet (1 g total) by mouth 4 (four) times daily -  with meals and at bedtime. Crush and dissolve in 15 mL of warm water prior to swallowing 60 tablet 1   No current facility-administered medications for this visit.    SURGICAL HISTORY: No past surgical history on file.  REVIEW OF SYSTEMS:   Review of Systems  Constitutional: Negative for appetite change, chills, fatigue, fever and unexpected weight change.  HENT:   Negative for mouth sores, nosebleeds, sore throat and trouble swallowing.   Eyes: Negative for eye problems and icterus.  Respiratory: Negative for cough, hemoptysis, shortness of breath and wheezing.   Cardiovascular: Negative for chest pain and leg swelling.  Gastrointestinal: Negative for abdominal pain, constipation, diarrhea, nausea and vomiting.  Genitourinary: Negative for bladder incontinence, difficulty urinating, dysuria, frequency and hematuria.   Musculoskeletal: Negative for back pain, gait problem, neck pain  and neck stiffness.  Skin: Negative for itching and rash.  Neurological: Negative for dizziness, extremity weakness, gait problem, headaches, light-headedness and seizures.  Hematological: Negative for adenopathy. Does not bruise/bleed easily.  Psychiatric/Behavioral: Negative for confusion, depression and sleep  disturbance. The patient is not nervous/anxious.     PHYSICAL EXAMINATION:  There were no vitals taken for this visit.  ECOG PERFORMANCE STATUS: {CHL ONC ECOG [id number]  Physical Exam  Constitutional: Oriented to person, place, and time and well-developed, well-nourished, and in no distress. No distress.  HENT:  Head: Normocephalic and atraumatic.  Mouth/Throat: Oropharynx is clear and moist. No oropharyngeal exudate.  Eyes: Conjunctivae are normal. Right eye exhibits no discharge. Left eye exhibits no discharge. No scleral icterus.  Neck: Normal range of motion. Neck supple.  Cardiovascular: Normal rate, regular rhythm, normal heart sounds and intact distal pulses.   Pulmonary/Chest: Effort normal and breath sounds normal. No respiratory distress. No wheezes. No rales.  Abdominal: Soft. Bowel sounds are normal. Exhibits no distension and no mass. There is no tenderness.  Musculoskeletal: Normal range of motion. Exhibits no edema.  Lymphadenopathy:    No cervical adenopathy.  Neurological: Alert and oriented to person, place, and time. Exhibits normal muscle tone. Gait normal. Coordination normal.  Skin: Skin is warm and dry. No rash noted. Not diaphoretic. No erythema. No pallor.  Psychiatric: Mood, memory and judgment normal.  Vitals reviewed.  LABORATORY DATA: Lab Results  Component Value Date   WBC 3.2 (L) 09/16/2020   HGB 13.5 09/16/2020   HCT 43.4 09/16/2020   MCV 88.9 09/16/2020   PLT 42 (L) 09/16/2020      Chemistry      Component Value Date/Time   NA 140 09/16/2020 1324   K 3.9 09/16/2020 1324   CL 105 09/16/2020 1324   CO2 29 09/16/2020 1324   BUN 12 09/16/2020 1324   CREATININE 0.87 09/16/2020 1324      Component Value Date/Time   CALCIUM 9.4 09/16/2020 1324   ALKPHOS 69 09/16/2020 1324   AST 13 (L) 09/16/2020 1324   ALT 8 09/16/2020 1324   BILITOT 0.5 09/16/2020 1324       RADIOGRAPHIC STUDIES:  No results found.   ASSESSMENT/PLAN:  This  is a very pleasant 79 year old African American male diagnosed with stage IIICnon-small cell lung cancer, squamous cell carcinoma. He presented with a right upper lobe mass precardial lymphadenopathy and ipsilateral and contralateral hilar lymphadenopathy. He also has a faint area of hypermetabolic activityleft femoral shaftthat Dr. Sondra Come did not feel was suspicious for metastasis. He was diagnosed in April 2021.  The patient completed 6 cycles of weekly concurrent chemoradiation with carboplatin for an AUC of 2 and paclitaxel 45 mg/m. His last dose of chemotherapy was on 06/11/2020. The patient had a reaction to Taxol and this was removed from his treatment plan starting from cycle #3.  The patient is currently undergoing consolidation immunotherapy with Imfinzi 1500 mg IV every 4 weeks.  The patient is status post 2 cycles and he has been tolerating it well without any adverse side effects.  At the patient's last appointment on 09/16/2020, the patient's labs showed thrombocytopenia.  The patient's treatment was supposed to be delayed by 1 week.  The patient completed his Medrol Dosepak as prescribed.  The patient recently had a restaging CT scan performed.  Dr. Earlie Server personally and independently reviewed the scan discussed results with the patient today.  The scan showed _.   Dr. Julien Nordmann recommends _.  The patient was advised to call immediately if he has any concerning symptoms in the interval. The patient voices understanding of current disease status and treatment options and is in agreement with the current care plan. All questions were answered. The patient knows to call the clinic with any problems, questions or concerns. We can certainly see the patient much sooner if necessary      No orders of the defined types were placed in this encounter.    Casidee Jann L Curt [place], PA-C 10/07/20

## 2020-10-08 ENCOUNTER — Encounter: Payer: Self-pay | Admitting: Nutrition

## 2020-10-08 NOTE — Progress Notes (Signed)
Patient did not show up for nutrition appointment. Rescheduled for Dec 7.

## 2020-10-09 ENCOUNTER — Ambulatory Visit (HOSPITAL_COMMUNITY)
Admission: RE | Admit: 2020-10-09 | Discharge: 2020-10-09 | Disposition: A | Discharge status: Home / Self Care | Payer: No Typology Code available for payment source | Source: Ambulatory Visit | Attending: Physician Assistant | Admitting: Physician Assistant

## 2020-10-09 ENCOUNTER — Other Ambulatory Visit: Payer: Self-pay

## 2020-10-09 DIAGNOSIS — C3411 Malignant neoplasm of upper lobe, right bronchus or lung: Secondary | ICD-10-CM | POA: Insufficient documentation

## 2020-10-09 MED ORDER — IOHEXOL 300 MG/ML  SOLN
75.0000 mL | Freq: Once | INTRAMUSCULAR | Status: AC | PRN
  Administered 2020-10-09: 75 mL via INTRAVENOUS

## 2020-10-10 ENCOUNTER — Ambulatory Visit: Payer: No Typology Code available for payment source | Admitting: Internal Medicine

## 2020-10-10 ENCOUNTER — Inpatient Hospital Stay (HOSPITAL_BASED_OUTPATIENT_CLINIC_OR_DEPARTMENT_OTHER): Payer: No Typology Code available for payment source | Admitting: Internal Medicine

## 2020-10-10 ENCOUNTER — Inpatient Hospital Stay: Payer: No Typology Code available for payment source

## 2020-10-10 ENCOUNTER — Other Ambulatory Visit: Payer: Self-pay | Admitting: Internal Medicine

## 2020-10-10 ENCOUNTER — Encounter: Payer: Self-pay | Admitting: Internal Medicine

## 2020-10-10 ENCOUNTER — Other Ambulatory Visit: Payer: No Typology Code available for payment source

## 2020-10-10 ENCOUNTER — Other Ambulatory Visit: Payer: Self-pay

## 2020-10-10 ENCOUNTER — Encounter: Payer: No Typology Code available for payment source | Admitting: Nutrition

## 2020-10-10 ENCOUNTER — Ambulatory Visit: Payer: No Typology Code available for payment source

## 2020-10-10 VITALS — BP 155/82 | HR 80 | Temp 97.2°F | Resp 18 | Ht 66.5 in | Wt 93.1 lb

## 2020-10-10 DIAGNOSIS — Z79899 Other long term (current) drug therapy: Secondary | ICD-10-CM | POA: Diagnosis not present

## 2020-10-10 DIAGNOSIS — C3411 Malignant neoplasm of upper lobe, right bronchus or lung: Secondary | ICD-10-CM

## 2020-10-10 DIAGNOSIS — D696 Thrombocytopenia, unspecified: Secondary | ICD-10-CM | POA: Diagnosis not present

## 2020-10-10 DIAGNOSIS — Z923 Personal history of irradiation: Secondary | ICD-10-CM | POA: Diagnosis not present

## 2020-10-10 DIAGNOSIS — Z5112 Encounter for antineoplastic immunotherapy: Secondary | ICD-10-CM | POA: Diagnosis not present

## 2020-10-10 DIAGNOSIS — Z9221 Personal history of antineoplastic chemotherapy: Secondary | ICD-10-CM | POA: Diagnosis not present

## 2020-10-10 LAB — CMP (CANCER CENTER ONLY)
ALT: <6 U/L (ref 0–44)
AST: 11 U/L — ABNORMAL LOW (ref 15–41)
Albumin: 3.4 g/dL — ABNORMAL LOW (ref 3.5–5.0)
Alkaline Phosphatase: 73 U/L (ref 38–126)
Anion gap: 9 (ref 5–15)
BUN: 10 mg/dL (ref 8–23)
CO2: 28 mmol/L (ref 22–32)
Calcium: 9.4 mg/dL (ref 8.9–10.3)
Chloride: 106 mmol/L (ref 98–111)
Creatinine: 0.8 mg/dL (ref 0.61–1.24)
GFR, Estimated: >60 mL/min (ref >60)
Glucose, Bld: 90 mg/dL (ref 70–99)
Potassium: 3.5 mmol/L (ref 3.5–5.1)
Sodium: 143 mmol/L (ref 135–145)
Total Bilirubin: 0.9 mg/dL (ref 0.3–1.2)
Total Protein: 7.6 g/dL (ref 6.5–8.1)

## 2020-10-10 LAB — TSH: TSH: 0.223 u[IU]/mL — ABNORMAL LOW (ref 0.320–4.118)

## 2020-10-10 LAB — CBC WITH DIFFERENTIAL (CANCER CENTER ONLY)
Abs Immature Granulocytes: 0.01 10*3/uL (ref 0.00–0.07)
Basophils Absolute: 0 10*3/uL (ref 0.0–0.1)
Basophils Relative: 0 %
Eosinophils Absolute: 0 10*3/uL (ref 0.0–0.5)
Eosinophils Relative: 0 %
HCT: 42.3 % (ref 39.0–52.0)
Hemoglobin: 13.2 g/dL (ref 13.0–17.0)
Immature Granulocytes: 0 %
Lymphocytes Relative: 16 %
Lymphs Abs: 0.6 10*3/uL — ABNORMAL LOW (ref 0.7–4.0)
MCH: 27.1 pg (ref 26.0–34.0)
MCHC: 31.2 g/dL (ref 30.0–36.0)
MCV: 86.9 fL (ref 80.0–100.0)
Monocytes Absolute: 0.5 10*3/uL (ref 0.1–1.0)
Monocytes Relative: 15 %
Neutro Abs: 2.5 10*3/uL (ref 1.7–7.7)
Neutrophils Relative %: 69 %
Platelet Count: 51 10*3/uL — ABNORMAL LOW (ref 150–400)
RBC: 4.87 MIL/uL (ref 4.22–5.81)
RDW: 15.8 % — ABNORMAL HIGH (ref 11.5–15.5)
WBC Count: 3.6 10*3/uL — ABNORMAL LOW (ref 4.0–10.5)
nRBC: 0 % (ref 0.0–0.2)

## 2020-10-10 MED ORDER — SODIUM CHLORIDE 0.9 % IV SOLN
Freq: Once | INTRAVENOUS | Status: AC
  Filled 2020-10-10: qty 250

## 2020-10-10 MED ORDER — SODIUM CHLORIDE 0.9 % IV SOLN
1500.0000 mg | Freq: Once | INTRAVENOUS | Status: AC
  Administered 2020-10-10: 1500 mg via INTRAVENOUS
  Filled 2020-10-10: qty 30

## 2020-10-10 NOTE — Progress Notes (Signed)
Per Dr. Julien Nordmann, pt is okay to treat with platelets = 51.

## 2020-10-10 NOTE — Progress Notes (Signed)
[facility] [phone]   Fax:([phone]) [address] [place] [address]  DIAGNOSIS: Stage IIIC non-small cell lung cancer, squamous cell carcinoma.  He presented with a right upper lobe mass and precardial lymphadenopathy and ipsilateral and contralateral hilar lymphadenopathy.  He was diagnosed in April 2021  PRIOR THERAPY: 1) Weekly concurrent chemoradiation with carboplatin for an AUC of 2 and paclitaxel 45 mg per metered squared.Status post6cycles.Taxol discontinued from treatment plan starting from cycle #3 due to adverse reaction.Last dose of chemotherapy 06/11/2020. Last day of radiation 06/14/2020  CURRENT THERAPY: Consolidation immunotherapy with Imfinzi 1500 mg IV every 4 weeks.  First dose expected on 07/18/20.  Status post 2 cycles.  INTERVAL HISTORY: George [name] 79 y.o. male returns to the clinic today for follow-up visit.  The patient is feeling fine today with no concerning complaints.  He denied having any chest pain, shortness of breath, cough or hemoptysis.  He has no nausea, vomiting, diarrhea or constipation.  He denied having any headache or visual changes.  He has been tolerating his treatment with Imfinzi fairly well.  His treatment was delayed because of thrombocytopenia and was treated with a course of prednisone with no improvement of his platelets count. He had repeat CT scan of the chest performed recently and the patient is here for evaluation and discussion of his scan results.  MEDICAL HISTORY: Past Medical History:  Diagnosis Date  . Cancer [facility]) Prostate  . Hypertension   . Lung cancer ([place])     ALLERGIES:  is allergic to aspirin.  MEDICATIONS:  Current Outpatient Medications  Medication Sig Dispense Refill  . acetaminophen (TYLENOL) 500 MG tablet Take 500 mg by mouth every 6 (six) hours as needed for moderate pain.    . Iron Combinations  (IRON COMPLEX PO) Take 1 tablet by mouth daily.     Marland Kitchen POTASSIUM PO Take 1 tablet by mouth daily.     . prochlorperazine (COMPAZINE) 10 MG tablet Take 1 tablet (10 mg total) by mouth every 6 (six) hours as needed. 30 tablet 2  . sucralfate (CARAFATE) 1 g tablet Take 1 tablet (1 g total) by mouth 4 (four) times daily -  with meals and at bedtime. Crush and dissolve in 15 mL of warm water prior to swallowing 60 tablet 1   No current facility-administered medications for this visit.    SURGICAL HISTORY: No past surgical history on file.  REVIEW OF SYSTEMS:  Constitutional: positive for fatigue Eyes: negative Ears, nose, mouth, throat, and face: negative Respiratory: negative Cardiovascular: negative Gastrointestinal: negative Genitourinary:negative Integument/breast: negative Hematologic/lymphatic: negative Musculoskeletal:negative Neurological: negative Behavioral/Psych: negative Endocrine: negative Allergic/Immunologic: negative   PHYSICAL EXAMINATION: General appearance: alert, cooperative, fatigued and no distress Head: Normocephalic, without obvious abnormality, atraumatic Neck: no adenopathy, no JVD, supple, symmetrical, trachea midline and thyroid not enlarged, symmetric, no tenderness/mass/nodules Lymph nodes: Cervical, supraclavicular, and axillary nodes normal. Resp: clear to auscultation bilaterally Back: symmetric, no curvature. ROM normal. No CVA tenderness. Cardio: regular rate and rhythm, S1, S2 normal, no murmur, click, rub or gallop GI: soft, non-tender; bowel sounds normal; no masses,  no organomegaly Extremities: extremities normal, atraumatic, no cyanosis or edema Neurologic: Alert and oriented X 3, normal strength and tone. Normal symmetric reflexes. Normal coordination and gait  ECOG PERFORMANCE STATUS: 1 - Symptomatic but completely ambulatory  Blood pressure (!) 155/82, pulse 80, temperature (!) 97.2 F (36.2 C), temperature source Tympanic,  resp. rate 18,  height 5' 6.5" (1.689 m), weight 93 lb 1.6 oz (42.2 kg), SpO2 99 %.  LABORATORY DATA: Lab Results  Component Value Date   WBC 3.6 (L) 10/10/2020   HGB 13.2 10/10/2020   HCT 42.3 10/10/2020   MCV 86.9 10/10/2020   PLT 51 (L) 10/10/2020      Chemistry      Component Value Date/Time   NA 140 09/16/2020 1324   K 3.9 09/16/2020 1324   CL 105 09/16/2020 1324   CO2 29 09/16/2020 1324   BUN 12 09/16/2020 1324   CREATININE 0.87 09/16/2020 1324      Component Value Date/Time   CALCIUM 9.4 09/16/2020 1324   ALKPHOS 69 09/16/2020 1324   AST 13 (L) 09/16/2020 1324   ALT 8 09/16/2020 1324   BILITOT 0.5 09/16/2020 1324       RADIOGRAPHIC STUDIES: CT Chest W Contrast  Result Date: 10/09/2020 CLINICAL DATA:  Non-small cell lung cancer of the right upper lobe EXAM: CT CHEST WITH CONTRAST TECHNIQUE: Multidetector CT imaging of the chest was performed during intravenous contrast administration. CONTRAST:  43mL OMNIPAQUE IOHEXOL 300 MG/ML  SOLN COMPARISON:  07/09/2020 FINDINGS: Cardiovascular: Coronary, aortic arch, and branch vessel atherosclerotic vascular disease. Mediastinum/Nodes: Lower paratracheal/precarinal lymph node 0.9 cm in short axis on image 68 of series 2, formerly 1.0 cm by my measurements. Right hilar node 1.3 cm in short axis on image 74 series 2, previously 1.4 cm. Lungs/Pleura: Centrilobular emphysema. Cavitary right upper lobe mass measures about 2.0 by 3.3 cm on image 66 of series 5 and by my measurements was previously 4.2 by 3.7 cm, a moderate reduction some of which is due to reduction in the central cavitary gas-filled portion, but some of which is also due to reduced wall thickening. Surrounding airspace opacity is similar except superiorly where there is some increased airspace opacity distal to the lesion which merits surveillance. Strictly speaking I cannot exclude superior/cephalad paramediastinal tumor extension although that would seem contrary to the otherwise reduced  size of the lesion and accordingly this anterior paramediastinal density extending cephalad from the lesion is more likely due to volume loss or inflammation. Stable 6 mm right lower lobe nodule on image 93 of series 5. Mildly improved band of airspace opacity in the left upper lobe compared to previous. Trace right pleural effusion. Upper Abdomen: Unremarkable Musculoskeletal: Old healed bilateral rib fractures. Old healed transverse sternal fracture. Stable compression fracture at T9 with associated mild sclerosis. IMPRESSION: 1. Moderate reduction in size of the cavitary right upper lobe mass, with some increased airspace opacity distal to the lesion which merits surveillance. 2. Stable 6 mm right lower lobe nodule. 3. Mildly improved band of airspace opacity in the left upper lobe compared to previous. 4. Minimally reduced paratracheal and right hilar adenopathy. 5. Trace right pleural effusion. 6. Coronary, aortic arch, and branch vessel atherosclerotic vascular disease. 7. Stable compression fracture at T9 with associated mild sclerosis. 8. Old healed bilateral rib fractures and old healed transverse sternal fracture. 9. Emphysema and aortic atherosclerosis. Aortic Atherosclerosis (ICD10-I70.0) and Emphysema (ICD10-J43.9). Electronically Signed   By: Van Clines M.D.   On: 10/09/2020 14:08    ASSESSMENT AND PLAN: This is a very pleasant 79 years old African-American male recently diagnosed with stage IIIc non-small cell lung cancer, squamous cell carcinoma presented with right upper lobe lung mass in addition to ipsilateral and contralateral hilar lymphadenopathy diagnosed in April 2021. Status post a course of concurrent chemoradiation with weekly  carboplatin and paclitaxel for 6 cycles with partial response. The patient is currently undergoing consolidation treatment with immunotherapy with Imfinzi 1500 mg IV every 4 weeks status post 2 cycles.  The patient is tolerating this treatment well with  no concerning adverse effects.  His treatment has been on hold because of thrombocytopenia that did not improve with treatment with the steroids. He had repeat CT scan of the chest performed recently.  I personally and independently reviewed the scans and discussed the results with the patient. His scan showed no concerning findings for disease progression. I recommended for the patient to resume his treatment with Imfinzi and he will proceed with cycle #3 today despite the low platelets count which we will monitor closely during his treatment. He will come back for follow-up visit in 4 weeks for evaluation before starting cycle #4. The patient was advised to call immediately if he has any concerning symptoms in the interval. The patient voices understanding of current disease status and treatment options and is in agreement with the current care plan. All questions were answered. The patient knows to call the clinic with any problems, questions or concerns. We can certainly see the patient much sooner if necessary.  Disclaimer: This note was dictated with voice recognition software. Similar sounding words can inadvertently be transcribed and may not be corrected upon review.

## 2020-10-10 NOTE — Patient Instructions (Signed)
[facility] Discharge Instructions for Patients Receiving Chemotherapy  Today you received the following chemotherapy agents: Imfinzi.  To help prevent nausea and vomiting after your treatment, we encourage you to take your nausea medication as directed.   If you develop nausea and vomiting that is not controlled by your nausea medication, call the clinic.   BELOW ARE SYMPTOMS THAT SHOULD BE REPORTED IMMEDIATELY:  *FEVER GREATER THAN 100.5 F  *CHILLS WITH OR WITHOUT FEVER  NAUSEA AND VOMITING THAT IS NOT CONTROLLED WITH YOUR NAUSEA MEDICATION  *UNUSUAL SHORTNESS OF BREATH  *UNUSUAL BRUISING OR BLEEDING  TENDERNESS IN MOUTH AND THROAT WITH OR WITHOUT PRESENCE OF ULCERS  *URINARY PROBLEMS  *BOWEL PROBLEMS  UNUSUAL RASH Items with * indicate a potential emergency and should be followed up as soon as possible.  Feel free to call the clinic should you have any questions or concerns. The clinic phone number is [phone].  Please show the CHEMO ALERT CARD at check-in to the Emergency Department and triage nurse.   

## 2020-10-13 IMAGING — CT CT ANGIO CHEST
2 of 6 series · 17 of 36 positions shown · IV contrast (omnipaque)
Comparison: Chest CT April 12, 2020; chest radiograph May 13, 2020

CLINICAL DATA: Of breath.  History of lung carcinoma

EXAM:
CT ANGIOGRAPHY CHEST WITH CONTRAST
TECHNIQUE: Multidetector CT imaging of the chest was performed using the
standard protocol during bolus administration of intravenous
contrast. Multiplanar CT image reconstructions and MIPs were
obtained to evaluate the vascular anatomy.
CONTRAST:  100mL OMNIPAQUE IOHEXOL 350 MG/ML SOLN

[Series 5: thins · axial · 0.62mm/px · z∈[+1477,+1778]mm · 16 of 339 slices shown]
[im 19/339  lung]
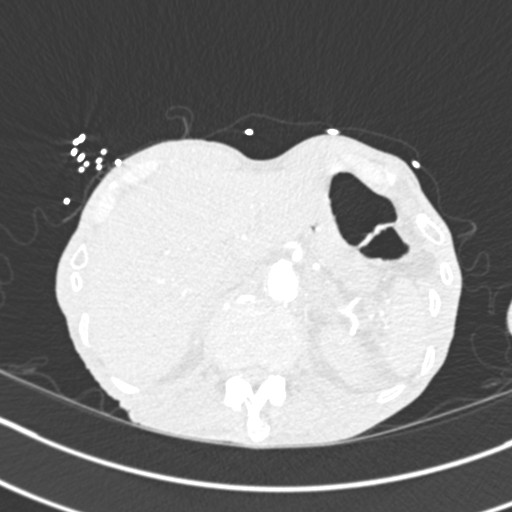
[im 38/339  mediastinal]
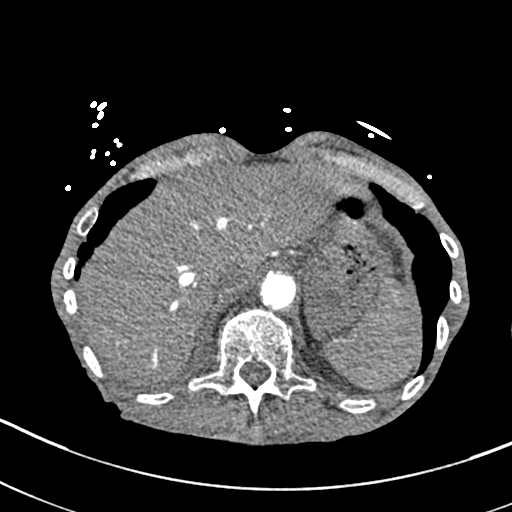
[im 57/339  lung]
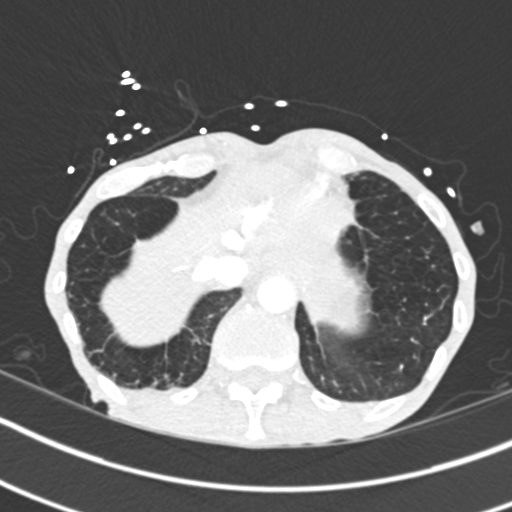
[im 76/339  mediastinal]
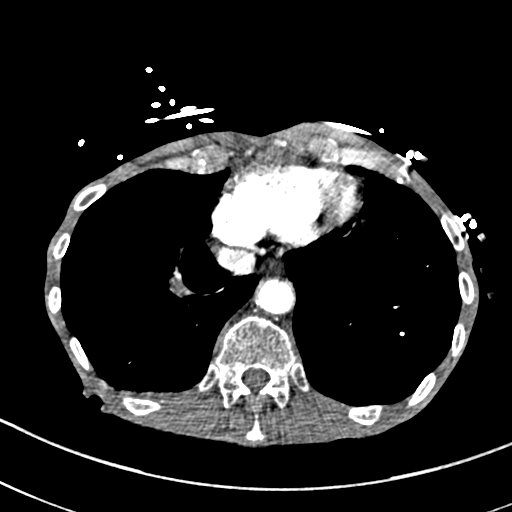
[im 94/339  lung]
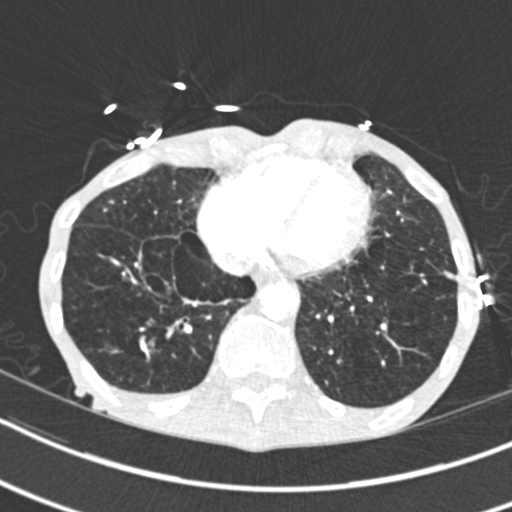
[im 113/339  mediastinal]
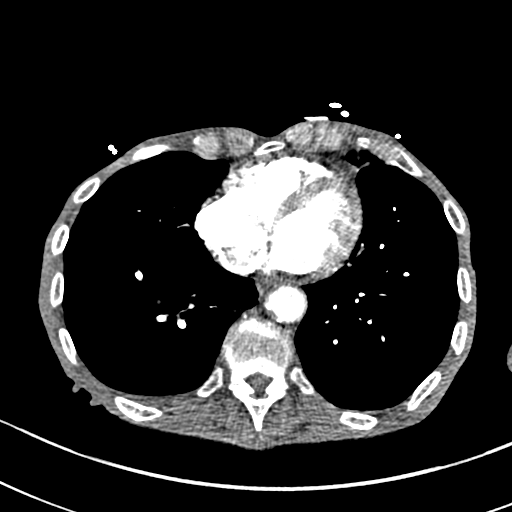
[im 132/339  lung]
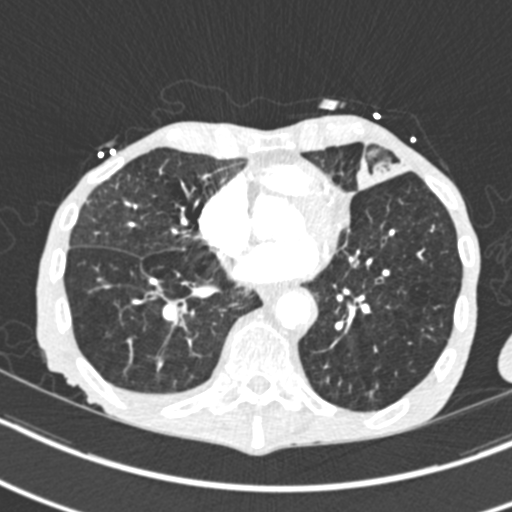
[im 151/339  mediastinal]
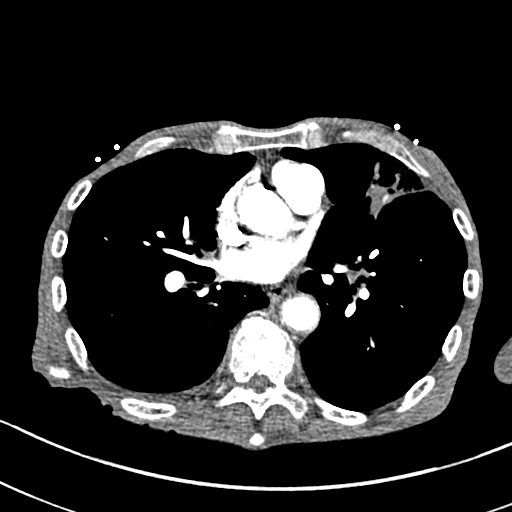
[im 188/339  lung]
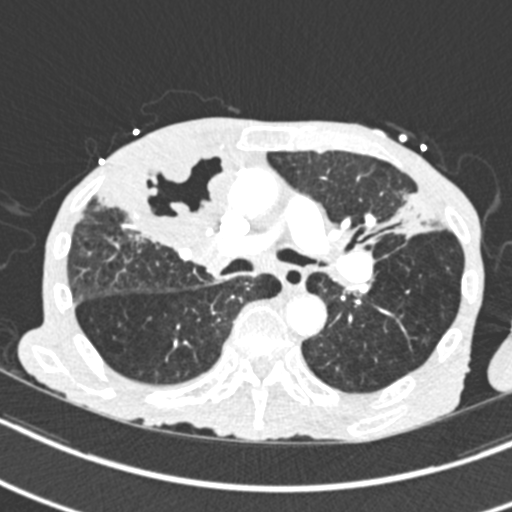
[im 207/339  mediastinal]
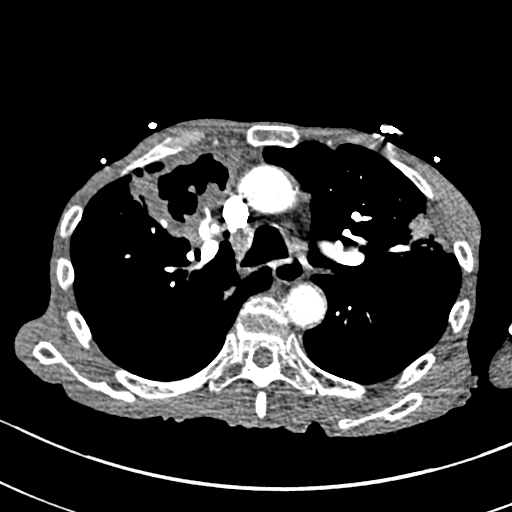
[im 226/339  lung]
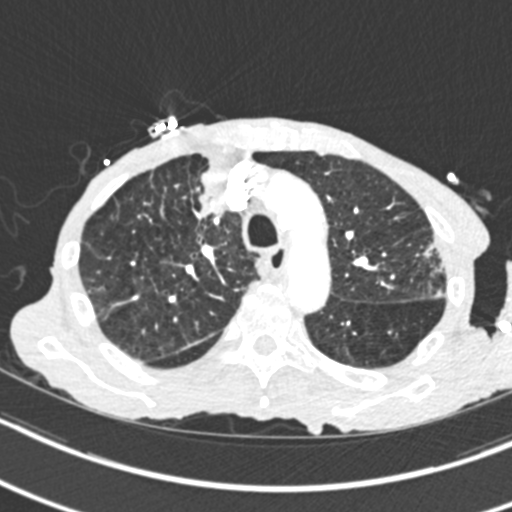
[im 245/339  mediastinal]
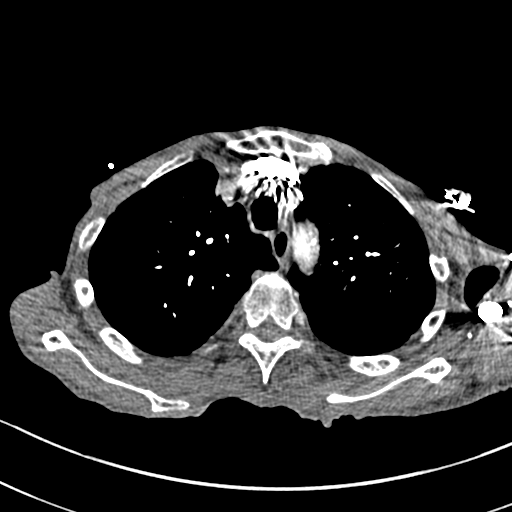
[im 263/339  lung]
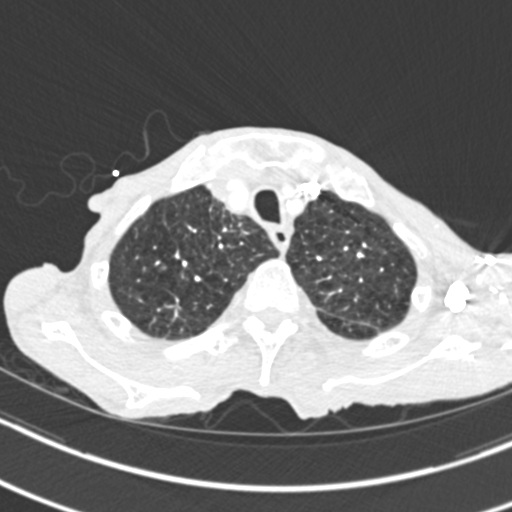
[im 282/339  mediastinal]
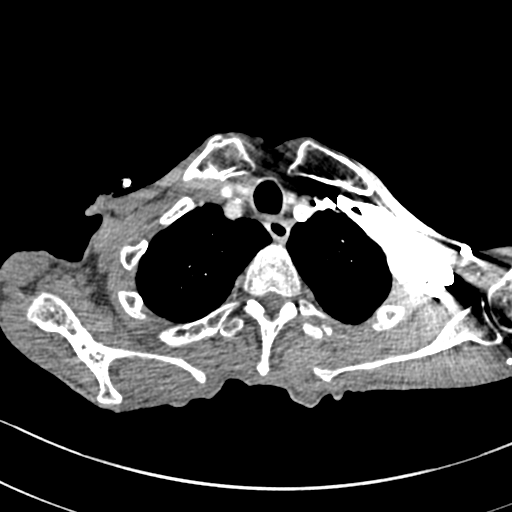
[im 301/339  lung]
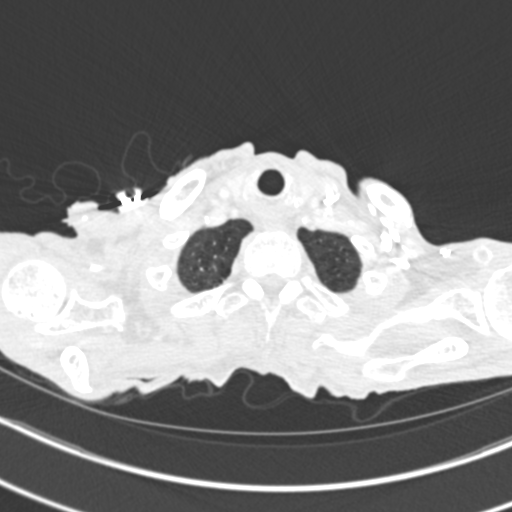
[im 320/339  mediastinal]
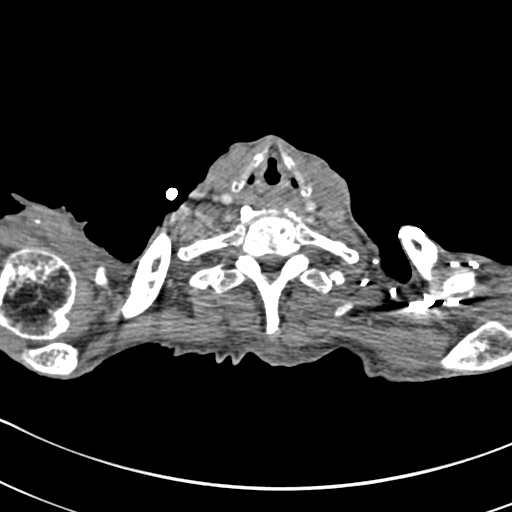

[Series 6: coronal mpr · coronal · 0.63mm/px · 1 of 118 slices shown]
[im 59/118  mediastinal]
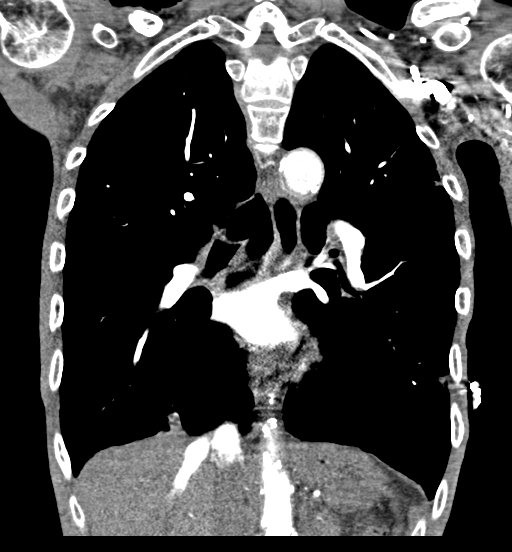

[17 of 36 positions shown; findings below may reference images not displayed]

FINDINGS: Cardiovascular: There is no demonstrable pulmonary embolus. There is
no thoracic aortic aneurysm or dissection. There are scattered foci
of aortic atherosclerosis. There are foci of great vessel
calcification. There are scattered foci of coronary artery
calcification. No pericardial effusion or pericardial thickening.

Mediastinum/Nodes: Thyroid appears unremarkable. There is apparent
adenopathy arising in the right paratracheal region at the site of
consolidation/mass in the right upper lobe. This enlarged lymph node
measures 1.9 x 1.6 cm, slightly smaller than on previous study.
Scattered subcentimeter lymph nodes are also noted in the
mediastinum. No new adenopathy evident. No esophageal lesions are
evident.

Lungs/Pleura: There is underlying centrilobular emphysematous
change. The mass/consolidation with cavitation in the anterior
segment left upper lobe is again noted which extends to the left
paratracheal region and extends to and appears to invade pleura.
This mass/consolidation measures 8.3 x 6.4 x 5.9 cm. There are
nodular areas within the cavitation, likely foci of neoplasm. The
vascularity of this area appear slightly less pronounced than on the
previous study. In addition, there is consolidation with air
bronchograms in the lingula, slightly smaller than on prior study,
currently there is scarring in the lung bases. There is an area of
apparent atelectasis in the anterior segment of the left lower lobe.
There is a stable nodular opacity in the superior segment of the
right lower lobe measuring 6 x 6 mm, seen on axial slice 98 series
10, stable. No new areas of opacity are evident. Bullous changes
noted in the right lower lobe medially. Measuring 3.3 x 2.8 x
cm. This opacity abuts the pleura and possibly may invade the
pleura.

Upper Abdomen: There is reflux of contrast into the inferior vena
cava and hepatic veins. Visualized upper abdominal structures
otherwise appear normal.

Musculoskeletal: Collapse of the T10 vertebral body is again noted
with increase in sclerosis in this vertebral body, concerning for
metastatic focus. No other bone lesions. No chest wall lesions.

Review of the MIP images confirms the above findings.
IMPRESSION: 1. No demonstrable pulmonary embolus. No thoracic aortic aneurysm or
dissection. There is aortic atherosclerosis as well as foci of great
vessel and coronary artery calcification.

2. Cavitary mass in the right upper lobe abuts and may invade the
pleura as well as extends to the right paratracheal region. Nearby
adenopathy in the right paratracheal region is slightly smaller
compared to previous study. Nodular opacities within the cavity
likely represent metastatic foci.

3. Slightly smaller focus of consolidation in the lingula which does
extend to the pleura but does not overtly invade the pleura.
Question neoplastic focus in this area.

4. Underlying centrilobular emphysema. With scattered areas of
scarring and atelectasis.

5. No new adenopathy. Paratracheal lymph node slightly smaller than
on previous study.

6. 6 x 6 mm probable small metastasis in the right lower lobe,
stable.

7. Reflux of contrast into the inferior vena cava and hepatic veins
likely is indicative of increase in right heart pressure.

8. Increased sclerosis at the site of collapsed T10 vertebral body.
Concern for metastatic involvement of this vertebral body.

Aortic Atherosclerosis (KXM5D-KT8.8) and Emphysema (KXM5D-WP5.Q).

## 2020-10-15 ENCOUNTER — Inpatient Hospital Stay: Payer: No Typology Code available for payment source

## 2020-10-15 ENCOUNTER — Inpatient Hospital Stay: Payer: No Typology Code available for payment source | Admitting: Physician Assistant

## 2020-11-04 NOTE — Progress Notes (Signed)
[place] [id number]  DIAGNOSIS: Stage IIICnon-small cell lung cancer, squamous cell carcinoma. He presented with a right upper lobe mass and precardial lymphadenopathy and ipsilateral and contralateral hilar lymphadenopathy. He was diagnosed in April 2021  PRIOR THERAPY: Weekly concurrent chemoradiation with carboplatin for an AUC of 2 and paclitaxel 45 mg per metered squared.Status post6cycles.Taxol discontinued from treatment plan starting from cycle #3 due to adverse reaction.Last dose of chemotherapy 06/11/2020. Last day of radiation 06/14/2020  CURRENT THERAPY: Consolidation immunotherapy with Imfinzi 1500 mg IV every 4 weeks. First dose on 07/19/20. Status post 3 cycles.   INTERVAL HISTORY: George [name] 79 y.o. male returns to the clinic for a follow up visit. The patient is feeling well today without any concerning complaints. The patient continues to tolerate treatment with consolidation immunotherapy with Imfinzi well without any adverse side effects. Denies any fever, chills, or night sweats. His weight is stable since his last appointment. He is followed by the nutritionist team and they are scheduled to see him while in the infusion room today. Denies any chest pain or hemoptysis. He reports his baseline dyspnea on exertion. His cough is improved from when he was first diagnosed and he denies any significant cough at this time. Denies any nausea, vomiting, diarrhea, or constipation. Denies any headache or visual changes. Denies any rashes or skin changes. He denies any new medications. Denies recent viral infections. He denies any abnormal bleeding or bruising. Denies recent alcohol use. He denies any liver disease. The patient is here today for evaluation prior to starting cycle # 4  MEDICAL HISTORY: Past Medical History:  Diagnosis Date  . Cancer [facility]) Prostate  .  Hypertension   . Lung cancer ([place])     ALLERGIES:  is allergic to aspirin.  MEDICATIONS:  Current Outpatient Medications  Medication Sig Dispense Refill  . acetaminophen (TYLENOL) 500 MG tablet Take 500 mg by mouth every 6 (six) hours as needed for moderate pain.    . Iron Combinations (IRON COMPLEX PO) Take 1 tablet by mouth daily.     Marland Kitchen POTASSIUM PO Take 1 tablet by mouth daily.     . prochlorperazine (COMPAZINE) 10 MG tablet Take 1 tablet (10 mg total) by mouth every 6 (six) hours as needed. 30 tablet 2  . sucralfate (CARAFATE) 1 g tablet Take 1 tablet (1 g total) by mouth 4 (four) times daily -  with meals and at bedtime. Crush and dissolve in 15 mL of warm water prior to swallowing 60 tablet 1   No current facility-administered medications for this visit.    SURGICAL HISTORY: No past surgical history on file.  REVIEW OF SYSTEMS:   Review of Systems  Constitutional: Positive for fatigue. Negative for appetite change, chills, fever and unexpected weight change.  HENT: Negative for mouth sores, nosebleeds, sore throat and trouble swallowing.   Eyes: Negative for eye problems and icterus.  Respiratory: Negative for cough, hemoptysis, shortness of breath and wheezing.   Cardiovascular: Negative for chest pain and leg swelling.  Gastrointestinal: Negative for abdominal pain, constipation, diarrhea, nausea and vomiting.  Genitourinary: Negative for bladder incontinence, difficulty urinating, dysuria, frequency and hematuria.   Musculoskeletal: Negative for back pain, gait problem, neck pain and neck stiffness.  Skin: Negative for itching and rash.  Neurological: Negative for dizziness, extremity weakness, gait problem, headaches, light-headedness and seizures.  Hematological: Negative for adenopathy. Does not bruise/bleed easily.  Psychiatric/Behavioral:  Negative for confusion, depression and sleep disturbance. The patient is not nervous/anxious.     PHYSICAL EXAMINATION:  Blood  pressure 126/79, pulse 86, temperature (!) 96.1 F (35.6 C), temperature source Tympanic, resp. rate 18, height 5' 6.5" (1.689 m), weight 92 lb 14.4 oz (42.1 kg), SpO2 96 %.  ECOG PERFORMANCE STATUS: 1 - Symptomatic but completely ambulatory  Physical Exam  Constitutional: Oriented to person, place, and time and cachetic appearing male and in no distress.  HENT:  Head: Normocephalic and atraumatic.  Mouth/Throat: Oropharynx is clear and moist. No oropharyngeal exudate.  Eyes: Conjunctivae are normal. Right eye exhibits no discharge. Left eye exhibits no discharge. No scleral icterus.  Neck: Normal range of motion. Neck supple.  Cardiovascular: Normal rate, regular rhythm, normal heart sounds and intact distal pulses.   Pulmonary/Chest: Effort normal. Quiet breath sounds in all lung fields. No respiratory distress. No wheezes. No rales.  Abdominal: Soft. Bowel sounds are normal. Exhibits no distension and no mass. There is no tenderness.  Musculoskeletal: Normal range of motion. Exhibits no edema.  Lymphadenopathy:    No cervical adenopathy.  Neurological: Alert and oriented to person, place, and time. Exhibits muscle wasting. Examined in the wheelchair.  Skin: Skin is warm and dry. No rash noted. Not diaphoretic. No erythema. No pallor.  Psychiatric: Mood, memory and judgment normal.  Vitals reviewed.  LABORATORY DATA: Lab Results  Component Value Date   WBC 3.3 (L) 11/07/2020   HGB 13.0 11/07/2020   HCT 42.0 11/07/2020   MCV 87.3 11/07/2020   PLT 68 (L) 11/07/2020      Chemistry      Component Value Date/Time   NA 143 10/10/2020 1024   K 3.5 10/10/2020 1024   CL 106 10/10/2020 1024   CO2 28 10/10/2020 1024   BUN 10 10/10/2020 1024   CREATININE 0.80 10/10/2020 1024      Component Value Date/Time   CALCIUM 9.4 10/10/2020 1024   ALKPHOS 73 10/10/2020 1024   AST 11 (L) 10/10/2020 1024   ALT <6 10/10/2020 1024   BILITOT 0.9 10/10/2020 1024       RADIOGRAPHIC  STUDIES:  CT Chest W Contrast  Result Date: 10/09/2020 CLINICAL DATA:  Non-small cell lung cancer of the right upper lobe EXAM: CT CHEST WITH CONTRAST TECHNIQUE: Multidetector CT imaging of the chest was performed during intravenous contrast administration. CONTRAST:  100mL OMNIPAQUE IOHEXOL 300 MG/ML  SOLN COMPARISON:  07/09/2020 FINDINGS: Cardiovascular: Coronary, aortic arch, and branch vessel atherosclerotic vascular disease. Mediastinum/Nodes: Lower paratracheal/precarinal lymph node 0.9 cm in short axis on image 68 of series 2, formerly 1.0 cm by my measurements. Right hilar node 1.3 cm in short axis on image 74 series 2, previously 1.4 cm. Lungs/Pleura: Centrilobular emphysema. Cavitary right upper lobe mass measures about 2.0 by 3.3 cm on image 66 of series 5 and by my measurements was previously 4.2 by 3.7 cm, a moderate reduction some of which is due to reduction in the central cavitary gas-filled portion, but some of which is also due to reduced wall thickening. Surrounding airspace opacity is similar except superiorly where there is some increased airspace opacity distal to the lesion which merits surveillance. Strictly speaking I cannot exclude superior/cephalad paramediastinal tumor extension although that would seem contrary to the otherwise reduced size of the lesion and accordingly this anterior paramediastinal density extending cephalad from the lesion is more likely due to volume loss or inflammation. Stable 6 mm right lower lobe nodule on image 93 of series 5.  Mildly improved band of airspace opacity in the left upper lobe compared to previous. Trace right pleural effusion. Upper Abdomen: Unremarkable Musculoskeletal: Old healed bilateral rib fractures. Old healed transverse sternal fracture. Stable compression fracture at T9 with associated mild sclerosis. IMPRESSION: 1. Moderate reduction in size of the cavitary right upper lobe mass, with some increased airspace opacity distal to the lesion  which merits surveillance. 2. Stable 6 mm right lower lobe nodule. 3. Mildly improved band of airspace opacity in the left upper lobe compared to previous. 4. Minimally reduced paratracheal and right hilar adenopathy. 5. Trace right pleural effusion. 6. Coronary, aortic arch, and branch vessel atherosclerotic vascular disease. 7. Stable compression fracture at T9 with associated mild sclerosis. 8. Old healed bilateral rib fractures and old healed transverse sternal fracture. 9. Emphysema and aortic atherosclerosis. Aortic Atherosclerosis (ICD10-I70.0) and Emphysema (ICD10-J43.9). Electronically Signed   By: Van Clines M.D.   On: 10/09/2020 14:08     ASSESSMENT/PLAN:  This is a very pleasant 79 year old African American male diagnosed with stage IIICnon-small cell lung cancer, squamous cell carcinoma. He presented with a right upper lobe mass precardial lymphadenopathy and ipsilateral and contralateral hilar lymphadenopathy. He also has a faint area of hypermetabolic activityleft femoral shaftthat Dr. Sondra Come did not feel was suspicious for metastasis. He was diagnosed in April 2021.  The patient completed 6 cycles of weekly concurrent chemoradiation with carboplatin for an AUC of 2 and paclitaxel 45 mg/m. His last dose of chemotherapy was on 06/11/2020. The patient had a reaction to Taxol and this was removed from his treatment plan starting from cycle #3.  The patient is currently undergoing consolidation immunotherapy with Imfinzi 1500 mg IV every 4 weeks.  The patient is status post 3 cycles and he has been tolerating it well without any adverse side effects.  Labs were reviewed. 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 Reviewed labs with Dr. Julien Nordmann. Recommend he proceed today with cycle #4.   We will see him back for a follow up visit in 4 weeks for evaluation before starting cycle #5.   The patient was advised to call immediately if he has any concerning  symptoms in the interval. The patient voices understanding of current disease status and treatment options and is in agreement with the current care plan. All questions were answered. The patient knows to call the clinic with any problems, questions or concerns. We can certainly see the patient much sooner if necessary    No orders of the defined types were placed in this encounter.    George Archibeque L Lynne Takemoto, PA-C 11/07/20

## 2020-11-07 ENCOUNTER — Inpatient Hospital Stay (HOSPITAL_BASED_OUTPATIENT_CLINIC_OR_DEPARTMENT_OTHER): Payer: No Typology Code available for payment source | Admitting: Physician Assistant

## 2020-11-07 ENCOUNTER — Inpatient Hospital Stay: Payer: No Typology Code available for payment source | Attending: Radiation Oncology

## 2020-11-07 ENCOUNTER — Inpatient Hospital Stay: Payer: No Typology Code available for payment source

## 2020-11-07 ENCOUNTER — Other Ambulatory Visit: Payer: Self-pay

## 2020-11-07 ENCOUNTER — Inpatient Hospital Stay: Payer: No Typology Code available for payment source | Admitting: Nutrition

## 2020-11-07 VITALS — BP 126/79 | HR 86 | Temp 96.1°F | Resp 18 | Ht 66.5 in | Wt 92.9 lb

## 2020-11-07 DIAGNOSIS — C3411 Malignant neoplasm of upper lobe, right bronchus or lung: Secondary | ICD-10-CM

## 2020-11-07 DIAGNOSIS — Z923 Personal history of irradiation: Secondary | ICD-10-CM | POA: Insufficient documentation

## 2020-11-07 DIAGNOSIS — D696 Thrombocytopenia, unspecified: Secondary | ICD-10-CM | POA: Diagnosis not present

## 2020-11-07 DIAGNOSIS — Z9221 Personal history of antineoplastic chemotherapy: Secondary | ICD-10-CM | POA: Insufficient documentation

## 2020-11-07 DIAGNOSIS — Z5112 Encounter for antineoplastic immunotherapy: Secondary | ICD-10-CM | POA: Insufficient documentation

## 2020-11-07 DIAGNOSIS — Z79899 Other long term (current) drug therapy: Secondary | ICD-10-CM | POA: Insufficient documentation

## 2020-11-07 LAB — CBC WITH DIFFERENTIAL (CANCER CENTER ONLY)
Abs Immature Granulocytes: 0.01 10*3/uL (ref 0.00–0.07)
Basophils Absolute: 0 10*3/uL (ref 0.0–0.1)
Basophils Relative: 0 %
Eosinophils Absolute: 0 10*3/uL (ref 0.0–0.5)
Eosinophils Relative: 0 %
HCT: 42 % (ref 39.0–52.0)
Hemoglobin: 13 g/dL (ref 13.0–17.0)
Immature Granulocytes: 0 %
Lymphocytes Relative: 18 %
Lymphs Abs: 0.6 10*3/uL — ABNORMAL LOW (ref 0.7–4.0)
MCH: 27 pg (ref 26.0–34.0)
MCHC: 31 g/dL (ref 30.0–36.0)
MCV: 87.3 fL (ref 80.0–100.0)
Monocytes Absolute: 0.5 10*3/uL (ref 0.1–1.0)
Monocytes Relative: 15 %
Neutro Abs: 2.2 10*3/uL (ref 1.7–7.7)
Neutrophils Relative %: 67 %
Platelet Count: 68 10*3/uL — ABNORMAL LOW (ref 150–400)
RBC: 4.81 MIL/uL (ref 4.22–5.81)
RDW: 15.3 % (ref 11.5–15.5)
WBC Count: 3.3 10*3/uL — ABNORMAL LOW (ref 4.0–10.5)
nRBC: 0 % (ref 0.0–0.2)

## 2020-11-07 LAB — CMP (CANCER CENTER ONLY)
ALT: 6 U/L (ref 0–44)
AST: 13 U/L — ABNORMAL LOW (ref 15–41)
Albumin: 3.3 g/dL — ABNORMAL LOW (ref 3.5–5.0)
Alkaline Phosphatase: 74 U/L (ref 38–126)
Anion gap: 9 (ref 5–15)
BUN: 12 mg/dL (ref 8–23)
CO2: 25 mmol/L (ref 22–32)
Calcium: 9.4 mg/dL (ref 8.9–10.3)
Chloride: 106 mmol/L (ref 98–111)
Creatinine: 0.83 mg/dL (ref 0.61–1.24)
GFR, Estimated: >60 mL/min (ref >60)
Glucose, Bld: 88 mg/dL (ref 70–99)
Potassium: 3.8 mmol/L (ref 3.5–5.1)
Sodium: 140 mmol/L (ref 135–145)
Total Bilirubin: 0.5 mg/dL (ref 0.3–1.2)
Total Protein: 7.6 g/dL (ref 6.5–8.1)

## 2020-11-07 LAB — TSH: TSH: 0.083 u[IU]/mL — ABNORMAL LOW (ref 0.320–4.118)

## 2020-11-07 MED ORDER — SODIUM CHLORIDE 0.9 % IV SOLN
Freq: Once | INTRAVENOUS | Status: AC
  Filled 2020-11-07: qty 250

## 2020-11-07 MED ORDER — SODIUM CHLORIDE 0.9 % IV SOLN
1500.0000 mg | Freq: Once | INTRAVENOUS | Status: AC
  Administered 2020-11-07: 1500 mg via INTRAVENOUS
  Filled 2020-11-07: qty 30

## 2020-11-07 NOTE — Progress Notes (Signed)
Nutrition follow-up completed with patient during infusion for non-small cell lung cancer. Patient is receiving immunotherapy which started August 13. Weight has stabilized and was documented as 92.9 pounds December 2. Patient denies nutrition impact symptoms. He is drinking 3 cartons of Ensure Plus daily. He has no questions. He would appreciate an additional case of Ensure Plus.  Nutrition diagnosis: Unintended weight loss is stable.  Intervention: Educated patient to continue strategies for increased calories and protein throughout the day. Continue Ensure Plus 3 times a day. Provided additional complementary case of Ensure Plus. Teach back method used.  Monitoring, evaluation, goals: Patient will tolerate adequate calories and protein to promote weight stabilization/weight gain.  Next visit: Thursday, December 30.  **Disclaimer: This note was dictated with voice recognition software. Similar sounding words can inadvertently be transcribed and this note may contain transcription errors which may not have been corrected upon publication of note.**

## 2020-11-07 NOTE — Progress Notes (Signed)
Per Cassie Heilingoetter, PA, ok to treat with platelets 68.  Patient stable upon discharge from infusion room.

## 2020-11-07 NOTE — Patient Instructions (Signed)
[facility] Discharge Instructions for Patients Receiving Chemotherapy  Today you received the following chemotherapy agents: Imfinzi.  To help prevent nausea and vomiting after your treatment, we encourage you to take your nausea medication as directed.   If you develop nausea and vomiting that is not controlled by your nausea medication, call the clinic.   BELOW ARE SYMPTOMS THAT SHOULD BE REPORTED IMMEDIATELY:  *FEVER GREATER THAN 100.5 F  *CHILLS WITH OR WITHOUT FEVER  NAUSEA AND VOMITING THAT IS NOT CONTROLLED WITH YOUR NAUSEA MEDICATION  *UNUSUAL SHORTNESS OF BREATH  *UNUSUAL BRUISING OR BLEEDING  TENDERNESS IN MOUTH AND THROAT WITH OR WITHOUT PRESENCE OF ULCERS  *URINARY PROBLEMS  *BOWEL PROBLEMS  UNUSUAL RASH Items with * indicate a potential emergency and should be followed up as soon as possible.  Feel free to call the clinic should you have any questions or concerns. The clinic phone number is [phone].  Please show the CHEMO ALERT CARD at check-in to the Emergency Department and triage nurse.   

## 2020-11-12 ENCOUNTER — Ambulatory Visit: Payer: No Typology Code available for payment source

## 2020-11-12 ENCOUNTER — Other Ambulatory Visit: Payer: No Typology Code available for payment source

## 2020-11-12 ENCOUNTER — Encounter: Payer: No Typology Code available for payment source | Admitting: Nutrition

## 2020-11-12 ENCOUNTER — Ambulatory Visit: Payer: No Typology Code available for payment source | Admitting: Internal Medicine

## 2020-12-05 ENCOUNTER — Encounter: Payer: No Typology Code available for payment source | Admitting: Nutrition

## 2020-12-05 ENCOUNTER — Inpatient Hospital Stay: Payer: No Typology Code available for payment source

## 2020-12-05 ENCOUNTER — Inpatient Hospital Stay: Payer: No Typology Code available for payment source | Admitting: Internal Medicine

## 2020-12-05 ENCOUNTER — Telehealth: Payer: Self-pay | Admitting: *Deleted

## 2020-12-05 NOTE — Telephone Encounter (Signed)
TCT patient as he did not show up for his labs, MD appt and treatment.  Spoke with pt. He states that he was exposed to someone with Covid and is going to get tested today.  Asked pt to call us with his results. Appts cancelled for today

## 2020-12-09 ENCOUNTER — Ambulatory Visit: Payer: No Typology Code available for payment source | Admitting: Physician Assistant

## 2020-12-09 ENCOUNTER — Other Ambulatory Visit: Payer: No Typology Code available for payment source

## 2020-12-09 ENCOUNTER — Ambulatory Visit: Payer: No Typology Code available for payment source

## 2020-12-10 NOTE — Progress Notes (Unsigned)
George [name] [id number]  DIAGNOSIS: Stage IIICnon-small cell lung cancer, squamous cell carcinoma. He presented with a right upper lobe mass and precardial lymphadenopathy and ipsilateral and contralateral hilar lymphadenopathy. He was diagnosed in April 2021  PRIOR THERAPY: Weekly concurrent chemoradiation with carboplatin for an AUC of 2 and paclitaxel 45 mg per metered squared.Status post6cycles.Taxol discontinued from treatment plan starting from cycle #3 due to adverse reaction.Last dose of chemotherapy 06/11/2020. Last day of radiation 06/14/2020  CURRENT THERAPY: Consolidation immunotherapy with Imfinzi 1500 mg IV every 4 weeks. First dose on 07/19/20. Status post 4 cycles.  INTERVAL HISTORY: George [name] 80 y.o. male returns to the clinic for a follow up visit. The patient is feeling well today without any concerning complaints. The patient missed his appointment last week due to a COVID-19 exposure. He was tested which was negative. Denies any loss of taste/smell, new cough, sore throat, unusual fatigue, body aches, or unusual shortness of breath. The patient continues to tolerate treatment with consolidation immunotherapy with Imfinzi well without any adverse sideeffects. Denies any fever, chills, or night sweats. He gained a pound since his last appointment. He is followed by the nutritionist team. He had an appointment with them at his last appointment and received complementary supplemental drinks. Denies any chest pain or hemoptysis. He reports his baseline dyspnea on exertion. He reports his baseline cough. He has robitussin if needed. Denies any nausea, vomiting, diarrhea, or constipation. Denies any headaches. Denies any rashes or skin changes.He denies any new medications. Denies recent viral infections. He denies any abnormal bleeding or bruising.Denies recent alcohol  use.He denies any liver disease.The patient is here today for evaluation prior to starting cycle #5  MEDICAL HISTORY: Past Medical History:  Diagnosis Date  . Cancer [facility]) Prostate  . Hypertension   . Lung cancer ([place])     ALLERGIES:  is allergic to aspirin.  MEDICATIONS:  Current Outpatient Medications  Medication Sig Dispense Refill  . acetaminophen (TYLENOL) 500 MG tablet Take 500 mg by mouth every 6 (six) hours as needed for moderate pain.    . Iron Combinations (IRON COMPLEX PO) Take 1 tablet by mouth daily.     Marland Kitchen POTASSIUM PO Take 1 tablet by mouth daily.     . prochlorperazine (COMPAZINE) 10 MG tablet Take 1 tablet (10 mg total) by mouth every 6 (six) hours as needed. 30 tablet 2  . sucralfate (CARAFATE) 1 g tablet Take 1 tablet (1 g total) by mouth 4 (four) times daily -  with meals and at bedtime. Crush and dissolve in 15 mL of warm water prior to swallowing 60 tablet 1   No current facility-administered medications for this visit.    SURGICAL HISTORY: No past surgical history on file.  REVIEW OF SYSTEMS:   Review of Systems  Constitutional: Positive for fatigue. Negative for appetite change, chills, fever and unexpected weight change.  HENT: Negative for mouth sores, nosebleeds, sore throat and trouble swallowing.  Eyes: Negative for eye problems and icterus.  Respiratory: Positive for mild baseline cough and shortness of breath. Negative for hemoptysis and wheezing.  Cardiovascular: Negative for chest pain and leg swelling.  Gastrointestinal: Negative for abdominal pain, constipation, diarrhea, nausea and vomiting.  Genitourinary: Negative for bladder incontinence, difficulty urinating, dysuria, frequency and hematuria.  Musculoskeletal: Negative for back pain, gait problem, neck pain and neck stiffness.  Skin: Negative for itching and rash.  Neurological: Negative  for dizziness, extremity weakness, gait problem, headaches, light-headedness and seizures.   Hematological: Negative for adenopathy. Does not bruise/bleed easily.  Psychiatric/Behavioral: Negative for confusion, depression and sleep disturbance. The patient is not nervous/anxious.    PHYSICAL EXAMINATION:  Blood pressure 121/74, pulse 84, temperature 97.8 F (36.6 C), temperature source Tympanic, resp. rate 20, height 5\' 6"  (1.676 m), weight 94 lb (42.6 kg), SpO2 98 %.  ECOG PERFORMANCE STATUS: 1 - Symptomatic but completely ambulatory  Physical Exam  Constitutional: Oriented to person, place, and time andcachetic appearing maleand in no distress.  HENT:  Head: Normocephalic and atraumatic.  Mouth/Throat: Oropharynx is clear and moist. No oropharyngeal exudate.  Eyes: Conjunctivae are normal. Right eye exhibits no discharge. Left eye exhibits no discharge. No scleral icterus.  Neck: Normal range of motion. Neck supple.  Cardiovascular: Normal rate, regular rhythm, normal heart sounds and intact distal pulses.  Pulmonary/Chest: Effort normal.Quiet breath sounds in all lung fields.No respiratory distress. No wheezes. No rales.  Abdominal: Soft. Bowel sounds are normal. Exhibits no distension and no mass. There is no tenderness.  Musculoskeletal: Normal range of motion. Exhibits no edema.  Lymphadenopathy:  No cervical adenopathy.  Neurological: Alert and oriented to person, place, and time. Exhibits muscle wasting. Examined in the wheelchair. Skin: Skin is warm and dry. No rash noted. Not diaphoretic. No erythema. No pallor.  Psychiatric: Mood, memory and judgment normal.  Vitals reviewed.  LABORATORY DATA: Lab Results  Component Value Date   WBC 2.6 (L) 12/13/2020   HGB 14.5 12/13/2020   HCT 45.4 12/13/2020   MCV 84.9 12/13/2020   PLT 50 (L) 12/13/2020      Chemistry      Component Value Date/Time   NA 138 12/13/2020 1335   K 5.2 (H) 12/13/2020 1335   CL 102 12/13/2020 1335   CO2 26 12/13/2020 1335   BUN 12 12/13/2020 1335   CREATININE 1.02 12/13/2020  1335      Component Value Date/Time   CALCIUM 9.9 12/13/2020 1335   ALKPHOS 68 12/13/2020 1335   AST 14 (L) 12/13/2020 1335   ALT 7 12/13/2020 1335   BILITOT 0.6 12/13/2020 1335       RADIOGRAPHIC STUDIES:  No results found.   ASSESSMENT/PLAN:  This is a very pleasant 80 year old African American male diagnosed with stage IIICnon-small cell lung cancer, squamous cell carcinoma. He presented with a right upper lobe mass precardial lymphadenopathy and ipsilateral and contralateral hilar lymphadenopathy. He also has a faint area of hypermetabolic activityleft femoral shaftthat Dr. Sondra Come did not feel was suspicious for metastasis. He was diagnosed in April 2021.  The patient completed 6 cycles of weekly concurrent chemoradiation with carboplatin for an AUC of 2 and paclitaxel 45 mg/m. His last dose of chemotherapy was on 06/11/2020. The patient had a reaction to Taxol and this was removed from his treatment plan starting from cycle #3.  The patient is currently undergoing consolidation immunotherapy with Imfinzi 1500 mg IV every 4 weeks. The patient is status post 4 cycles and he has been tolerating it well without any adverse side effects.  Labs were reviewed. He continues to have persistent thrombocytopenia. Recommend he proceed today with cycle #5. He is ok to treat with labs from today (12/13/20).  His last scan was 10/09/20. His scanning schedule is a little off what we typically do due to missing appointments. Since today will be 3 cycles since his last scan, we will order a restaging CT scan of the chest prior  to starting his next cycle of treatment.   We will see him back for a follow up visit in 4 weeks for evaluation and to review his scan results before starting cycle #6.   The patient was advised to call immediately if he has any concerning symptoms in the interval. The patient voices understanding of current disease status and treatment options and is in agreement  with the current care plan. All questions were answered. The patient knows to call the clinic with any problems, questions or concerns. We can certainly see the patient much sooner if necessary    I spent a total time of 20-29 minutes for this encounter.     No orders of the defined types were placed in this encounter.    Aleina Burgio L Liyla Radliff, PA-C 12/13/20

## 2020-12-11 ENCOUNTER — Telehealth: Payer: Self-pay

## 2020-12-11 NOTE — Telephone Encounter (Signed)
I called pts niece, Gabriel Cirri, and advised her pt has these appts on Friday with an arrival time of 1:15pm. She expressed understanding of this information.

## 2020-12-11 NOTE — Telephone Encounter (Signed)
-----   Message from [facility] sent at 12/10/2020  3:29 PM EST ----- Regarding: FW: add on I have this patient scheduled for 1/7 this Friday - can you please try to reach pt . I tried and was not able to get pt or leave message ----- Message ----- From: Heilingoetter, Tobe Sos, PA-C Sent: 12/10/2020   1:36 PM EST To: Regan Rakers, RN, Nicholaus Corolla, # Subject: RE: add on                                     Yes that's ok for this patient. ----- Message ----- From: Nicholaus Corolla Sent: 12/10/2020   1:16 PM EST To: Regan Rakers, RN, Nicholaus Corolla, # Subject: RE: add on                                     Cassie , can this mohamed pt be seen on Friday or will we have to decouple ?  ----- Message ----- From: Gardiner Rhyme, RN Sent: 12/10/2020  12:19 PM EST To: Regan Rakers, RN, Nicholaus Corolla, # Subject: RE: add on                                     Are we scheduling or not?  Please let me know.  Melanie ----- Message ----- From: Regan Rakers, RN Sent: 12/09/2020  10:17 AM EST To: Nicholaus Corolla, [facility] Subject: RE: add on                                     May have to decouple, we have room on Friday 1/7 at 2:30, but I dont see any time before that this week.    ----- Message ----- From: Nicholaus Corolla Sent: 12/05/2020   9:20 AM EST To: Nicholaus Corolla, [facility] Subject: add on                                          Scheduling Message Entered by Guy Sandifer on 12/05/2020 at 9:11 AM Priority: High <No visit type provided> Department: [facility] Provider: [id number] Scheduling Notes: Reschedule appts on 12/05/20   Lab /md and infusion - is there a day next wk we can add in infusion - 2 hrs - .   MD has openings in the afternoon on 1/4,1/5 and 1/6 if it needs to be late afternoon

## 2020-12-13 ENCOUNTER — Encounter: Payer: Self-pay | Admitting: Physician Assistant

## 2020-12-13 ENCOUNTER — Inpatient Hospital Stay: Payer: No Typology Code available for payment source | Attending: Radiation Oncology

## 2020-12-13 ENCOUNTER — Inpatient Hospital Stay (HOSPITAL_BASED_OUTPATIENT_CLINIC_OR_DEPARTMENT_OTHER): Payer: No Typology Code available for payment source | Admitting: Physician Assistant

## 2020-12-13 ENCOUNTER — Inpatient Hospital Stay: Payer: No Typology Code available for payment source

## 2020-12-13 ENCOUNTER — Other Ambulatory Visit: Payer: Self-pay | Admitting: Physician Assistant

## 2020-12-13 ENCOUNTER — Other Ambulatory Visit: Payer: Self-pay

## 2020-12-13 VITALS — BP 121/74 | HR 84 | Temp 97.8°F | Resp 20 | Ht 66.0 in | Wt 94.0 lb

## 2020-12-13 DIAGNOSIS — Z5112 Encounter for antineoplastic immunotherapy: Secondary | ICD-10-CM | POA: Insufficient documentation

## 2020-12-13 DIAGNOSIS — C3411 Malignant neoplasm of upper lobe, right bronchus or lung: Secondary | ICD-10-CM | POA: Insufficient documentation

## 2020-12-13 DIAGNOSIS — Z9221 Personal history of antineoplastic chemotherapy: Secondary | ICD-10-CM | POA: Insufficient documentation

## 2020-12-13 DIAGNOSIS — D696 Thrombocytopenia, unspecified: Secondary | ICD-10-CM | POA: Diagnosis not present

## 2020-12-13 DIAGNOSIS — E039 Hypothyroidism, unspecified: Secondary | ICD-10-CM

## 2020-12-13 DIAGNOSIS — Z923 Personal history of irradiation: Secondary | ICD-10-CM | POA: Insufficient documentation

## 2020-12-13 DIAGNOSIS — Z79899 Other long term (current) drug therapy: Secondary | ICD-10-CM | POA: Insufficient documentation

## 2020-12-13 LAB — CMP (CANCER CENTER ONLY)
ALT: 7 U/L (ref 0–44)
AST: 14 U/L — ABNORMAL LOW (ref 15–41)
Albumin: 3.3 g/dL — ABNORMAL LOW (ref 3.5–5.0)
Alkaline Phosphatase: 68 U/L (ref 38–126)
Anion gap: 10 (ref 5–15)
BUN: 12 mg/dL (ref 8–23)
CO2: 26 mmol/L (ref 22–32)
Calcium: 9.9 mg/dL (ref 8.9–10.3)
Chloride: 102 mmol/L (ref 98–111)
Creatinine: 1.02 mg/dL (ref 0.61–1.24)
GFR, Estimated: >60 mL/min (ref >60)
Glucose, Bld: 151 mg/dL — ABNORMAL HIGH (ref 70–99)
Potassium: 5.2 mmol/L — ABNORMAL HIGH (ref 3.5–5.1)
Sodium: 138 mmol/L (ref 135–145)
Total Bilirubin: 0.6 mg/dL (ref 0.3–1.2)
Total Protein: 8.3 g/dL — ABNORMAL HIGH (ref 6.5–8.1)

## 2020-12-13 LAB — CBC WITH DIFFERENTIAL (CANCER CENTER ONLY)
Abs Immature Granulocytes: 0 K/uL (ref 0.00–0.07)
Basophils Absolute: 0 K/uL (ref 0.0–0.1)
Basophils Relative: 0 %
Eosinophils Absolute: 0 K/uL (ref 0.0–0.5)
Eosinophils Relative: 0 %
HCT: 45.4 % (ref 39.0–52.0)
Hemoglobin: 14.5 g/dL (ref 13.0–17.0)
Immature Granulocytes: 0 %
Lymphocytes Relative: 22 %
Lymphs Abs: 0.6 K/uL — ABNORMAL LOW (ref 0.7–4.0)
MCH: 27.1 pg (ref 26.0–34.0)
MCHC: 31.9 g/dL (ref 30.0–36.0)
MCV: 84.9 fL (ref 80.0–100.0)
Monocytes Absolute: 0.3 K/uL (ref 0.1–1.0)
Monocytes Relative: 10 %
Neutro Abs: 1.8 K/uL (ref 1.7–7.7)
Neutrophils Relative %: 68 %
Platelet Count: 50 K/uL — ABNORMAL LOW (ref 150–400)
RBC: 5.35 MIL/uL (ref 4.22–5.81)
RDW: 15.4 % (ref 11.5–15.5)
WBC Count: 2.6 K/uL — ABNORMAL LOW (ref 4.0–10.5)
nRBC: 0 % (ref 0.0–0.2)

## 2020-12-13 LAB — TSH: TSH: 30.278 u[IU]/mL — ABNORMAL HIGH (ref 0.320–4.118)

## 2020-12-13 MED ORDER — LEVOTHYROXINE SODIUM 50 MCG PO TABS: 50.0000 ug | ORAL_TABLET | Freq: Every day | ORAL | 1 refills | Status: DC

## 2020-12-13 MED ORDER — DURVALUMAB 500 MG/10ML IV SOLN
1500.0000 mg | Freq: Once | INTRAVENOUS | Status: AC
Start: 2020-12-13 — End: 2020-12-13
  Administered 2020-12-13: 1500 mg via INTRAVENOUS
  Filled 2020-12-13: qty 30

## 2020-12-13 MED ORDER — SODIUM CHLORIDE 0.9 % IV SOLN
Freq: Once | INTRAVENOUS | Status: AC
  Filled 2020-12-13: qty 250

## 2020-12-13 NOTE — Patient Instructions (Signed)
[facility] Discharge Instructions for Patients Receiving Chemotherapy  Today you received the following chemotherapy agents: Imfinzi.  To help prevent nausea and vomiting after your treatment, we encourage you to take your nausea medication as directed.   If you develop nausea and vomiting that is not controlled by your nausea medication, call the clinic.   BELOW ARE SYMPTOMS THAT SHOULD BE REPORTED IMMEDIATELY:  *FEVER GREATER THAN 100.5 F  *CHILLS WITH OR WITHOUT FEVER  NAUSEA AND VOMITING THAT IS NOT CONTROLLED WITH YOUR NAUSEA MEDICATION  *UNUSUAL SHORTNESS OF BREATH  *UNUSUAL BRUISING OR BLEEDING  TENDERNESS IN MOUTH AND THROAT WITH OR WITHOUT PRESENCE OF ULCERS  *URINARY PROBLEMS  *BOWEL PROBLEMS  UNUSUAL RASH Items with * indicate a potential emergency and should be followed up as soon as possible.  Feel free to call the clinic should you have any questions or concerns. The clinic phone number is [phone].  Please show the CHEMO ALERT CARD at check-in to the Emergency Department and triage nurse.   

## 2020-12-13 NOTE — Progress Notes (Unsigned)
Per Cassie Heilingoetter, PA, pt is okay to proceed with platelet value of 50.

## 2020-12-27 ENCOUNTER — Other Ambulatory Visit: Payer: Self-pay | Admitting: Physician Assistant

## 2021-01-02 ENCOUNTER — Ambulatory Visit: Payer: No Typology Code available for payment source | Admitting: Physician Assistant

## 2021-01-02 ENCOUNTER — Other Ambulatory Visit: Payer: No Typology Code available for payment source

## 2021-01-02 ENCOUNTER — Encounter: Payer: No Typology Code available for payment source | Admitting: Nutrition

## 2021-01-02 ENCOUNTER — Ambulatory Visit: Payer: No Typology Code available for payment source

## 2021-01-06 ENCOUNTER — Other Ambulatory Visit: Payer: Self-pay

## 2021-01-06 ENCOUNTER — Ambulatory Visit (HOSPITAL_COMMUNITY)
Admission: RE | Admit: 2021-01-06 | Discharge: 2021-01-06 | Disposition: A | Discharge status: Home / Self Care | Payer: No Typology Code available for payment source | Source: Ambulatory Visit | Attending: Physician Assistant | Admitting: Physician Assistant

## 2021-01-06 ENCOUNTER — Encounter (HOSPITAL_COMMUNITY): Payer: Self-pay

## 2021-01-06 DIAGNOSIS — C3411 Malignant neoplasm of upper lobe, right bronchus or lung: Secondary | ICD-10-CM | POA: Diagnosis not present

## 2021-01-06 MED ORDER — IOHEXOL 300 MG/ML  SOLN
75.0000 mL | Freq: Once | INTRAMUSCULAR | Status: AC | PRN
  Administered 2021-01-06: 75 mL via INTRAVENOUS

## 2021-01-09 ENCOUNTER — Telehealth: Payer: Self-pay | Admitting: Physician Assistant

## 2021-01-09 ENCOUNTER — Inpatient Hospital Stay: Payer: No Typology Code available for payment source

## 2021-01-09 ENCOUNTER — Telehealth: Payer: Self-pay | Admitting: Medical Oncology

## 2021-01-09 ENCOUNTER — Encounter: Payer: Self-pay | Admitting: Internal Medicine

## 2021-01-09 ENCOUNTER — Inpatient Hospital Stay: Payer: No Typology Code available for payment source | Attending: Radiation Oncology | Admitting: Internal Medicine

## 2021-01-09 ENCOUNTER — Other Ambulatory Visit: Payer: Self-pay

## 2021-01-09 VITALS — BP 115/74 | HR 87 | Temp 98.2°F | Resp 18 | Ht 66.0 in | Wt 95.6 lb

## 2021-01-09 DIAGNOSIS — D696 Thrombocytopenia, unspecified: Secondary | ICD-10-CM | POA: Insufficient documentation

## 2021-01-09 DIAGNOSIS — C3411 Malignant neoplasm of upper lobe, right bronchus or lung: Secondary | ICD-10-CM | POA: Diagnosis not present

## 2021-01-09 DIAGNOSIS — Z79899 Other long term (current) drug therapy: Secondary | ICD-10-CM | POA: Insufficient documentation

## 2021-01-09 DIAGNOSIS — I10 Essential (primary) hypertension: Secondary | ICD-10-CM | POA: Diagnosis not present

## 2021-01-09 DIAGNOSIS — Z5111 Encounter for antineoplastic chemotherapy: Secondary | ICD-10-CM | POA: Diagnosis not present

## 2021-01-09 DIAGNOSIS — Z5112 Encounter for antineoplastic immunotherapy: Secondary | ICD-10-CM | POA: Diagnosis not present

## 2021-01-09 DIAGNOSIS — E039 Hypothyroidism, unspecified: Secondary | ICD-10-CM | POA: Insufficient documentation

## 2021-01-09 DIAGNOSIS — Z923 Personal history of irradiation: Secondary | ICD-10-CM | POA: Diagnosis not present

## 2021-01-09 LAB — CMP (CANCER CENTER ONLY)
ALT: 22 U/L (ref 0–44)
AST: 29 U/L (ref 15–41)
Albumin: 3.2 g/dL — ABNORMAL LOW (ref 3.5–5.0)
Alkaline Phosphatase: 63 U/L (ref 38–126)
Anion gap: 10 (ref 5–15)
BUN: 13 mg/dL (ref 8–23)
CO2: 26 mmol/L (ref 22–32)
Calcium: 8.9 mg/dL (ref 8.9–10.3)
Chloride: 105 mmol/L (ref 98–111)
Creatinine: 1.19 mg/dL (ref 0.61–1.24)
GFR, Estimated: >60 mL/min (ref >60)
Glucose, Bld: 94 mg/dL (ref 70–99)
Potassium: 4.1 mmol/L (ref 3.5–5.1)
Sodium: 141 mmol/L (ref 135–145)
Total Bilirubin: 0.5 mg/dL (ref 0.3–1.2)
Total Protein: 7.7 g/dL (ref 6.5–8.1)

## 2021-01-09 LAB — CBC WITH DIFFERENTIAL (CANCER CENTER ONLY)
Abs Immature Granulocytes: 0.01 10*3/uL (ref 0.00–0.07)
Basophils Absolute: 0 10*3/uL (ref 0.0–0.1)
Basophils Relative: 0 %
Eosinophils Absolute: 0 10*3/uL (ref 0.0–0.5)
Eosinophils Relative: 0 %
HCT: 41.3 % (ref 39.0–52.0)
Hemoglobin: 12.9 g/dL — ABNORMAL LOW (ref 13.0–17.0)
Immature Granulocytes: 0 %
Lymphocytes Relative: 22 %
Lymphs Abs: 0.8 10*3/uL (ref 0.7–4.0)
MCH: 26.6 pg (ref 26.0–34.0)
MCHC: 31.2 g/dL (ref 30.0–36.0)
MCV: 85.2 fL (ref 80.0–100.0)
Monocytes Absolute: 0.4 10*3/uL (ref 0.1–1.0)
Monocytes Relative: 10 %
Neutro Abs: 2.3 10*3/uL (ref 1.7–7.7)
Neutrophils Relative %: 68 %
Platelet Count: 89 10*3/uL — ABNORMAL LOW (ref 150–400)
RBC: 4.85 MIL/uL (ref 4.22–5.81)
RDW: 16.8 % — ABNORMAL HIGH (ref 11.5–15.5)
WBC Count: 3.4 10*3/uL — ABNORMAL LOW (ref 4.0–10.5)
nRBC: 0 % (ref 0.0–0.2)

## 2021-01-09 LAB — TSH: TSH: 43.008 u[IU]/mL — ABNORMAL HIGH (ref 0.320–4.118)

## 2021-01-09 MED ORDER — LEVOTHYROXINE SODIUM 50 MCG PO TABS
50.0000 ug | ORAL_TABLET | Freq: Every day | ORAL | 1 refills | Status: DC
Start: 2021-01-09 — End: 2021-03-06

## 2021-01-09 NOTE — Progress Notes (Signed)
[place] Telephone:(336) [phone]   Fax:(336) [place] [address]  DIAGNOSIS: Stage IIIC non-small cell lung cancer, squamous cell carcinoma.  He presented with a right upper lobe mass and precardial lymphadenopathy and ipsilateral and contralateral hilar lymphadenopathy.  He was diagnosed in April 2021  PRIOR THERAPY: 1) Weekly concurrent chemoradiation with carboplatin for an AUC of 2 and paclitaxel 45 mg per metered squared.Status post6cycles.Taxol discontinued from treatment plan starting from cycle #3 due to adverse reaction.Last dose of chemotherapy 06/11/2020. Last day of radiation 06/14/2020. 2)  Consolidation immunotherapy with Imfinzi 1500 mg IV every 4 weeks.  First dose expected on 07/18/20.  Status post 3 cycles.  CURRENT THERAPY:  First-line systemic chemotherapy with carboplatin for AUC of 5, paclitaxel 175 mg/M2 and Keytruda 200 mg IV every 3 weeks with Neulasta support.  1st dose January 16, 2021.  INTERVAL HISTORY: George [name] 80 y.o. male returns to the clinic today for follow-up visit.  The patient is feeling fine today with no concerning complaints except for the baseline shortness of breath.  He denied having any chest pain, cough or hemoptysis.  He denied having any fever or chills.  He has no nausea, vomiting, diarrhea or constipation.  He has no headache or visual changes.  He was tolerating his treatment with Imfinzi fairly well.  The patient had repeat CT scan of the chest performed recently and he is here for evaluation and discussion of his scan results.  MEDICAL HISTORY: Past Medical History:  Diagnosis Date  . Cancer [facility]) Prostate  . Hypertension   . Lung cancer ([place])     ALLERGIES:  is allergic to aspirin.  MEDICATIONS:  Current Outpatient Medications  Medication Sig Dispense Refill  . acetaminophen (TYLENOL) 500 MG tablet Take 500  mg by mouth every 6 (six) hours as needed for moderate pain.    Marland Kitchen levothyroxine (SYNTHROID) 50 MCG tablet Take 1 tablet (50 mcg total) by mouth daily before breakfast. 30 tablet 1  . prochlorperazine (COMPAZINE) 10 MG tablet Take 1 tablet (10 mg total) by mouth every 6 (six) hours as needed. 30 tablet 2  . sucralfate (CARAFATE) 1 g tablet Take 1 tablet (1 g total) by mouth 4 (four) times daily -  with meals and at bedtime. Crush and dissolve in 15 mL of warm water prior to swallowing 60 tablet 1   No current facility-administered medications for this visit.    SURGICAL HISTORY: History reviewed. No pertinent surgical history.  REVIEW OF SYSTEMS:  Constitutional: positive for fatigue Eyes: negative Ears, nose, mouth, throat, and face: negative Respiratory: positive for dyspnea on exertion Cardiovascular: negative Gastrointestinal: negative Genitourinary:negative Integument/breast: negative Hematologic/lymphatic: negative Musculoskeletal:negative Neurological: negative Behavioral/Psych: negative Endocrine: negative Allergic/Immunologic: negative   PHYSICAL EXAMINATION: General appearance: alert, cooperative, fatigued and no distress Head: Normocephalic, without obvious abnormality, atraumatic Neck: no adenopathy, no JVD, supple, symmetrical, trachea midline and thyroid not enlarged, symmetric, no tenderness/mass/nodules Lymph nodes: Cervical, supraclavicular, and axillary nodes normal. Resp: clear to auscultation bilaterally Back: symmetric, no curvature. ROM normal. No CVA tenderness. Cardio: regular rate and rhythm, S1, S2 normal, no murmur, click, rub or gallop GI: soft, non-tender; bowel sounds normal; no masses,  no organomegaly Extremities: extremities normal, atraumatic, no cyanosis or edema Neurologic: Alert and oriented X 3, normal strength and tone. Normal symmetric reflexes. Normal coordination and gait  ECOG PERFORMANCE STATUS: 1 - Symptomatic but completely  ambulatory  Blood pressure 115/74, pulse 87, temperature 98.2 F (36.8 C), temperature source Tympanic, resp. rate 18, height 5\' 6"  (1.676 m), weight 95 lb 9.6 oz (43.4 kg), SpO2 90 %.  LABORATORY DATA: Lab Results  Component Value Date   WBC 3.4 (L) 01/09/2021   HGB 12.9 (L) 01/09/2021   HCT 41.3 01/09/2021   MCV 85.2 01/09/2021   PLT 89 (L) 01/09/2021      Chemistry      Component Value Date/Time   NA 138 12/13/2020 1335   K 5.2 (H) 12/13/2020 1335   CL 102 12/13/2020 1335   CO2 26 12/13/2020 1335   BUN 12 12/13/2020 1335   CREATININE 1.02 12/13/2020 1335      Component Value Date/Time   CALCIUM 9.9 12/13/2020 1335   ALKPHOS 68 12/13/2020 1335   AST 14 (L) 12/13/2020 1335   ALT 7 12/13/2020 1335   BILITOT 0.6 12/13/2020 1335       RADIOGRAPHIC STUDIES: CT Chest W Contrast  Result Date: 01/06/2021 CLINICAL DATA:  Non-small cell lung cancer.  Restaging. EXAM: CT CHEST WITH CONTRAST TECHNIQUE: Multidetector CT imaging of the chest was performed during intravenous contrast administration. CONTRAST:  29mL OMNIPAQUE IOHEXOL 300 MG/ML  SOLN COMPARISON:  10/09/2020 FINDINGS: Cardiovascular: Low heart size appears within normal limits. Aortic atherosclerosis. Coronary artery atherosclerotic calcifications. No pericardial effusion. Mediastinum/Nodes: Thyroid gland appears atrophic or surgically absent. The trachea appears patent and is midline. Normal appearance of the esophagus. No axillary, supraclavicular, or mediastinal adenopathy. Index right paratracheal lymph node measures 0.8 cm, image 70/2. Previously 0.9 cm. Right hilar lymph node measures 1.1 cm, image 73/2. Previously 1.3 cm. Lungs/Pleura: Moderate changes of centrilobular and paraseptal emphysema. Small right pleural effusion, unchanged. Within the area of masslike architectural distortion in the right upper lobe there is progressive enhancing tumor compared with 10/09/2020. This measures 4.3 x 2.4 by 4.2 cm. Previously the  area of enhancing tumor was less conspicuous and difficult to distinguish from masslike architectural distortion due to post treatment change. The best estimate is this area measured 2.7 by 2.8 by 2.8 cm. Signs of progressive chest wall involvement noted, image 69/2. Small nodule in the posterior right lower lobe measures 5 mm, image 91/7. Upper Abdomen: No acute findings. Aortic atherosclerosis. Right renal cortical scarring. Unchanged peripherally enhancing structure in the posterior right hepatic lobe which likely represents a benign abnormality such as a flash fill hemangioma or perfusion anomaly. Musculoskeletal: Mild superior endplate T3 compression deformity is unchanged. Moderate T9 compression fracture is also unchanged. No acute or suspicious osseous findings identified. IMPRESSION: 1. Interval progression of enhancing tumor within the anterior basal right upper lobe with concern for progressive chest wall involvement. 2. Borderline enlarged right hilar and right paratracheal lymph nodes are stable to slightly decreased in size in the interval. 3. Small right pleural effusion, unchanged. 4. Emphysema and aortic atherosclerosis. 5. Coronary artery calcifications. 6. Stable T3 and T9 compression fractures. Aortic Atherosclerosis (ICD10-I70.0) and Emphysema (ICD10-J43.9). Electronically Signed   By: Kerby Moors M.D.   On: 01/06/2021 09:20    ASSESSMENT AND PLAN: This is a very pleasant 80 years old African-American male recently diagnosed with stage IIIc non-small cell lung cancer, squamous cell carcinoma presented with right upper lobe lung mass in addition to ipsilateral and contralateral hilar lymphadenopathy diagnosed in April 2021. Status post a course of concurrent chemoradiation with weekly carboplatin and paclitaxel for 6 cycles with partial response. The patient is currently undergoing consolidation treatment with immunotherapy with  Imfinzi 1500 mg IV every 4 weeks status post 3 cycles.    The patient has been tolerating this treatment well with no concerning adverse effects. He had repeat CT scan of the chest performed recently.  I personally and independently reviewed the scan images and discussed the results with the patient today. Unfortunately his scan showed interval progression of the enhancing tumor within the anterior basal right upper lobe was concern for progressive chest wall involvement.  He continues to have stable borderline enlarged right hilar and right paratracheal lymph nodes. I had a lengthy discussion with the patient today about his current condition and treatment options. I recommended for the patient to discontinue his current treatment with consolidation Imfinzi because of the disease progression. I discussed with the patient the option for treatment of his condition including palliative care versus palliative systemic chemotherapy with carboplatin for AUC of 5, paclitaxel 175 mg/M2 and Keytruda 200 mg IV every 3 weeks with Neulasta support.  I discussed with the patient the adverse effect of this treatment. The patient is interested in proceeding with the treatment and he is expected to start the 1st cycle of this treatment next week. I will see him back for follow-up visit in 2 weeks for evaluation and management of any adverse effect of his treatment. He was advised to call immediately if he has any other concerning symptoms in the interval. The patient voices understanding of current disease status and treatment options and is in agreement with the current care plan. All questions were answered. The patient knows to call the clinic with any problems, questions or concerns. We can certainly see the patient much sooner if necessary.  Disclaimer: This note was dictated with voice recognition software. Similar sounding words can inadvertently be transcribed and may not be corrected upon review.

## 2021-01-09 NOTE — Patient Instructions (Signed)
Steps to Quit Smoking Smoking tobacco is the leading cause of preventable death. It can affect almost every organ in the body. Smoking puts you and people around you at risk for many serious, long-lasting (chronic) diseases. Quitting smoking can be hard, but it is one of the best things that you can do for your health. It is never too late to quit. How do I get ready to quit? When you decide to quit smoking, make a plan to help you succeed. Before you quit:  Pick a date to quit. Set a date within the next 2 weeks to give you time to prepare.  Write down the reasons why you are quitting. Keep this list in places where you will see it often.  Tell your family, friends, and co-workers that you are quitting. Their support is important.  Talk with your doctor about the choices that may help you quit.  Find out if your health insurance will pay for these treatments.  Know the people, places, things, and activities that make you want to smoke (triggers). Avoid them. What first steps can I take to quit smoking?  Throw away all cigarettes at home, at work, and in your car.  Throw away the things that you use when you smoke, such as ashtrays and lighters.  Clean your car. Make sure to empty the ashtray.  Clean your home, including curtains and carpets. What can I do to help me quit smoking? Talk with your doctor about taking medicines and seeing a counselor at the same time. You are more likely to succeed when you do both.  If you are pregnant or breastfeeding, talk with your doctor about counseling or other ways to quit smoking. Do not take medicine to help you quit smoking unless your doctor tells you to do so. To quit smoking: Quit right away  Quit smoking totally, instead of slowly cutting back on how much you smoke over a period of time.  Go to counseling. You are more likely to quit if you go to counseling sessions regularly. Take medicine You may take medicines to help you quit. Some  medicines need a prescription, and some you can buy over-the-counter. Some medicines may contain a drug called nicotine to replace the nicotine in cigarettes. Medicines may:  Help you to stop having the desire to smoke (cravings).  Help to stop the problems that come when you stop smoking (withdrawal symptoms). Your doctor may ask you to use:  Nicotine patches, gum, or lozenges.  Nicotine inhalers or sprays.  Non-nicotine medicine that is taken by mouth. Find resources Find resources and other ways to help you quit smoking and remain smoke-free after you quit. These resources are most helpful when you use them often. They include:  Online chats with a counselor.  Phone quitlines.  Printed self-help materials.  Support groups or group counseling.  Text messaging programs.  Mobile phone apps. Use apps on your mobile phone or tablet that can help you stick to your quit plan. There are many free apps for mobile phones and tablets as well as websites. Examples include Quit Guide from the CDC and smokefree.gov   What things can I do to make it easier to quit?  Talk to your family and friends. Ask them to support and encourage you.  Call a phone quitline (1-800-QUIT-NOW), reach out to support groups, or work with a counselor.  Ask people who smoke to not smoke around you.  Avoid places that make you want to smoke,   such as: ? Bars. ? Parties. ? Smoke-break areas at work.  Spend time with people who do not smoke.  Lower the stress in your life. Stress can make you want to smoke. Try these things to help your stress: ? Getting regular exercise. ? Doing deep-breathing exercises. ? Doing yoga. ? Meditating. ? Doing a body scan. To do this, close your eyes, focus on one area of your body at a time from head to toe. Notice which parts of your body are tense. Try to relax the muscles in those areas.   How will I feel when I quit smoking? Day 1 to 3 weeks Within the first 24 hours,  you may start to have some problems that come from quitting tobacco. These problems are very bad 2-3 days after you quit, but they do not often last for more than 2-3 weeks. You may get these symptoms:  Mood swings.  Feeling restless, nervous, angry, or annoyed.  Trouble concentrating.  Dizziness.  Strong desire for high-sugar foods and nicotine.  Weight gain.  Trouble pooping (constipation).  Feeling like you may vomit (nausea).  Coughing or a sore throat.  Changes in how the medicines that you take for other issues work in your body.  Depression.  Trouble sleeping (insomnia). Week 3 and afterward After the first 2-3 weeks of quitting, you may start to notice more positive results, such as:  Better sense of smell and taste.  Less coughing and sore throat.  Slower heart rate.  Lower blood pressure.  Clearer skin.  Better breathing.  Fewer sick days. Quitting smoking can be hard. Do not give up if you fail the first time. Some people need to try a few times before they succeed. Do your best to stick to your quit plan, and talk with your doctor if you have any questions or concerns. Summary  Smoking tobacco is the leading cause of preventable death. Quitting smoking can be hard, but it is one of the best things that you can do for your health.  When you decide to quit smoking, make a plan to help you succeed.  Quit smoking right away, not slowly over a period of time.  When you start quitting, seek help from your doctor, family, or friends. This information is not intended to replace advice given to you by your health care provider. Make sure you discuss any questions you have with your health care provider. Document Revised: 08/18/2019 Document Reviewed: 02/11/2019 Elsevier Patient Education  2021 Elsevier Inc.  

## 2021-01-09 NOTE — Progress Notes (Signed)
DISCONTINUE ON PATHWAY REGIMEN - Non-Small Cell Lung     A cycle is every 28 days:     Durvalumab   **Always confirm dose/schedule in your pharmacy ordering system**  REASON: Disease Progression PRIOR TREATMENT: [id number]: Durvalumab 1,500 mg q28 Days x up to 12 Months TREATMENT RESPONSE: Progressive Disease (PD)  START OFF PATHWAY REGIMEN - Non-Small Cell Lung   [id number]:Carboplatin AUC=6 IV D1 + Paclitaxel 200 mg/m2 IV D1 + Pembrolizumab 200 mg IV D1 q21 Days x 4 Cycles:   A cycle is every 21 days:     Pembrolizumab      Paclitaxel      Carboplatin   **Always confirm dose/schedule in your pharmacy ordering system**  Patient Characteristics: Local Recurrence Therapeutic Status: Local Recurrence Intent of Therapy: Non-Curative / Palliative Intent, Discussed with Patient

## 2021-01-09 NOTE — Telephone Encounter (Signed)
George [name] updated on new plan of care.

## 2021-01-09 NOTE — Telephone Encounter (Signed)
I called the patient's niece to see if she can make sure that the patient has picked up his prescription for synthroid and to confirm that he is taking it. From reviewing his labs today, I suspect he is not taking the new prescription of synthroid. She is going to make sure that he has picked up his medication and to help manage his pill box to make sure he is taking them.

## 2021-01-15 ENCOUNTER — Other Ambulatory Visit: Payer: Self-pay

## 2021-01-15 ENCOUNTER — Inpatient Hospital Stay: Payer: No Typology Code available for payment source

## 2021-01-15 DIAGNOSIS — Z5112 Encounter for antineoplastic immunotherapy: Secondary | ICD-10-CM | POA: Diagnosis not present

## 2021-01-15 DIAGNOSIS — C3411 Malignant neoplasm of upper lobe, right bronchus or lung: Secondary | ICD-10-CM

## 2021-01-15 LAB — CMP (CANCER CENTER ONLY)
ALT: 17 U/L (ref 0–44)
AST: 25 U/L (ref 15–41)
Albumin: 3.3 g/dL — ABNORMAL LOW (ref 3.5–5.0)
Alkaline Phosphatase: 68 U/L (ref 38–126)
Anion gap: 8 (ref 5–15)
BUN: 12 mg/dL (ref 8–23)
CO2: 27 mmol/L (ref 22–32)
Calcium: 9.5 mg/dL (ref 8.9–10.3)
Chloride: 103 mmol/L (ref 98–111)
Creatinine: 1.12 mg/dL (ref 0.61–1.24)
GFR, Estimated: >60 mL/min (ref >60)
Glucose, Bld: 117 mg/dL — ABNORMAL HIGH (ref 70–99)
Potassium: 3.8 mmol/L (ref 3.5–5.1)
Sodium: 138 mmol/L (ref 135–145)
Total Bilirubin: 0.5 mg/dL (ref 0.3–1.2)
Total Protein: 7.9 g/dL (ref 6.5–8.1)

## 2021-01-15 LAB — CBC WITH DIFFERENTIAL (CANCER CENTER ONLY)
Abs Immature Granulocytes: 0.01 10*3/uL (ref 0.00–0.07)
Basophils Absolute: 0 10*3/uL (ref 0.0–0.1)
Basophils Relative: 0 %
Eosinophils Absolute: 0 10*3/uL (ref 0.0–0.5)
Eosinophils Relative: 0 %
HCT: 44.1 % (ref 39.0–52.0)
Hemoglobin: 13.8 g/dL (ref 13.0–17.0)
Immature Granulocytes: 0 %
Lymphocytes Relative: 24 %
Lymphs Abs: 0.7 10*3/uL (ref 0.7–4.0)
MCH: 27 pg (ref 26.0–34.0)
MCHC: 31.3 g/dL (ref 30.0–36.0)
MCV: 86.1 fL (ref 80.0–100.0)
Monocytes Absolute: 0.3 10*3/uL (ref 0.1–1.0)
Monocytes Relative: 10 %
Neutro Abs: 1.8 10*3/uL (ref 1.7–7.7)
Neutrophils Relative %: 66 %
Platelet Count: 89 10*3/uL — ABNORMAL LOW (ref 150–400)
RBC: 5.12 MIL/uL (ref 4.22–5.81)
RDW: 17.2 % — ABNORMAL HIGH (ref 11.5–15.5)
WBC Count: 2.8 10*3/uL — ABNORMAL LOW (ref 4.0–10.5)
nRBC: 0 % (ref 0.0–0.2)

## 2021-01-16 ENCOUNTER — Encounter: Payer: Self-pay | Admitting: *Deleted

## 2021-01-16 ENCOUNTER — Other Ambulatory Visit: Payer: Self-pay | Admitting: Internal Medicine

## 2021-01-16 ENCOUNTER — Inpatient Hospital Stay: Payer: No Typology Code available for payment source

## 2021-01-16 VITALS — BP 119/72 | HR 79 | Temp 97.8°F | Resp 18

## 2021-01-16 DIAGNOSIS — C3411 Malignant neoplasm of upper lobe, right bronchus or lung: Secondary | ICD-10-CM

## 2021-01-16 DIAGNOSIS — Z5112 Encounter for antineoplastic immunotherapy: Secondary | ICD-10-CM | POA: Diagnosis not present

## 2021-01-16 MED ORDER — PALONOSETRON HCL INJECTION 0.25 MG/5ML
0.2500 mg | Freq: Once | INTRAVENOUS | Status: AC
  Administered 2021-01-16: 0.25 mg via INTRAVENOUS

## 2021-01-16 MED ORDER — SODIUM CHLORIDE 0.9 % IV SOLN
Freq: Once | INTRAVENOUS | Status: AC
  Filled 2021-01-16: qty 250

## 2021-01-16 MED ORDER — PALONOSETRON HCL INJECTION 0.25 MG/5ML
INTRAVENOUS | Status: AC
  Filled 2021-01-16: qty 5

## 2021-01-16 MED ORDER — DIPHENHYDRAMINE HCL 50 MG/ML IJ SOLN
50.0000 mg | Freq: Once | INTRAMUSCULAR | Status: AC
  Administered 2021-01-16: 50 mg via INTRAVENOUS

## 2021-01-16 MED ORDER — DIPHENHYDRAMINE HCL 50 MG/ML IJ SOLN
INTRAMUSCULAR | Status: AC
  Filled 2021-01-16: qty 1

## 2021-01-16 MED ORDER — SODIUM CHLORIDE 0.9 % IV SOLN
200.0000 mg | Freq: Once | INTRAVENOUS | Status: AC
  Administered 2021-01-16: 200 mg via INTRAVENOUS
  Filled 2021-01-16: qty 8

## 2021-01-16 MED ORDER — FAMOTIDINE IN NACL 20-0.9 MG/50ML-% IV SOLN
INTRAVENOUS | Status: AC
  Filled 2021-01-16: qty 50

## 2021-01-16 MED ORDER — SODIUM CHLORIDE 0.9 % IV SOLN
150.0000 mg/m2 | Freq: Once | INTRAVENOUS | Status: DC
  Filled 2021-01-16: qty 36

## 2021-01-16 MED ORDER — FAMOTIDINE IN NACL 20-0.9 MG/50ML-% IV SOLN
20.0000 mg | Freq: Once | INTRAVENOUS | Status: AC
  Administered 2021-01-16: 20 mg via INTRAVENOUS

## 2021-01-16 MED ORDER — SODIUM CHLORIDE 0.9 % IV SOLN
233.6000 mg | Freq: Once | INTRAVENOUS | Status: AC
  Administered 2021-01-16: 230 mg via INTRAVENOUS
  Filled 2021-01-16: qty 23

## 2021-01-16 MED ORDER — SODIUM CHLORIDE 0.9 % IV SOLN
10.0000 mg | Freq: Once | INTRAVENOUS | Status: AC
  Administered 2021-01-16: 10 mg via INTRAVENOUS
  Filled 2021-01-16: qty 10

## 2021-01-16 MED ORDER — SODIUM CHLORIDE 0.9 % IV SOLN
150.0000 mg | Freq: Once | INTRAVENOUS | Status: AC
  Administered 2021-01-16: 150 mg via INTRAVENOUS
  Filled 2021-01-16: qty 150

## 2021-01-16 NOTE — Progress Notes (Signed)
Per secure chat, Dr Julien Nordmann lower the treatment dose and it is ok to treat pt. with platelets 89. Pt discharged in no apparent distress. Pt left ambulatory with wheelchair. Pt aware of discharge instructions and verbalized understanding and had no further questions.

## 2021-01-16 NOTE — Patient Instructions (Signed)
[facility] Discharge Instructions for Patients Receiving Chemotherapy  Today you received the following chemotherapy agents Paraplatin, Keytruda.  To help prevent nausea and vomiting after your treatment, we encourage you to take your nausea medication as indicated by your MD.   If you develop nausea and vomiting that is not controlled by your nausea medication, call the clinic.   BELOW ARE SYMPTOMS THAT SHOULD BE REPORTED IMMEDIATELY:  *FEVER GREATER THAN 100.5 F  *CHILLS WITH OR WITHOUT FEVER  NAUSEA AND VOMITING THAT IS NOT CONTROLLED WITH YOUR NAUSEA MEDICATION  *UNUSUAL SHORTNESS OF BREATH  *UNUSUAL BRUISING OR BLEEDING  TENDERNESS IN MOUTH AND THROAT WITH OR WITHOUT PRESENCE OF ULCERS  *URINARY PROBLEMS  *BOWEL PROBLEMS  UNUSUAL RASH Items with * indicate a potential emergency and should be followed up as soon as possible.  Feel free to call the clinic should you have any questions or concerns. The clinic phone number is [phone].  Please show the [place] at check-in to the Emergency Department and triage nurse.

## 2021-01-16 NOTE — Progress Notes (Unsigned)
Hold Taxol and give Keytruda and Carboplatin today per order of Dr. Julien Nordmann.  L. Halcomb notified.

## 2021-01-23 ENCOUNTER — Inpatient Hospital Stay: Payer: No Typology Code available for payment source

## 2021-01-23 ENCOUNTER — Inpatient Hospital Stay: Payer: No Typology Code available for payment source | Admitting: Physician Assistant

## 2021-01-24 NOTE — Progress Notes (Signed)
George [name] [place] [id number]  DIAGNOSIS: Stage IIIC non-small cell lung cancer, squamous cell carcinoma.  He presented with a right upper lobe mass and precardial lymphadenopathy and ipsilateral and contralateral hilar lymphadenopathy.  He was diagnosed in April 2021  PRIOR THERAPY: 1)  Weekly concurrent chemoradiation with carboplatin for an AUC of 2 and paclitaxel 45 mg per metered squared. Status post 6 cycles. Taxol discontinued from treatment plan starting from cycle #3 due to adverse reaction. Last dose of chemotherapy 06/11/2020. Last day of radiation 06/14/2020  2) Consolidation immunotherapy with Imfinzi 1500 mg IV every 4 weeks.  First dose on 07/19/20. Status post 5 cycles.  Discontinued due to evidence of disease progression.   CURRENT THERAPY: Palliative systemic chemotherapy with dose reduced carboplatin for an AUC of 4, Keytruda 200 mg, and Abraxane IV every 3 weeks with Neulasta support.  Taxol was removed from the patient's treatment plan due to infusion reaction.  He is status post 1 cycle.  First dose received on 01/16/2021.  INTERVAL HISTORY: George [name] 80 y.o. male returns to the clinic today for a follow-up visit.  The patient is feeling well today without any concerning complaints.  The patient recently had evidence of disease progression.  His treatment with immunotherapy with Imfinzi was discontinued and he was started back on systemic palliative chemotherapy/immunotherapy.  Due to the patient's prior infusion reaction with Taxol, he did not receive Taxol with cycle #1.  Moving forward with cycle #2 patient will receive carboplatin reduced dose, Keytruda, and Abraxane.   The patient underwent his first cycle of his new treatment last week and tolerated it well without any concerning/appreciable adverse side effects.  The patient's labs from last week showed persistent  hypothyroidism. It does not sound like the patient is compliant with his synthroid. The patient denies any recent fever, chills, night sweats, or weight loss.  He actually gained 2 more pounds. He denies any chest pain or hemoptysis but reports his mild baseline shortness of breath with exertion and occasional cough for which he takes Robitussin.  He denies any nausea, vomiting, diarrhea, or constipation.  He denies any headaches.  He denies any rashes or skin changes.  He denies any abnormal bleeding or bruising.  The patient is here today for evaluation and a 1 week follow-up visit to manage any adverse side effects of treatment.Marland Kitchen  MEDICAL HISTORY: Past Medical History:  Diagnosis Date  . Cancer [facility]) Prostate  . Hypertension   . Lung cancer ([place])     ALLERGIES:  is allergic to paclitaxel and aspirin.  MEDICATIONS:  Current Outpatient Medications  Medication Sig Dispense Refill  . acetaminophen (TYLENOL) 500 MG tablet Take 500 mg by mouth every 6 (six) hours as needed for moderate pain.    Marland Kitchen albuterol (VENTOLIN HFA) 108 (90 Base) MCG/ACT inhaler INHALE 2 PUFFS BY MOUTH FOUR TIMES A DAY AS NEEDED    . ascorbic acid (VITAMIN C) 500 MG tablet TAKE ONE TABLET BY MOUTH MONDAY, WEDNESDAY AND FRIDAY  WITH FERROUS GLUCONATE    . benzonatate (TESSALON) 100 MG capsule Take 1 capsule by mouth 3 (three) times daily as needed.    . Cholecalciferol 50 MCG (2000 UT) TABS Take 2 tablets by mouth daily.    . ferrous gluconate (FERGON) 324 MG tablet TAKE ONE TABLET BY MOUTH MONDAY, WEDNESDAY AND FRIDAY    . levothyroxine (SYNTHROID) 50 MCG tablet Take 1 tablet (50  mcg total) by mouth daily before breakfast. 30 tablet 1  . Nutritional Supplements (ENSURE ACTIVE PO) TAKE 1 CAN BY MOUTH THREE TIMES A DAY    . potassium chloride (MICRO-K) 10 MEQ CR capsule Take 1 capsule by mouth daily.    . prochlorperazine (COMPAZINE) 10 MG tablet Take 1 tablet (10 mg total) by mouth every 6 (six) hours as needed. 30 tablet 2   . sucralfate (CARAFATE) 1 g tablet Take 1 tablet (1 g total) by mouth 4 (four) times daily -  with meals and at bedtime. Crush and dissolve in 15 mL of warm water prior to swallowing 60 tablet 1  . vitamin B-12 (CYANOCOBALAMIN) 500 MCG tablet Take 2 tablets by mouth daily.     No current facility-administered medications for this visit.    SURGICAL HISTORY: No past surgical history on file.  REVIEW OF SYSTEMS:   HENT: Negative for mouth sores, nosebleeds, sore throat and trouble swallowing.  Eyes: Negative for eye problems and icterus.  Respiratory: Positive for mild baseline cough and shortness of breath. Negative for hemoptysis and wheezing.  Cardiovascular: Negative for chest pain and leg swelling.  Gastrointestinal: Negative for abdominal pain, constipation, diarrhea, nausea and vomiting.  Genitourinary: Negative for bladder incontinence, difficulty urinating, dysuria, frequency and hematuria.  Musculoskeletal: Negative for back pain, gait problem, neck pain and neck stiffness.  Skin: Negative for itching and rash.  Neurological: Negative for dizziness, extremity weakness, gait problem, headaches, light-headedness and seizures.  Hematological: Negative for adenopathy. Does not bruise/bleed easily.  Psychiatric/Behavioral: Negative for confusion, depression and sleep disturbance. The patient is not nervous/anxious.   PHYSICAL EXAMINATION:  Blood pressure (!) 132/91, pulse (!) 102, temperature (!) 97.3 F (36.3 C), temperature source Tympanic, resp. rate 12, height 5' 6.5" (1.689 m), weight 97 lb 1.6 oz (44 kg), SpO2 95 %.  ECOG PERFORMANCE STATUS: 1 - Symptomatic but completely ambulatory  Physical Exam  Constitutional: Oriented to person, place, and time andcachetic appearing maleand in no distress.  HENT:  Head: Normocephalic and atraumatic.  Mouth/Throat: Oropharynx is clear and moist. No oropharyngeal exudate.  Eyes: Conjunctivae are normal. Right eye exhibits no  discharge. Left eye exhibits no discharge. No scleral icterus.  Neck: Normal range of motion. Neck supple.  Cardiovascular: Normal rate, regular rhythm, normal heart sounds and intact distal pulses.  Pulmonary/Chest: Effort normal.Quiet breath sounds in all lung fields.No respiratory distress. No wheezes. No rales.  Abdominal: Soft. Bowel sounds are normal. Exhibits no distension and no mass. There is no tenderness.  Musculoskeletal: Normal range of motion. Exhibits no edema.  Lymphadenopathy:  No cervical adenopathy.  Neurological: Alert and oriented to person, place, and time. Exhibits muscle wasting. Examined in the wheelchair. Skin: Skin is warm and dry. No rash noted. Not diaphoretic. No erythema. No pallor.  Psychiatric: Mood, memory and judgment normal.  Vitals reviewed.  LABORATORY DATA: Lab Results  Component Value Date   WBC 3.1 (L) 01/28/2021   HGB 12.8 (L) 01/28/2021   HCT 40.6 01/28/2021   MCV 86.6 01/28/2021   PLT 144 (L) 01/28/2021      Chemistry      Component Value Date/Time   NA 141 01/28/2021 1359   K 4.0 01/28/2021 1359   CL 101 01/28/2021 1359   CO2 30 01/28/2021 1359   BUN 10 01/28/2021 1359   CREATININE 1.15 01/28/2021 1359      Component Value Date/Time   CALCIUM 9.3 01/28/2021 1359   ALKPHOS 66 01/28/2021 1359   AST 19  01/28/2021 1359   ALT 18 01/28/2021 1359   BILITOT 0.5 01/28/2021 1359       RADIOGRAPHIC STUDIES:  CT Chest W Contrast  Result Date: 01/06/2021 CLINICAL DATA:  Non-small cell lung cancer.  Restaging. EXAM: CT CHEST WITH CONTRAST TECHNIQUE: Multidetector CT imaging of the chest was performed during intravenous contrast administration. CONTRAST:  52mL OMNIPAQUE IOHEXOL 300 MG/ML  SOLN COMPARISON:  10/09/2020 FINDINGS: Cardiovascular: Low heart size appears within normal limits. Aortic atherosclerosis. Coronary artery atherosclerotic calcifications. No pericardial effusion. Mediastinum/Nodes: Thyroid gland appears atrophic or  surgically absent. The trachea appears patent and is midline. Normal appearance of the esophagus. No axillary, supraclavicular, or mediastinal adenopathy. Index right paratracheal lymph node measures 0.8 cm, image 70/2. Previously 0.9 cm. Right hilar lymph node measures 1.1 cm, image 73/2. Previously 1.3 cm. Lungs/Pleura: Moderate changes of centrilobular and paraseptal emphysema. Small right pleural effusion, unchanged. Within the area of masslike architectural distortion in the right upper lobe there is progressive enhancing tumor compared with 10/09/2020. This measures 4.3 x 2.4 by 4.2 cm. Previously the area of enhancing tumor was less conspicuous and difficult to distinguish from masslike architectural distortion due to post treatment change. The best estimate is this area measured 2.7 by 2.8 by 2.8 cm. Signs of progressive chest wall involvement noted, image 69/2. Small nodule in the posterior right lower lobe measures 5 mm, image 91/7. Upper Abdomen: No acute findings. Aortic atherosclerosis. Right renal cortical scarring. Unchanged peripherally enhancing structure in the posterior right hepatic lobe which likely represents a benign abnormality such as a flash fill hemangioma or perfusion anomaly. Musculoskeletal: Mild superior endplate T3 compression deformity is unchanged. Moderate T9 compression fracture is also unchanged. No acute or suspicious osseous findings identified. IMPRESSION: 1. Interval progression of enhancing tumor within the anterior basal right upper lobe with concern for progressive chest wall involvement. 2. Borderline enlarged right hilar and right paratracheal lymph nodes are stable to slightly decreased in size in the interval. 3. Small right pleural effusion, unchanged. 4. Emphysema and aortic atherosclerosis. 5. Coronary artery calcifications. 6. Stable T3 and T9 compression fractures. Aortic Atherosclerosis (ICD10-I70.0) and Emphysema (ICD10-J43.9). Electronically Signed   By: Kerby Moors M.D.   On: 01/06/2021 09:20     ASSESSMENT/PLAN:  This is a very pleasant 80 year old African American male diagnosed with stage IIIC non-small cell lung cancer, squamous cell carcinoma.  He presented with a right upper lobe mass precardial lymphadenopathy and ipsilateral and contralateral hilar lymphadenopathy.  He also has a faint area of hypermetabolic activity left femoral shaft that Dr. Sondra Come did not feel was suspicious for metastasis.  He was diagnosed in April 2021.  The patient completed 6 cycles of weekly concurrent chemoradiation with carboplatin for an AUC of 2 and paclitaxel 45 mg/m.  His last dose of chemotherapy was on 06/11/2020.  The patient had a reaction to Taxol and this was removed from his treatment plan starting from cycle #3.   The patient then underwent consolidation immunotherapy with Imfinzi 1500 mg IV every 4 weeks.  The patient is status post 5 cycles.  He has been tolerating this treatment well but this was discontinued due to evidence of disease progression.   The patient was then started on palliative systemic chemotherapy with carboplatin reduced dose for an AUC of 4, Abraxane, and Keytruda 200 mg IV every 3 weeks with Neulasta support.  The patient has a prior hypersensitivity reaction to Taxol.  For his first cycle of treatment, he only received  carboplatin and [place]. He tolerated it well without any adverse side effects.    Labs were reviewed. Interestingly, the patient has had thrombocytopenia for around 4-5 months with a platelet count <100k. Despite receiving chemotherapy 2 weeks ago, his thrombocytopenia improved and his platelet count is 144k today.   We will see the patient back for follow-up visit in 1 weeks for evaluation before starting cycle #2.   Strongly encouraged the patient to take his synthroid for his hypothyroidism as prescribed.   The patient was advised to call immediately if he has any concerning symptoms in the interval. The  patient voices understanding of current disease status and treatment options and is in agreement with the current care plan. All questions were answered. The patient knows to call the clinic with any problems, questions or concerns. We can certainly see the patient much sooner if necessary      No orders of the defined types were placed in this encounter.    I spent 20-29 minutes in this encounter.   George Luallen L Noam Franzen, PA-C 01/28/21

## 2021-01-28 ENCOUNTER — Other Ambulatory Visit: Payer: Self-pay

## 2021-01-28 ENCOUNTER — Inpatient Hospital Stay: Payer: No Typology Code available for payment source

## 2021-01-28 ENCOUNTER — Encounter: Payer: Self-pay | Admitting: Physician Assistant

## 2021-01-28 ENCOUNTER — Inpatient Hospital Stay (HOSPITAL_BASED_OUTPATIENT_CLINIC_OR_DEPARTMENT_OTHER): Payer: No Typology Code available for payment source | Admitting: Physician Assistant

## 2021-01-28 VITALS — BP 132/91 | HR 102 | Temp 97.3°F | Resp 12 | Ht 66.5 in | Wt 97.1 lb

## 2021-01-28 DIAGNOSIS — Z5112 Encounter for antineoplastic immunotherapy: Secondary | ICD-10-CM | POA: Diagnosis not present

## 2021-01-28 DIAGNOSIS — C3411 Malignant neoplasm of upper lobe, right bronchus or lung: Secondary | ICD-10-CM

## 2021-01-28 LAB — CBC WITH DIFFERENTIAL (CANCER CENTER ONLY)
Abs Immature Granulocytes: 0.01 10*3/uL (ref 0.00–0.07)
Basophils Absolute: 0 10*3/uL (ref 0.0–0.1)
Basophils Relative: 0 %
Eosinophils Absolute: 0 10*3/uL (ref 0.0–0.5)
Eosinophils Relative: 0 %
HCT: 40.6 % (ref 39.0–52.0)
Hemoglobin: 12.8 g/dL — ABNORMAL LOW (ref 13.0–17.0)
Immature Granulocytes: 0 %
Lymphocytes Relative: 21 %
Lymphs Abs: 0.7 10*3/uL (ref 0.7–4.0)
MCH: 27.3 pg (ref 26.0–34.0)
MCHC: 31.5 g/dL (ref 30.0–36.0)
MCV: 86.6 fL (ref 80.0–100.0)
Monocytes Absolute: 0.4 10*3/uL (ref 0.1–1.0)
Monocytes Relative: 11 %
Neutro Abs: 2.1 10*3/uL (ref 1.7–7.7)
Neutrophils Relative %: 68 %
Platelet Count: 144 10*3/uL — ABNORMAL LOW (ref 150–400)
RBC: 4.69 MIL/uL (ref 4.22–5.81)
RDW: 18.6 % — ABNORMAL HIGH (ref 11.5–15.5)
WBC Count: 3.1 10*3/uL — ABNORMAL LOW (ref 4.0–10.5)
nRBC: 0 % (ref 0.0–0.2)

## 2021-01-28 LAB — CMP (CANCER CENTER ONLY)
ALT: 18 U/L (ref 0–44)
AST: 19 U/L (ref 15–41)
Albumin: 3.3 g/dL — ABNORMAL LOW (ref 3.5–5.0)
Alkaline Phosphatase: 66 U/L (ref 38–126)
Anion gap: 10 (ref 5–15)
BUN: 10 mg/dL (ref 8–23)
CO2: 30 mmol/L (ref 22–32)
Calcium: 9.3 mg/dL (ref 8.9–10.3)
Chloride: 101 mmol/L (ref 98–111)
Creatinine: 1.15 mg/dL (ref 0.61–1.24)
GFR, Estimated: >60 mL/min (ref >60)
Glucose, Bld: 118 mg/dL — ABNORMAL HIGH (ref 70–99)
Potassium: 4 mmol/L (ref 3.5–5.1)
Sodium: 141 mmol/L (ref 135–145)
Total Bilirubin: 0.5 mg/dL (ref 0.3–1.2)
Total Protein: 7.8 g/dL (ref 6.5–8.1)

## 2021-01-29 ENCOUNTER — Telehealth: Payer: Self-pay | Admitting: Physician Assistant

## 2021-01-29 NOTE — Telephone Encounter (Signed)
Scheduled appointments per 2/22 los. Called patient, no answer and no voicemail available. Will have updated calendar printed for patient at next visit.

## 2021-01-30 ENCOUNTER — Inpatient Hospital Stay: Payer: No Typology Code available for payment source

## 2021-02-06 ENCOUNTER — Inpatient Hospital Stay: Payer: No Typology Code available for payment source

## 2021-02-06 ENCOUNTER — Inpatient Hospital Stay: Payer: No Typology Code available for payment source | Admitting: Nutrition

## 2021-02-06 ENCOUNTER — Inpatient Hospital Stay: Payer: No Typology Code available for payment source | Attending: Radiation Oncology

## 2021-02-06 ENCOUNTER — Inpatient Hospital Stay (HOSPITAL_BASED_OUTPATIENT_CLINIC_OR_DEPARTMENT_OTHER): Payer: No Typology Code available for payment source | Admitting: Internal Medicine

## 2021-02-06 ENCOUNTER — Encounter: Payer: Self-pay | Admitting: Internal Medicine

## 2021-02-06 ENCOUNTER — Other Ambulatory Visit: Payer: Self-pay

## 2021-02-06 VITALS — BP 139/87 | HR 81 | Temp 97.9°F | Resp 12 | Ht 66.5 in | Wt 96.0 lb

## 2021-02-06 DIAGNOSIS — Z5112 Encounter for antineoplastic immunotherapy: Secondary | ICD-10-CM | POA: Insufficient documentation

## 2021-02-06 DIAGNOSIS — Z5111 Encounter for antineoplastic chemotherapy: Secondary | ICD-10-CM

## 2021-02-06 DIAGNOSIS — C3411 Malignant neoplasm of upper lobe, right bronchus or lung: Secondary | ICD-10-CM | POA: Diagnosis not present

## 2021-02-06 DIAGNOSIS — I10 Essential (primary) hypertension: Secondary | ICD-10-CM | POA: Insufficient documentation

## 2021-02-06 DIAGNOSIS — Z79899 Other long term (current) drug therapy: Secondary | ICD-10-CM | POA: Diagnosis not present

## 2021-02-06 DIAGNOSIS — Z923 Personal history of irradiation: Secondary | ICD-10-CM | POA: Diagnosis not present

## 2021-02-06 DIAGNOSIS — Z5189 Encounter for other specified aftercare: Secondary | ICD-10-CM | POA: Diagnosis not present

## 2021-02-06 DIAGNOSIS — Z9221 Personal history of antineoplastic chemotherapy: Secondary | ICD-10-CM | POA: Diagnosis not present

## 2021-02-06 LAB — CBC WITH DIFFERENTIAL (CANCER CENTER ONLY)
Abs Immature Granulocytes: 0.01 10*3/uL (ref 0.00–0.07)
Basophils Absolute: 0 10*3/uL (ref 0.0–0.1)
Basophils Relative: 0 %
Eosinophils Absolute: 0 10*3/uL (ref 0.0–0.5)
Eosinophils Relative: 0 %
HCT: 37.1 % — ABNORMAL LOW (ref 39.0–52.0)
Hemoglobin: 11.8 g/dL — ABNORMAL LOW (ref 13.0–17.0)
Immature Granulocytes: 0 %
Lymphocytes Relative: 34 %
Lymphs Abs: 0.9 10*3/uL (ref 0.7–4.0)
MCH: 27.4 pg (ref 26.0–34.0)
MCHC: 31.8 g/dL (ref 30.0–36.0)
MCV: 86.3 fL (ref 80.0–100.0)
Monocytes Absolute: 0.4 10*3/uL (ref 0.1–1.0)
Monocytes Relative: 14 %
Neutro Abs: 1.3 10*3/uL — ABNORMAL LOW (ref 1.7–7.7)
Neutrophils Relative %: 52 %
Platelet Count: 142 10*3/uL — ABNORMAL LOW (ref 150–400)
RBC: 4.3 MIL/uL (ref 4.22–5.81)
RDW: 18.9 % — ABNORMAL HIGH (ref 11.5–15.5)
WBC Count: 2.5 10*3/uL — ABNORMAL LOW (ref 4.0–10.5)
nRBC: 0 % (ref 0.0–0.2)

## 2021-02-06 LAB — CMP (CANCER CENTER ONLY)
ALT: 11 U/L (ref 0–44)
AST: 17 U/L (ref 15–41)
Albumin: 3.5 g/dL (ref 3.5–5.0)
Alkaline Phosphatase: 67 U/L (ref 38–126)
Anion gap: 9 (ref 5–15)
BUN: 9 mg/dL (ref 8–23)
CO2: 28 mmol/L (ref 22–32)
Calcium: 9 mg/dL (ref 8.9–10.3)
Chloride: 104 mmol/L (ref 98–111)
Creatinine: 1.16 mg/dL (ref 0.61–1.24)
GFR, Estimated: >60 mL/min (ref >60)
Glucose, Bld: 103 mg/dL — ABNORMAL HIGH (ref 70–99)
Potassium: 3.7 mmol/L (ref 3.5–5.1)
Sodium: 141 mmol/L (ref 135–145)
Total Bilirubin: 0.5 mg/dL (ref 0.3–1.2)
Total Protein: 8 g/dL (ref 6.5–8.1)

## 2021-02-06 LAB — TSH: TSH: 59.376 u[IU]/mL — ABNORMAL HIGH (ref 0.320–4.118)

## 2021-02-06 MED ORDER — SODIUM CHLORIDE 0.9 % IV SOLN
Freq: Once | INTRAVENOUS | Status: AC
  Filled 2021-02-06: qty 250

## 2021-02-06 MED ORDER — SODIUM CHLORIDE 0.9 % IV SOLN
10.0000 mg | Freq: Once | INTRAVENOUS | Status: AC
  Administered 2021-02-06: 10 mg via INTRAVENOUS
  Filled 2021-02-06: qty 10

## 2021-02-06 MED ORDER — SODIUM CHLORIDE 0.9 % IV SOLN
233.6000 mg | Freq: Once | INTRAVENOUS | Status: AC
  Administered 2021-02-06: 230 mg via INTRAVENOUS
  Filled 2021-02-06: qty 23

## 2021-02-06 MED ORDER — PEMBROLIZUMAB CHEMO INJECTION 100 MG/4ML
200.0000 mg | Freq: Once | INTRAVENOUS | Status: AC
  Administered 2021-02-06: 200 mg via INTRAVENOUS
  Filled 2021-02-06: qty 8

## 2021-02-06 MED ORDER — PACLITAXEL PROTEIN-BOUND CHEMO INJECTION 100 MG
125.0000 mg/m2 | Freq: Once | INTRAVENOUS | Status: AC
  Administered 2021-02-06: 175 mg via INTRAVENOUS
  Filled 2021-02-06: qty 35

## 2021-02-06 MED ORDER — PALONOSETRON HCL INJECTION 0.25 MG/5ML
INTRAVENOUS | Status: AC
  Filled 2021-02-06: qty 5

## 2021-02-06 MED ORDER — SODIUM CHLORIDE 0.9 % IV SOLN
150.0000 mg | Freq: Once | INTRAVENOUS | Status: AC
  Administered 2021-02-06: 150 mg via INTRAVENOUS
  Filled 2021-02-06: qty 150

## 2021-02-06 MED ORDER — PALONOSETRON HCL INJECTION 0.25 MG/5ML
0.2500 mg | Freq: Once | INTRAVENOUS | Status: AC
  Administered 2021-02-06: 0.25 mg via INTRAVENOUS

## 2021-02-06 NOTE — Progress Notes (Signed)
[place] [phone]   Fax:(336) [place] [address]  DIAGNOSIS: Stage IIIC non-small cell lung cancer, squamous cell carcinoma.  He presented with a right upper lobe mass and precardial lymphadenopathy and ipsilateral and contralateral hilar lymphadenopathy.  He was diagnosed in April 2021  PRIOR THERAPY: 1) Weekly concurrent chemoradiation with carboplatin for an AUC of 2 and paclitaxel 45 mg per metered squared.Status post6cycles.Taxol discontinued from treatment plan starting from cycle #3 due to adverse reaction.Last dose of chemotherapy 06/11/2020. Last day of radiation 06/14/2020. 2)  Consolidation immunotherapy with Imfinzi 1500 mg IV every 4 weeks.  First dose expected on 07/18/20.  Status post 3 cycles.  CURRENT THERAPY:  First-line systemic chemotherapy with carboplatin for AUC of 5, paclitaxel 175 mg/M2 and Keytruda 200 mg IV every 3 weeks with Neulasta support.  1st dose January 16, 2021.  INTERVAL HISTORY: George [name] 80 y.o. male returns to the clinic today for follow-up visit.  The patient is feeling fine today with no concerning complaints except for the baseline shortness of breath.  He denied having any chest pain, cough or hemoptysis.  He denied having any fever or chills.  He has no nausea, vomiting, diarrhea or constipation.  He has no headache or visual changes.  He was tolerating his treatment with Imfinzi fairly well.  The patient had repeat CT scan of the chest performed recently and he is here for evaluation and discussion of his scan results.  MEDICAL HISTORY: Past Medical History:  Diagnosis Date   Cancer [facility]) Prostate   Hypertension    Lung cancer ([place])     ALLERGIES:  is allergic to paclitaxel and aspirin.  MEDICATIONS:  Current Outpatient Medications  Medication Sig Dispense Refill   acetaminophen (TYLENOL) 500 MG  tablet Take 500 mg by mouth every 6 (six) hours as needed for moderate pain.     albuterol (VENTOLIN HFA) 108 (90 Base) MCG/ACT inhaler INHALE 2 PUFFS BY MOUTH FOUR TIMES A DAY AS NEEDED     ascorbic acid (VITAMIN C) 500 MG tablet TAKE ONE TABLET BY MOUTH MONDAY, WEDNESDAY AND FRIDAY  WITH FERROUS GLUCONATE     benzonatate (TESSALON) 100 MG capsule Take 1 capsule by mouth 3 (three) times daily as needed.     Cholecalciferol 50 MCG (2000 UT) TABS Take 2 tablets by mouth daily.     ferrous gluconate (FERGON) 324 MG tablet TAKE ONE TABLET BY MOUTH MONDAY, WEDNESDAY AND FRIDAY     levothyroxine (SYNTHROID) 50 MCG tablet Take 1 tablet (50 mcg total) by mouth daily before breakfast. 30 tablet 1   Nutritional Supplements (ENSURE ACTIVE PO) TAKE 1 CAN BY MOUTH THREE TIMES A DAY     potassium chloride (MICRO-K) 10 MEQ CR capsule Take 1 capsule by mouth daily.     prochlorperazine (COMPAZINE) 10 MG tablet Take 1 tablet (10 mg total) by mouth every 6 (six) hours as needed. 30 tablet 2   sucralfate (CARAFATE) 1 g tablet Take 1 tablet (1 g total) by mouth 4 (four) times daily -  with meals and at bedtime. Crush and dissolve in 15 mL of warm water prior to swallowing 60 tablet 1   vitamin B-12 (CYANOCOBALAMIN) 500 MCG tablet Take 2 tablets by mouth daily.     No current facility-administered medications for this visit.    SURGICAL HISTORY: History reviewed. No pertinent surgical history.  REVIEW OF  SYSTEMS:  Constitutional: positive for fatigue Eyes: negative Ears, nose, mouth, throat, and face: negative Respiratory: positive for dyspnea on exertion Cardiovascular: negative Gastrointestinal: negative Genitourinary:negative Integument/breast: negative Hematologic/lymphatic: negative Musculoskeletal:negative Neurological: negative Behavioral/Psych: negative Endocrine: negative Allergic/Immunologic: negative   PHYSICAL EXAMINATION: General appearance: alert, cooperative, fatigued and no  distress Head: Normocephalic, without obvious abnormality, atraumatic Neck: no adenopathy, no JVD, supple, symmetrical, trachea midline and thyroid not enlarged, symmetric, no tenderness/mass/nodules Lymph nodes: Cervical, supraclavicular, and axillary nodes normal. Resp: clear to auscultation bilaterally Back: symmetric, no curvature. ROM normal. No CVA tenderness. Cardio: regular rate and rhythm, S1, S2 normal, no murmur, click, rub or gallop GI: soft, non-tender; bowel sounds normal; no masses,  no organomegaly Extremities: extremities normal, atraumatic, no cyanosis or edema Neurologic: Alert and oriented X 3, normal strength and tone. Normal symmetric reflexes. Normal coordination and gait  ECOG PERFORMANCE STATUS: 1 - Symptomatic but completely ambulatory  Blood pressure 139/87, pulse 81, temperature 97.9 F (36.6 C), temperature source Tympanic, resp. rate 12, height 5' 6.5" (1.689 m), weight 96 lb (43.5 kg), SpO2 97 %.  LABORATORY DATA: Lab Results  Component Value Date   WBC 3.1 (L) 01/28/2021   HGB 12.8 (L) 01/28/2021   HCT 40.6 01/28/2021   MCV 86.6 01/28/2021   PLT 144 (L) 01/28/2021      Chemistry      Component Value Date/Time   NA 141 01/28/2021 1359   K 4.0 01/28/2021 1359   CL 101 01/28/2021 1359   CO2 30 01/28/2021 1359   BUN 10 01/28/2021 1359   CREATININE 1.15 01/28/2021 1359      Component Value Date/Time   CALCIUM 9.3 01/28/2021 1359   ALKPHOS 66 01/28/2021 1359   AST 19 01/28/2021 1359   ALT 18 01/28/2021 1359   BILITOT 0.5 01/28/2021 1359       RADIOGRAPHIC STUDIES: No results found.  ASSESSMENT AND PLAN: This is a very pleasant 80 years old African-American male recently diagnosed with stage IIIc non-small cell lung cancer, squamous cell carcinoma presented with right upper lobe lung mass in addition to ipsilateral and contralateral hilar lymphadenopathy diagnosed in April 2021. Status post a course of concurrent chemoradiation with weekly  carboplatin and paclitaxel for 6 cycles with partial response. The patient is currently undergoing consolidation treatment with immunotherapy with Imfinzi 1500 mg IV every 4 weeks status post 3 cycles.   The patient has been tolerating this treatment well with no concerning adverse effects. He had repeat CT scan of the chest performed recently.  I personally and independently reviewed the scan images and discussed the results with the patient today. Unfortunately his scan showed interval progression of the enhancing tumor within the anterior basal right upper lobe was concern for progressive chest wall involvement.  He continues to have stable borderline enlarged right hilar and right paratracheal lymph nodes. I had a lengthy discussion with the patient today about his current condition and treatment options. I recommended for the patient to discontinue his current treatment with consolidation Imfinzi because of the disease progression. I discussed with the patient the option for treatment of his condition including palliative care versus palliative systemic chemotherapy with carboplatin for AUC of 5, paclitaxel 175 mg/M2 and Keytruda 200 mg IV every 3 weeks with Neulasta support.  I discussed with the patient the adverse effect of this treatment. The patient is interested in proceeding with the treatment and he is expected to start the 1st cycle of this treatment next week. I will see him  back for follow-up visit in 2 weeks for evaluation and management of any adverse effect of his treatment. He was advised to call immediately if he has any other concerning symptoms in the interval. The patient voices understanding of current disease status and treatment options and is in agreement with the current care plan. All questions were answered. The patient knows to call the clinic with any problems, questions or concerns. We can certainly see the patient much sooner if necessary.  Disclaimer: This note was  dictated with voice recognition software. Similar sounding words can inadvertently be transcribed and may not be corrected upon review.            [place] Telephone:(336) [phone]   Fax:(336) [place] [address]  DIAGNOSIS: Stage IIIC non-small cell lung cancer, squamous cell carcinoma.  He presented with a right upper lobe mass and precardial lymphadenopathy and ipsilateral and contralateral hilar lymphadenopathy.  He was diagnosed in April 2021  PRIOR THERAPY: 1) Weekly concurrent chemoradiation with carboplatin for an AUC of 2 and paclitaxel 45 mg per metered squared.Status post6cycles.Taxol discontinued from treatment plan starting from cycle #3 due to adverse reaction.Last dose of chemotherapy 06/11/2020. Last day of radiation 06/14/2020. 2)  Consolidation immunotherapy with Imfinzi 1500 mg IV every 4 weeks.  First dose expected on 07/18/20.  Status post 3 cycles.  CURRENT THERAPY:  First-line systemic chemotherapy with carboplatin for AUC of 5, Abraxane 125 mg/M2 and Keytruda 200 mg IV every 3 weeks with Neulasta support.  1st dose January 16, 2021.  Status post 1 cycle.  He did not receive Abraxane with the first cycle.  INTERVAL HISTORY: George [name] 80 y.o. male returns to the clinic today for follow-up visit.  The patient is feeling fine today with no concerning complaints.  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  He tolerated the first cycle of his treatment well with no concerning adverse effects.  He denied having any current chest pain, shortness of breath, cough or hemoptysis.  He denied having any fever or chills.  He has no nausea, vomiting, diarrhea or constipation.  He has no headache or visual changes.  Is here today for evaluation before starting cycle  #2.  MEDICAL HISTORY: Past Medical History:  Diagnosis Date   Cancer [facility]) Prostate   Hypertension    Lung cancer ([place])     ALLERGIES:  is allergic to paclitaxel and aspirin.  MEDICATIONS:  Current Outpatient Medications  Medication Sig Dispense Refill   acetaminophen (TYLENOL) 500 MG tablet Take 500 mg by mouth every 6 (six) hours as needed for moderate pain.     albuterol (VENTOLIN HFA) 108 (90 Base) MCG/ACT inhaler INHALE 2 PUFFS BY MOUTH FOUR TIMES A DAY AS NEEDED     ascorbic acid (VITAMIN C) 500 MG tablet TAKE ONE TABLET BY MOUTH MONDAY, WEDNESDAY AND FRIDAY  WITH FERROUS GLUCONATE     benzonatate (TESSALON) 100 MG capsule Take 1 capsule by mouth 3 (three) times daily as needed.     Cholecalciferol 50 MCG (2000 UT) TABS Take 2 tablets by mouth daily.     ferrous gluconate (FERGON) 324 MG tablet TAKE ONE TABLET BY MOUTH MONDAY, WEDNESDAY AND FRIDAY     levothyroxine (SYNTHROID) 50 MCG tablet Take 1 tablet (50 mcg total) by mouth daily before breakfast. 30 tablet 1  Nutritional Supplements (ENSURE ACTIVE PO) TAKE 1 CAN BY MOUTH THREE TIMES A DAY     potassium chloride (MICRO-K) 10 MEQ CR capsule Take 1 capsule by mouth daily.     prochlorperazine (COMPAZINE) 10 MG tablet Take 1 tablet (10 mg total) by mouth every 6 (six) hours as needed. 30 tablet 2   sucralfate (CARAFATE) 1 g tablet Take 1 tablet (1 g total) by mouth 4 (four) times daily -  with meals and at bedtime. Crush and dissolve in 15 mL of warm water prior to swallowing 60 tablet 1   vitamin B-12 (CYANOCOBALAMIN) 500 MCG tablet Take 2 tablets by mouth daily.     No current facility-administered medications for this visit.    SURGICAL HISTORY: History reviewed. No pertinent surgical history.  REVIEW OF SYSTEMS:  Constitutional: positive for fatigue Eyes: negative Ears, nose, mouth, throat, and face: negative Respiratory: negative Cardiovascular: negative Gastrointestinal:  negative Genitourinary:negative Integument/breast: negative Hematologic/lymphatic: negative Musculoskeletal:negative Neurological: negative Behavioral/Psych: negative Endocrine: negative Allergic/Immunologic: negative   PHYSICAL EXAMINATION: General appearance: alert, cooperative, fatigued and no distress Head: Normocephalic, without obvious abnormality, atraumatic Neck: no adenopathy, no JVD, supple, symmetrical, trachea midline and thyroid not enlarged, symmetric, no tenderness/mass/nodules Lymph nodes: Cervical, supraclavicular, and axillary nodes normal. Resp: clear to auscultation bilaterally Back: symmetric, no curvature. ROM normal. No CVA tenderness. Cardio: regular rate and rhythm, S1, S2 normal, no murmur, click, rub or gallop GI: soft, non-tender; bowel sounds normal; no masses,  no organomegaly Extremities: extremities normal, atraumatic, no cyanosis or edema Neurologic: Alert and oriented X 3, normal strength and tone. Normal symmetric reflexes. Normal coordination and gait  ECOG PERFORMANCE STATUS: 1 - Symptomatic but completely ambulatory  Blood pressure 139/87, pulse 81, temperature 97.9 F (36.6 C), temperature source Tympanic, resp. rate 12, height 5' 6.5" (1.689 m), weight 96 lb (43.5 kg), SpO2 97 %.  LABORATORY DATA: Lab Results  Component Value Date   WBC 3.1 (L) 01/28/2021   HGB 12.8 (L) 01/28/2021   HCT 40.6 01/28/2021   MCV 86.6 01/28/2021   PLT 144 (L) 01/28/2021      Chemistry      Component Value Date/Time   NA 141 01/28/2021 1359   K 4.0 01/28/2021 1359   CL 101 01/28/2021 1359   CO2 30 01/28/2021 1359   BUN 10 01/28/2021 1359   CREATININE 1.15 01/28/2021 1359      Component Value Date/Time   CALCIUM 9.3 01/28/2021 1359   ALKPHOS 66 01/28/2021 1359   AST 19 01/28/2021 1359   ALT 18 01/28/2021 1359   BILITOT 0.5 01/28/2021 1359       RADIOGRAPHIC STUDIES: No results found.  ASSESSMENT AND PLAN: This is a very pleasant 80 years old  African-American male recently diagnosed with stage IIIc non-small cell lung cancer, squamous cell carcinoma presented with right upper lobe lung mass in addition to ipsilateral and contralateral hilar lymphadenopathy diagnosed in April 2021. Status post a course of concurrent chemoradiation with weekly carboplatin and paclitaxel for 6 cycles with partial response. The patient is currently undergoing consolidation treatment with immunotherapy with Imfinzi 1500 mg IV every 4 weeks status post 3 cycles.   The patient has been tolerating this treatment well with no concerning adverse effects. He had repeat CT scan of the chest performed recently.  I personally and independently reviewed the scan images and discussed the results with the patient today. Unfortunately his scan showed interval progression of the enhancing tumor within the anterior basal right upper lobe was  concern for progressive chest wall involvement.  He continues to have stable borderline enlarged right hilar and right paratracheal lymph nodes. He started systemic chemotherapy with a regimen of carboplatin and Keytruda 3 weeks ago.  He was supposed to receive paclitaxel but because of the hypersensitivity reaction he had in the past this was held.  He tolerated the first cycle of his treatment fairly well with no concerning adverse effects. I recommended for the patient to proceed with cycle #2 today but I will add Abraxane 125 mg/M2 initially on day one of the treatment but may consider adding day 8 later on depending on his tolerability. The patient will come back for follow-up visit in 3 weeks for evaluation before starting cycle #3. He was advised to call immediately if he has any concerning symptoms in the interval. The patient voices understanding of current disease status and treatment options and is in agreement with the current care plan. All questions were answered. The patient knows to call the clinic with any problems, questions  or concerns. We can certainly see the patient much sooner if necessary.  Disclaimer: This note was dictated with voice recognition software. Similar sounding words can inadvertently be transcribed and may not be corrected upon review.

## 2021-02-06 NOTE — Progress Notes (Signed)
Per MD, dosing Abraxane at 125 mg/m2 on day 1 of this first cycle. May adjust to 100 mg/m2 on days 1 and 8 for subsequent cycles pending patient tolerance. Abraxane only through cycle 4, will remove orders from cycles 5 and 6 for pembrolizumab monotherapy as maintenance starting with cycle 5.   Estrella Myrtle, PharmD, BCPS PGY-2 Hematology/Oncology Pharmacy Resident

## 2021-02-06 NOTE — Progress Notes (Signed)
Nutrition follow-up completed with patient during infusion for non-small cell lung cancer. Weight stable and documented as 96 pounds. Patient continues to drink Ensure Plus daily and is in need of an additional case today. He has no questions or concerns and denies nutrition impact symptoms.  Nutrition diagnosis: Unintended weight loss stable.  Intervention: Encouraged continued high-calorie high-protein foods and supplements. Provided complementary case of Ensure Plus.  Monitoring, evaluation, goals: Patient will tolerate adequate calories and protein for weight maintenance.  Next visit: To be scheduled as needed.  **Disclaimer: This note was dictated with voice recognition software. Similar sounding words can inadvertently be transcribed and this note may contain transcription errors which may not have been corrected upon publication of note.**

## 2021-02-06 NOTE — Patient Instructions (Signed)
[facility] Discharge Instructions for Patients Receiving Chemotherapy  Today you received the following chemotherapy agents Keytruda, abraxane, carbo  To help prevent nausea and vomiting after your treatment, we encourage you to take your nausea medication as directed.    If you develop nausea and vomiting that is not controlled by your nausea medication, call the clinic.   BELOW ARE SYMPTOMS THAT SHOULD BE REPORTED IMMEDIATELY:  *FEVER GREATER THAN 100.5 F  *CHILLS WITH OR WITHOUT FEVER  NAUSEA AND VOMITING THAT IS NOT CONTROLLED WITH YOUR NAUSEA MEDICATION  *UNUSUAL SHORTNESS OF BREATH  *UNUSUAL BRUISING OR BLEEDING  TENDERNESS IN MOUTH AND THROAT WITH OR WITHOUT PRESENCE OF ULCERS  *URINARY PROBLEMS  *BOWEL PROBLEMS  UNUSUAL RASH Items with * indicate a potential emergency and should be followed up as soon as possible.  Feel free to call the clinic should you have any questions or concerns. The clinic phone number is [phone].  Please show the [facility] at check-in to the Emergency Department and triage nurse.  Pembrolizumab injection What is this medicine? PEMBROLIZUMAB (pem broe liz ue mab) is a monoclonal antibody. It is used to treat certain types of cancer. This medicine may be used for other purposes; ask your health care provider or pharmacist if you have questions. COMMON BRAND NAME(S): Keytruda What should I tell my health care provider before I take this medicine? They need to know if you have any of these conditions:  autoimmune diseases like Crohn's disease, ulcerative colitis, or lupus  have had or planning to have an allogeneic stem cell transplant (uses someone else's stem cells)  history of organ transplant  history of chest radiation  nervous system problems like myasthenia gravis or Guillain-Barre syndrome  an unusual or allergic reaction to pembrolizumab, other medicines, foods, dyes, or preservatives  pregnant or  trying to get pregnant  breast-feeding How should I use this medicine? This medicine is for infusion into a vein. It is given by a health care professional in a hospital or clinic setting. A special MedGuide will be given to you before each treatment. Be sure to read this information carefully each time. Talk to your pediatrician regarding the use of this medicine in children. While this drug may be prescribed for children as young as 6 months for selected conditions, precautions do apply. Overdosage: If you think you have taken too much of this medicine contact a poison control center or emergency room at once. NOTE: This medicine is only for you. Do not share this medicine with others. What if I miss a dose? It is important not to miss your dose. Call your doctor or health care professional if you are unable to keep an appointment. What may interact with this medicine? Interactions have not been studied. This list may not describe all possible interactions. Give your health care provider a list of all the medicines, herbs, non-prescription drugs, or dietary supplements you use. Also tell them if you smoke, drink alcohol, or use illegal drugs. Some items may interact with your medicine. What should I watch for while using this medicine? Your condition will be monitored carefully while you are receiving this medicine. You may need blood work done while you are taking this medicine. Do not become pregnant while taking this medicine or for 4 months after stopping it. Women should inform their doctor if they wish to become pregnant or think they might be pregnant. There is a potential for serious side effects to an unborn  child. Talk to your health care professional or pharmacist for more information. Do not breast-feed an infant while taking this medicine or for 4 months after the last dose. What side effects may I notice from receiving this medicine? Side effects that you should report to your  doctor or health care professional as soon as possible:  allergic reactions like skin rash, itching or hives, swelling of the face, lips, or tongue  bloody or black, tarry  breathing problems  changes in vision  chest pain  chills  confusion  constipation  cough  diarrhea  dizziness or feeling faint or lightheaded  fast or irregular heartbeat  fever  flushing  joint pain  low blood counts - this medicine may decrease the number of white blood cells, red blood cells and platelets. You may be at increased risk for infections and bleeding.  muscle pain  muscle weakness  pain, tingling, numbness in the hands or feet  persistent headache  redness, blistering, peeling or loosening of the skin, including inside the mouth  signs and symptoms of high blood sugar such as dizziness; dry mouth; dry skin; fruity breath; nausea; stomach pain; increased hunger or thirst; increased urination  signs and symptoms of kidney injury like trouble passing urine or change in the amount of urine  signs and symptoms of liver injury like dark urine, light-colored stools, loss of appetite, nausea, right upper belly pain, yellowing of the eyes or skin  sweating  swollen lymph nodes  weight loss Side effects that usually do not require medical attention (report to your doctor or health care professional if they continue or are bothersome):  decreased appetite  hair loss  tiredness This list may not describe all possible side effects. Call your doctor for medical advice about side effects. You may report side effects to FDA at 1-800-FDA-1088. Where should I keep my medicine? This drug is given in a hospital or clinic and will not be stored at home. NOTE: This sheet is a summary. It may not cover all possible information. If you have questions about this medicine, talk to your doctor, pharmacist, or health care provider.  2021 Elsevier/Gold Standard (2019-10-25  21:44:53)  Nanoparticle Albumin-Bound Paclitaxel injection What is this medicine? NANOPARTICLE ALBUMIN-BOUND PACLITAXEL (Na no PAHR ti kuhl al BYOO muhn-bound PAK li TAX el) is a chemotherapy drug. It targets fast dividing cells, like cancer cells, and causes these cells to die. This medicine is used to treat advanced breast cancer, lung cancer, and pancreatic cancer. This medicine may be used for other purposes; ask your health care provider or pharmacist if you have questions. COMMON BRAND NAME(S): Abraxane What should I tell my health care provider before I take this medicine? They need to know if you have any of these conditions:  kidney disease  liver disease  low blood counts, like low white cell, platelet, or red cell counts  lung or breathing disease, like asthma  tingling of the fingers or toes, or other nerve disorder  an unusual or allergic reaction to paclitaxel, albumin, other chemotherapy, other medicines, foods, dyes, or preservatives  pregnant or trying to get pregnant  breast-feeding How should I use this medicine? This drug is given as an infusion into a vein. It is administered in a hospital or clinic by a specially trained health care professional. Talk to your pediatrician regarding the use of this medicine in children. Special care may be needed. Overdosage: If you think you have taken too much of this medicine contact  a poison control center or emergency room at once. NOTE: This medicine is only for you. Do not share this medicine with others. What if I miss a dose? It is important not to miss your dose. Call your doctor or health care professional if you are unable to keep an appointment. What may interact with this medicine? This medicine may interact with the following medications:  antiviral medicines for hepatitis, HIV or AIDS  certain antibiotics like erythromycin and clarithromycin  certain medicines for fungal infections like ketoconazole and  itraconazole  certain medicines for seizures like carbamazepine, phenobarbital, phenytoin  gemfibrozil  nefazodone  rifampin  St. John's wort This list may not describe all possible interactions. Give your health care provider a list of all the medicines, herbs, non-prescription drugs, or dietary supplements you use. Also tell them if you smoke, drink alcohol, or use illegal drugs. Some items may interact with your medicine. What should I watch for while using this medicine? Your condition will be monitored carefully while you are receiving this medicine. You will need important blood work done while you are taking this medicine. This medicine can cause serious allergic reactions. If you experience allergic reactions like skin rash, itching or hives, swelling of the face, lips, or tongue, tell your doctor or health care professional right away. In some cases, you may be given additional medicines to help with side effects. Follow all directions for their use. This drug may make you feel generally unwell. This is not uncommon, as chemotherapy can affect healthy cells as well as cancer cells. Report any side effects. Continue your course of treatment even though you feel ill unless your doctor tells you to stop. Call your doctor or health care professional for advice if you get a fever, chills or sore throat, or other symptoms of a cold or flu. Do not treat yourself. This drug decreases your body's ability to fight infections. Try to avoid being around people who are sick. This medicine may increase your risk to bruise or bleed. Call your doctor or health care professional if you notice any unusual bleeding. Be careful brushing and flossing your teeth or using a toothpick because you may get an infection or bleed more easily. If you have any dental work done, tell your dentist you are receiving this medicine. Avoid taking products that contain aspirin, acetaminophen, ibuprofen, naproxen, or  ketoprofen unless instructed by your doctor. These medicines may hide a fever. Do not become pregnant while taking this medicine or for 6 months after stopping it. Women should inform their doctor if they wish to become pregnant or think they might be pregnant. Men should not father a child while taking this medicine or for 3 months after stopping it. There is a potential for serious side effects to an unborn child. Talk to your health care professional or pharmacist for more information. Do not breast-feed an infant while taking this medicine or for 2 weeks after stopping it. This medicine may interfere with the ability to get pregnant or to father a child. You should talk to your doctor or health care professional if you are concerned about your fertility. What side effects may I notice from receiving this medicine? Side effects that you should report to your doctor or health care professional as soon as possible:  allergic reactions like skin rash, itching or hives, swelling of the face, lips, or tongue  breathing problems  changes in vision  fast, irregular heartbeat  low blood pressure  mouth sores  pain, tingling, numbness in the hands or feet  signs of decreased platelets or bleeding - bruising, pinpoint red spots on the skin, black, tarry stools, blood in the urine  signs of decreased red blood cells - unusually weak or tired, feeling faint or lightheaded, falls  signs of infection - fever or chills, cough, sore throat, pain or difficulty passing urine  signs and symptoms of liver injury like dark yellow or brown urine; general ill feeling or flu-like symptoms; light-colored stools; loss of appetite; nausea; right upper belly pain; unusually weak or tired; yellowing of the eyes or skin  swelling of the ankles, feet, hands  unusually slow heartbeat Side effects that usually do not require medical attention (report to your doctor or health care professional if they continue or  are bothersome):  diarrhea  hair loss  loss of appetite  nausea, vomiting  tiredness This list may not describe all possible side effects. Call your doctor for medical advice about side effects. You may report side effects to FDA at 1-800-FDA-1088. Where should I keep my medicine? This drug is given in a hospital or clinic and will not be stored at home. NOTE: This sheet is a summary. It may not cover all possible information. If you have questions about this medicine, talk to your doctor, pharmacist, or health care provider.  2021 Elsevier/Gold Standard (2017-07-27 13:03:45)  Carboplatin injection What is this medicine? CARBOPLATIN (KAR boe pla tin) is a chemotherapy drug. It targets fast dividing cells, like cancer cells, and causes these cells to die. This medicine is used to treat ovarian cancer and many other cancers. This medicine may be used for other purposes; ask your health care provider or pharmacist if you have questions. COMMON BRAND NAME(S): Paraplatin What should I tell my health care provider before I take this medicine? They need to know if you have any of these conditions:  blood disorders  hearing problems  kidney disease  recent or ongoing radiation therapy  an unusual or allergic reaction to carboplatin, cisplatin, other chemotherapy, other medicines, foods, dyes, or preservatives  pregnant or trying to get pregnant  breast-feeding How should I use this medicine? This drug is usually given as an infusion into a vein. It is administered in a hospital or clinic by a specially trained health care professional. Talk to your pediatrician regarding the use of this medicine in children. Special care may be needed. Overdosage: If you think you have taken too much of this medicine contact a poison control center or emergency room at once. NOTE: This medicine is only for you. Do not share this medicine with others. What if I miss a dose? It is important not to  miss a dose. Call your doctor or health care professional if you are unable to keep an appointment. What may interact with this medicine?  medicines for seizures  medicines to increase blood counts like filgrastim, pegfilgrastim, sargramostim  some antibiotics like amikacin, gentamicin, neomycin, streptomycin, tobramycin  vaccines Talk to your doctor or health care professional before taking any of these medicines:  acetaminophen  aspirin  ibuprofen  ketoprofen  naproxen This list may not describe all possible interactions. Give your health care provider a list of all the medicines, herbs, non-prescription drugs, or dietary supplements you use. Also tell them if you smoke, drink alcohol, or use illegal drugs. Some items may interact with your medicine. What should I watch for while using this medicine? Your condition will be monitored carefully while you are receiving  this medicine. You will need important blood work done while you are taking this medicine. This drug may make you feel generally unwell. This is not uncommon, as chemotherapy can affect healthy cells as well as cancer cells. Report any side effects. Continue your course of treatment even though you feel ill unless your doctor tells you to stop. In some cases, you may be given additional medicines to help with side effects. Follow all directions for their use. Call your doctor or health care professional for advice if you get a fever, chills or sore throat, or other symptoms of a cold or flu. Do not treat yourself. This drug decreases your body's ability to fight infections. Try to avoid being around people who are sick. This medicine may increase your risk to bruise or bleed. Call your doctor or health care professional if you notice any unusual bleeding. Be careful brushing and flossing your teeth or using a toothpick because you may get an infection or bleed more easily. If you have any dental work done, tell your dentist  you are receiving this medicine. Avoid taking products that contain aspirin, acetaminophen, ibuprofen, naproxen, or ketoprofen unless instructed by your doctor. These medicines may hide a fever. Do not become pregnant while taking this medicine. Women should inform their doctor if they wish to become pregnant or think they might be pregnant. There is a potential for serious side effects to an unborn child. Talk to your health care professional or pharmacist for more information. Do not breast-feed an infant while taking this medicine. What side effects may I notice from receiving this medicine? Side effects that you should report to your doctor or health care professional as soon as possible:  allergic reactions like skin rash, itching or hives, swelling of the face, lips, or tongue  signs of infection - fever or chills, cough, sore throat, pain or difficulty passing urine  signs of decreased platelets or bleeding - bruising, pinpoint red spots on the skin, black, tarry stools, nosebleeds  signs of decreased red blood cells - unusually weak or tired, fainting spells, lightheadedness  breathing problems  changes in hearing  changes in vision  chest pain  high blood pressure  low blood counts - This drug may decrease the number of white blood cells, red blood cells and platelets. You may be at increased risk for infections and bleeding.  nausea and vomiting  pain, swelling, redness or irritation at the injection site  pain, tingling, numbness in the hands or feet  problems with balance, talking, walking  trouble passing urine or change in the amount of urine Side effects that usually do not require medical attention (report to your doctor or health care professional if they continue or are bothersome):  hair loss  loss of appetite  metallic taste in the mouth or changes in taste This list may not describe all possible side effects. Call your doctor for medical advice about  side effects. You may report side effects to FDA at 1-800-FDA-1088. Where should I keep my medicine? This drug is given in a hospital or clinic and will not be stored at home. NOTE: This sheet is a summary. It may not cover all possible information. If you have questions about this medicine, talk to your doctor, pharmacist, or health care provider.  2021 Elsevier/Gold Standard (2008-02-28 14:38:05)

## 2021-02-06 NOTE — Progress Notes (Signed)
Per Dr Julien Nordmann , it is okay to treat pt today with Abraxane and ANC 1.3.

## 2021-02-06 NOTE — Patient Instructions (Signed)
Steps to Quit Smoking Smoking tobacco is the leading cause of preventable death. It can affect almost every organ in the body. Smoking puts you and people around you at risk for many serious, long-lasting (chronic) diseases. Quitting smoking can be hard, but it is one of the best things that you can do for your health. It is never too late to quit. How do I get ready to quit? When you decide to quit smoking, make a plan to help you succeed. Before you quit:  Pick a date to quit. Set a date within the next 2 weeks to give you time to prepare.  Write down the reasons why you are quitting. Keep this list in places where you will see it often.  Tell your family, friends, and co-workers that you are quitting. Their support is important.  Talk with your doctor about the choices that may help you quit.  Find out if your health insurance will pay for these treatments.  Know the people, places, things, and activities that make you want to smoke (triggers). Avoid them. What first steps can I take to quit smoking?  Throw away all cigarettes at home, at work, and in your car.  Throw away the things that you use when you smoke, such as ashtrays and lighters.  Clean your car. Make sure to empty the ashtray.  Clean your home, including curtains and carpets. What can I do to help me quit smoking? Talk with your doctor about taking medicines and seeing a counselor at the same time. You are more likely to succeed when you do both.  If you are pregnant or breastfeeding, talk with your doctor about counseling or other ways to quit smoking. Do not take medicine to help you quit smoking unless your doctor tells you to do so. To quit smoking: Quit right away  Quit smoking totally, instead of slowly cutting back on how much you smoke over a period of time.  Go to counseling. You are more likely to quit if you go to counseling sessions regularly. Take medicine You may take medicines to help you quit. Some  medicines need a prescription, and some you can buy over-the-counter. Some medicines may contain a drug called nicotine to replace the nicotine in cigarettes. Medicines may:  Help you to stop having the desire to smoke (cravings).  Help to stop the problems that come when you stop smoking (withdrawal symptoms). Your doctor may ask you to use:  Nicotine patches, gum, or lozenges.  Nicotine inhalers or sprays.  Non-nicotine medicine that is taken by mouth. Find resources Find resources and other ways to help you quit smoking and remain smoke-free after you quit. These resources are most helpful when you use them often. They include:  Online chats with a counselor.  Phone quitlines.  Printed self-help materials.  Support groups or group counseling.  Text messaging programs.  Mobile phone apps. Use apps on your mobile phone or tablet that can help you stick to your quit plan. There are many free apps for mobile phones and tablets as well as websites. Examples include Quit Guide from the CDC and smokefree.gov   What things can I do to make it easier to quit?  Talk to your family and friends. Ask them to support and encourage you.  Call a phone quitline (1-800-QUIT-NOW), reach out to support groups, or work with a counselor.  Ask people who smoke to not smoke around you.  Avoid places that make you want to smoke,   such as: ? Bars. ? Parties. ? Smoke-break areas at work.  Spend time with people who do not smoke.  Lower the stress in your life. Stress can make you want to smoke. Try these things to help your stress: ? Getting regular exercise. ? Doing deep-breathing exercises. ? Doing yoga. ? Meditating. ? Doing a body scan. To do this, close your eyes, focus on one area of your body at a time from head to toe. Notice which parts of your body are tense. Try to relax the muscles in those areas.   How will I feel when I quit smoking? Day 1 to 3 weeks Within the first 24 hours,  you may start to have some problems that come from quitting tobacco. These problems are very bad 2-3 days after you quit, but they do not often last for more than 2-3 weeks. You may get these symptoms:  Mood swings.  Feeling restless, nervous, angry, or annoyed.  Trouble concentrating.  Dizziness.  Strong desire for high-sugar foods and nicotine.  Weight gain.  Trouble pooping (constipation).  Feeling like you may vomit (nausea).  Coughing or a sore throat.  Changes in how the medicines that you take for other issues work in your body.  Depression.  Trouble sleeping (insomnia). Week 3 and afterward After the first 2-3 weeks of quitting, you may start to notice more positive results, such as:  Better sense of smell and taste.  Less coughing and sore throat.  Slower heart rate.  Lower blood pressure.  Clearer skin.  Better breathing.  Fewer sick days. Quitting smoking can be hard. Do not give up if you fail the first time. Some people need to try a few times before they succeed. Do your best to stick to your quit plan, and talk with your doctor if you have any questions or concerns. Summary  Smoking tobacco is the leading cause of preventable death. Quitting smoking can be hard, but it is one of the best things that you can do for your health.  When you decide to quit smoking, make a plan to help you succeed.  Quit smoking right away, not slowly over a period of time.  When you start quitting, seek help from your doctor, family, or friends. This information is not intended to replace advice given to you by your health care provider. Make sure you discuss any questions you have with your health care provider. Document Revised: 08/18/2019 Document Reviewed: 02/11/2019 Elsevier Patient Education  2021 Elsevier Inc.  

## 2021-02-07 ENCOUNTER — Telehealth: Payer: Self-pay | Admitting: Physician Assistant

## 2021-02-07 NOTE — Telephone Encounter (Signed)
Pt called back and advised he did pick up the rx and has been taking it.

## 2021-02-07 NOTE — Telephone Encounter (Signed)
I called the patient but was unable to reach him. I saw him on 01/28/21. The patient's TSH continues to be high. I started him in synthroid a few weeks ago. When I saw him recently, I had spoken to his niece, brother, and to the patient about starting this medication. When I saw him on 01/28/21 he had not started it yet. I was just calling him again to ensure that he picked this medication up and started taking it since his labs from yesterday continue to show a very elevated TSH. Left a voicemail with this information and encouraged him to return our call.

## 2021-02-08 ENCOUNTER — Inpatient Hospital Stay: Payer: No Typology Code available for payment source

## 2021-02-08 ENCOUNTER — Other Ambulatory Visit: Payer: Self-pay

## 2021-02-08 VITALS — BP 111/76 | HR 97 | Temp 97.3°F | Resp 19

## 2021-02-08 DIAGNOSIS — C3411 Malignant neoplasm of upper lobe, right bronchus or lung: Secondary | ICD-10-CM

## 2021-02-08 DIAGNOSIS — Z5112 Encounter for antineoplastic immunotherapy: Secondary | ICD-10-CM | POA: Diagnosis not present

## 2021-02-08 MED ORDER — PEGFILGRASTIM-BMEZ 6 MG/0.6ML ~~LOC~~ SOSY
6.0000 mg | PREFILLED_SYRINGE | Freq: Once | SUBCUTANEOUS | Status: AC
  Administered 2021-02-08: 6 mg via SUBCUTANEOUS

## 2021-02-08 NOTE — Patient Instructions (Signed)
Pegfilgrastim injection What is this medicine? PEGFILGRASTIM (PEG fil gra stim) is a long-acting granulocyte colony-stimulating factor that stimulates the growth of neutrophils, a type of white blood cell important in the body's fight against infection. It is used to reduce the incidence of fever and infection in patients with certain types of cancer who are receiving chemotherapy that affects the bone marrow, and to increase survival after being exposed to high doses of radiation. This medicine may be used for other purposes; ask your health care provider or pharmacist if you have questions. COMMON BRAND NAME(S): Fulphila, Neulasta, Nyvepria, UDENYCA, Ziextenzo What should I tell my health care provider before I take this medicine? They need to know if you have any of these conditions:  kidney disease  latex allergy  ongoing radiation therapy  sickle cell disease  skin reactions to acrylic adhesives (On-Body Injector only)  an unusual or allergic reaction to pegfilgrastim, filgrastim, other medicines, foods, dyes, or preservatives  pregnant or trying to get pregnant  breast-feeding How should I use this medicine? This medicine is for injection under the skin. If you get this medicine at home, you will be taught how to prepare and give the pre-filled syringe or how to use the On-body Injector. Refer to the patient Instructions for Use for detailed instructions. Use exactly as directed. Tell your healthcare provider immediately if you suspect that the On-body Injector may not have performed as intended or if you suspect the use of the On-body Injector resulted in a missed or partial dose. It is important that you put your used needles and syringes in a special sharps container. Do not put them in a trash can. If you do not have a sharps container, call your pharmacist or healthcare provider to get one. Talk to your pediatrician regarding the use of this medicine in children. While this drug  may be prescribed for selected conditions, precautions do apply. Overdosage: If you think you have taken too much of this medicine contact a poison control center or emergency room at once. NOTE: This medicine is only for you. Do not share this medicine with others. What if I miss a dose? It is important not to miss your dose. Call your doctor or health care professional if you miss your dose. If you miss a dose due to an On-body Injector failure or leakage, a new dose should be administered as soon as possible using a single prefilled syringe for manual use. What may interact with this medicine? Interactions have not been studied. This list may not describe all possible interactions. Give your health care provider a list of all the medicines, herbs, non-prescription drugs, or dietary supplements you use. Also tell them if you smoke, drink alcohol, or use illegal drugs. Some items may interact with your medicine. What should I watch for while using this medicine? Your condition will be monitored carefully while you are receiving this medicine. You may need blood work done while you are taking this medicine. Talk to your health care provider about your risk of cancer. You may be more at risk for certain types of cancer if you take this medicine. If you are going to need a MRI, CT scan, or other procedure, tell your doctor that you are using this medicine (On-Body Injector only). What side effects may I notice from receiving this medicine? Side effects that you should report to your doctor or health care professional as soon as possible:  allergic reactions (skin rash, itching or hives, swelling of   the face, lips, or tongue)  back pain  dizziness  fever  pain, redness, or irritation at site where injected  pinpoint red spots on the skin  red or dark-brown urine  shortness of breath or breathing problems  stomach or side pain, or pain at the shoulder  swelling  tiredness  trouble  passing urine or change in the amount of urine  unusual bruising or bleeding Side effects that usually do not require medical attention (report to your doctor or health care professional if they continue or are bothersome):  bone pain  muscle pain This list may not describe all possible side effects. Call your doctor for medical advice about side effects. You may report side effects to FDA at 1-800-FDA-1088. Where should I keep my medicine? Keep out of the reach of children. If you are using this medicine at home, you will be instructed on how to store it. Throw away any unused medicine after the expiration date on the label. NOTE: This sheet is a summary. It may not cover all possible information. If you have questions about this medicine, talk to your doctor, pharmacist, or health care provider.  2021 Elsevier/Gold Standard (2019-12-15 13:20:51)  

## 2021-02-10 ENCOUNTER — Telehealth: Payer: Self-pay | Admitting: Internal Medicine

## 2021-02-10 NOTE — Telephone Encounter (Signed)
Scheduled per los. Called and left msg. Mailed printout  °

## 2021-02-13 ENCOUNTER — Inpatient Hospital Stay: Payer: No Typology Code available for payment source

## 2021-02-13 ENCOUNTER — Other Ambulatory Visit: Payer: Self-pay

## 2021-02-13 DIAGNOSIS — Z5112 Encounter for antineoplastic immunotherapy: Secondary | ICD-10-CM | POA: Diagnosis not present

## 2021-02-13 DIAGNOSIS — C3411 Malignant neoplasm of upper lobe, right bronchus or lung: Secondary | ICD-10-CM

## 2021-02-13 LAB — CBC WITH DIFFERENTIAL (CANCER CENTER ONLY)
Abs Immature Granulocytes: 0.03 10*3/uL (ref 0.00–0.07)
Basophils Absolute: 0 10*3/uL (ref 0.0–0.1)
Basophils Relative: 1 %
Eosinophils Absolute: 0 10*3/uL (ref 0.0–0.5)
Eosinophils Relative: 0 %
HCT: 36 % — ABNORMAL LOW (ref 39.0–52.0)
Hemoglobin: 11.6 g/dL — ABNORMAL LOW (ref 13.0–17.0)
Immature Granulocytes: 1 %
Lymphocytes Relative: 17 %
Lymphs Abs: 0.6 10*3/uL — ABNORMAL LOW (ref 0.7–4.0)
MCH: 27.8 pg (ref 26.0–34.0)
MCHC: 32.2 g/dL (ref 30.0–36.0)
MCV: 86.3 fL (ref 80.0–100.0)
Monocytes Absolute: 0.5 10*3/uL (ref 0.1–1.0)
Monocytes Relative: 15 %
Neutro Abs: 2.4 10*3/uL (ref 1.7–7.7)
Neutrophils Relative %: 66 %
Platelet Count: 88 10*3/uL — ABNORMAL LOW (ref 150–400)
RBC: 4.17 MIL/uL — ABNORMAL LOW (ref 4.22–5.81)
RDW: 19.5 % — ABNORMAL HIGH (ref 11.5–15.5)
WBC Count: 3.6 10*3/uL — ABNORMAL LOW (ref 4.0–10.5)
nRBC: 0 % (ref 0.0–0.2)

## 2021-02-13 LAB — CMP (CANCER CENTER ONLY)
ALT: 9 U/L (ref 0–44)
AST: 11 U/L — ABNORMAL LOW (ref 15–41)
Albumin: 3.5 g/dL (ref 3.5–5.0)
Alkaline Phosphatase: 69 U/L (ref 38–126)
Anion gap: 8 (ref 5–15)
BUN: 13 mg/dL (ref 8–23)
CO2: 30 mmol/L (ref 22–32)
Calcium: 9.2 mg/dL (ref 8.9–10.3)
Chloride: 102 mmol/L (ref 98–111)
Creatinine: 1.02 mg/dL (ref 0.61–1.24)
GFR, Estimated: >60 mL/min (ref >60)
Glucose, Bld: 142 mg/dL — ABNORMAL HIGH (ref 70–99)
Potassium: 3.8 mmol/L (ref 3.5–5.1)
Sodium: 140 mmol/L (ref 135–145)
Total Bilirubin: 0.8 mg/dL (ref 0.3–1.2)
Total Protein: 7.6 g/dL (ref 6.5–8.1)

## 2021-02-20 ENCOUNTER — Other Ambulatory Visit: Payer: Self-pay

## 2021-02-20 ENCOUNTER — Inpatient Hospital Stay: Payer: No Typology Code available for payment source

## 2021-02-20 DIAGNOSIS — C3411 Malignant neoplasm of upper lobe, right bronchus or lung: Secondary | ICD-10-CM

## 2021-02-20 DIAGNOSIS — Z5112 Encounter for antineoplastic immunotherapy: Secondary | ICD-10-CM | POA: Diagnosis not present

## 2021-02-20 LAB — CBC WITH DIFFERENTIAL (CANCER CENTER ONLY)
Abs Immature Granulocytes: 0.31 10*3/uL — ABNORMAL HIGH (ref 0.00–0.07)
Basophils Absolute: 0 10*3/uL (ref 0.0–0.1)
Basophils Relative: 0 %
Eosinophils Absolute: 0 10*3/uL (ref 0.0–0.5)
Eosinophils Relative: 0 %
HCT: 37.2 % — ABNORMAL LOW (ref 39.0–52.0)
Hemoglobin: 11.9 g/dL — ABNORMAL LOW (ref 13.0–17.0)
Immature Granulocytes: 2 %
Lymphocytes Relative: 8 %
Lymphs Abs: 1.2 10*3/uL (ref 0.7–4.0)
MCH: 28.3 pg (ref 26.0–34.0)
MCHC: 32 g/dL (ref 30.0–36.0)
MCV: 88.6 fL (ref 80.0–100.0)
Monocytes Absolute: 0.8 10*3/uL (ref 0.1–1.0)
Monocytes Relative: 6 %
Neutro Abs: 12.7 10*3/uL — ABNORMAL HIGH (ref 1.7–7.7)
Neutrophils Relative %: 84 %
Platelet Count: 115 10*3/uL — ABNORMAL LOW (ref 150–400)
RBC: 4.2 MIL/uL — ABNORMAL LOW (ref 4.22–5.81)
RDW: 21.5 % — ABNORMAL HIGH (ref 11.5–15.5)
WBC Count: 15 10*3/uL — ABNORMAL HIGH (ref 4.0–10.5)
nRBC: 0 % (ref 0.0–0.2)

## 2021-02-20 LAB — CMP (CANCER CENTER ONLY)
ALT: 10 U/L (ref 0–44)
AST: 14 U/L — ABNORMAL LOW (ref 15–41)
Albumin: 3.5 g/dL (ref 3.5–5.0)
Alkaline Phosphatase: 96 U/L (ref 38–126)
Anion gap: 9 (ref 5–15)
BUN: 8 mg/dL (ref 8–23)
CO2: 31 mmol/L (ref 22–32)
Calcium: 8.4 mg/dL — ABNORMAL LOW (ref 8.9–10.3)
Chloride: 101 mmol/L (ref 98–111)
Creatinine: 0.92 mg/dL (ref 0.61–1.24)
GFR, Estimated: >60 mL/min (ref >60)
Glucose, Bld: 130 mg/dL — ABNORMAL HIGH (ref 70–99)
Potassium: 3.5 mmol/L (ref 3.5–5.1)
Sodium: 141 mmol/L (ref 135–145)
Total Bilirubin: 0.4 mg/dL (ref 0.3–1.2)
Total Protein: 7.5 g/dL (ref 6.5–8.1)

## 2021-02-27 ENCOUNTER — Other Ambulatory Visit: Payer: Self-pay

## 2021-02-27 ENCOUNTER — Inpatient Hospital Stay: Payer: No Typology Code available for payment source

## 2021-02-27 ENCOUNTER — Other Ambulatory Visit: Payer: Self-pay | Admitting: Internal Medicine

## 2021-02-27 ENCOUNTER — Inpatient Hospital Stay (HOSPITAL_BASED_OUTPATIENT_CLINIC_OR_DEPARTMENT_OTHER): Payer: No Typology Code available for payment source | Admitting: Internal Medicine

## 2021-02-27 VITALS — BP 134/81 | HR 86 | Temp 97.8°F | Resp 13 | Ht 66.5 in | Wt 97.4 lb

## 2021-02-27 DIAGNOSIS — C3490 Malignant neoplasm of unspecified part of unspecified bronchus or lung: Secondary | ICD-10-CM | POA: Diagnosis not present

## 2021-02-27 DIAGNOSIS — Z5112 Encounter for antineoplastic immunotherapy: Secondary | ICD-10-CM | POA: Diagnosis not present

## 2021-02-27 DIAGNOSIS — Z5111 Encounter for antineoplastic chemotherapy: Secondary | ICD-10-CM

## 2021-02-27 DIAGNOSIS — C3411 Malignant neoplasm of upper lobe, right bronchus or lung: Secondary | ICD-10-CM

## 2021-02-27 LAB — CBC WITH DIFFERENTIAL (CANCER CENTER ONLY)
Abs Immature Granulocytes: 0.16 10*3/uL — ABNORMAL HIGH (ref 0.00–0.07)
Basophils Absolute: 0 10*3/uL (ref 0.0–0.1)
Basophils Relative: 0 %
Eosinophils Absolute: 0 10*3/uL (ref 0.0–0.5)
Eosinophils Relative: 0 %
HCT: 36.4 % — ABNORMAL LOW (ref 39.0–52.0)
Hemoglobin: 11.8 g/dL — ABNORMAL LOW (ref 13.0–17.0)
Immature Granulocytes: 1 %
Lymphocytes Relative: 13 %
Lymphs Abs: 1.6 10*3/uL (ref 0.7–4.0)
MCH: 28.3 pg (ref 26.0–34.0)
MCHC: 32.4 g/dL (ref 30.0–36.0)
MCV: 87.3 fL (ref 80.0–100.0)
Monocytes Absolute: 0.9 10*3/uL (ref 0.1–1.0)
Monocytes Relative: 8 %
Neutro Abs: 9.2 10*3/uL — ABNORMAL HIGH (ref 1.7–7.7)
Neutrophils Relative %: 78 %
Platelet Count: 149 10*3/uL — ABNORMAL LOW (ref 150–400)
RBC: 4.17 MIL/uL — ABNORMAL LOW (ref 4.22–5.81)
RDW: 22.4 % — ABNORMAL HIGH (ref 11.5–15.5)
WBC Count: 11.9 10*3/uL — ABNORMAL HIGH (ref 4.0–10.5)
nRBC: 0 % (ref 0.0–0.2)

## 2021-02-27 LAB — CMP (CANCER CENTER ONLY)
ALT: 11 U/L (ref 0–44)
AST: 17 U/L (ref 15–41)
Albumin: 3.5 g/dL (ref 3.5–5.0)
Alkaline Phosphatase: 85 U/L (ref 38–126)
Anion gap: 12 (ref 5–15)
BUN: 10 mg/dL (ref 8–23)
CO2: 28 mmol/L (ref 22–32)
Calcium: 8.9 mg/dL (ref 8.9–10.3)
Chloride: 103 mmol/L (ref 98–111)
Creatinine: 0.94 mg/dL (ref 0.61–1.24)
GFR, Estimated: >60 mL/min (ref >60)
Glucose, Bld: 134 mg/dL — ABNORMAL HIGH (ref 70–99)
Potassium: 3.6 mmol/L (ref 3.5–5.1)
Sodium: 143 mmol/L (ref 135–145)
Total Bilirubin: 0.5 mg/dL (ref 0.3–1.2)
Total Protein: 7.6 g/dL (ref 6.5–8.1)

## 2021-02-27 MED ORDER — PALONOSETRON HCL INJECTION 0.25 MG/5ML
INTRAVENOUS | Status: AC
  Filled 2021-02-27: qty 5

## 2021-02-27 MED ORDER — SODIUM CHLORIDE 0.9 % IV SOLN
Freq: Once | INTRAVENOUS | Status: AC
  Filled 2021-02-27: qty 250

## 2021-02-27 MED ORDER — SODIUM CHLORIDE 0.9 % IV SOLN
150.0000 mg | Freq: Once | INTRAVENOUS | Status: AC
  Administered 2021-02-27: 150 mg via INTRAVENOUS
  Filled 2021-02-27: qty 150

## 2021-02-27 MED ORDER — PACLITAXEL PROTEIN-BOUND CHEMO INJECTION 100 MG
125.0000 mg/m2 | Freq: Once | INTRAVENOUS | Status: AC
  Administered 2021-02-27: 175 mg via INTRAVENOUS
  Filled 2021-02-27: qty 35

## 2021-02-27 MED ORDER — SODIUM CHLORIDE 0.9 % IV SOLN
200.0000 mg | Freq: Once | INTRAVENOUS | Status: AC
  Administered 2021-02-27: 200 mg via INTRAVENOUS
  Filled 2021-02-27: qty 8

## 2021-02-27 MED ORDER — PALONOSETRON HCL INJECTION 0.25 MG/5ML
0.2500 mg | Freq: Once | INTRAVENOUS | Status: AC
  Administered 2021-02-27: 0.25 mg via INTRAVENOUS

## 2021-02-27 MED ORDER — SODIUM CHLORIDE 0.9 % IV SOLN
10.0000 mg | Freq: Once | INTRAVENOUS | Status: AC
  Administered 2021-02-27: 10 mg via INTRAVENOUS
  Filled 2021-02-27: qty 10

## 2021-02-27 MED ORDER — SODIUM CHLORIDE 0.9 % IV SOLN
233.6000 mg | Freq: Once | INTRAVENOUS | Status: AC
  Administered 2021-02-27: 230 mg via INTRAVENOUS
  Filled 2021-02-27: qty 23

## 2021-02-27 NOTE — Progress Notes (Signed)
[place] [phone]   Fax:([phone]) [place] [address]  DIAGNOSIS: Stage IIIC non-small cell lung cancer, squamous cell carcinoma.  He presented with a right upper lobe mass and precardial lymphadenopathy and ipsilateral and contralateral hilar lymphadenopathy.  He was diagnosed in April 2021  PRIOR THERAPY: 1) Weekly concurrent chemoradiation with carboplatin for an AUC of 2 and paclitaxel 45 mg per metered squared.Status post6cycles.Taxol discontinued from treatment plan starting from cycle #3 due to adverse reaction.Last dose of chemotherapy 06/11/2020. Last day of radiation 06/14/2020. 2)  Consolidation immunotherapy with Imfinzi 1500 mg IV every 4 weeks.  First dose expected on 07/18/20.  Status post 3 cycles.  CURRENT THERAPY:  First-line systemic chemotherapy with carboplatin for AUC of 5, Abraxane 125 mg/M2 and Keytruda 200 mg IV every 3 weeks with Neulasta support.  1st dose January 16, 2021.  Status post 2 cycle.  He did not receive Abraxane with the first cycle.  INTERVAL HISTORY: George [name] 80 y.o. male returns to the clinic today for follow-up visit.  The patient is feeling fine today with no concerning complaints except for pain on the right side of the chest after the Neulasta injection.  He denied having any current chest pain, shortness of breath, cough or hemoptysis.  He denied having any fever or chills.  He has no nausea, vomiting, diarrhea or constipation.  He has no headache or visual changes.  He denied having any significant weight loss or night sweats.  He is here today for evaluation before starting cycle #3 of his treatment.  MEDICAL HISTORY: Past Medical History:  Diagnosis Date  . Cancer [facility]) Prostate  . Hypertension   . Lung cancer ([place])     ALLERGIES:  is allergic to paclitaxel and aspirin.  MEDICATIONS:  Current  Outpatient Medications  Medication Sig Dispense Refill  . acetaminophen (TYLENOL) 500 MG tablet Take 500 mg by mouth every 6 (six) hours as needed for moderate pain.    Marland Kitchen albuterol (VENTOLIN HFA) 108 (90 Base) MCG/ACT inhaler INHALE 2 PUFFS BY MOUTH FOUR TIMES A DAY AS NEEDED    . ascorbic acid (VITAMIN C) 500 MG tablet TAKE ONE TABLET BY MOUTH MONDAY, WEDNESDAY AND FRIDAY  WITH FERROUS GLUCONATE    . benzonatate (TESSALON) 100 MG capsule Take 1 capsule by mouth 3 (three) times daily as needed.    . Cholecalciferol 50 MCG (2000 UT) TABS Take 2 tablets by mouth daily.    . ferrous gluconate (FERGON) 324 MG tablet TAKE ONE TABLET BY MOUTH MONDAY, WEDNESDAY AND FRIDAY    . levothyroxine (SYNTHROID) 50 MCG tablet Take 1 tablet (50 mcg total) by mouth daily before breakfast. 30 tablet 1  . Nutritional Supplements (ENSURE ACTIVE PO) TAKE 1 CAN BY MOUTH THREE TIMES A DAY    . potassium chloride (MICRO-K) 10 MEQ CR capsule Take 1 capsule by mouth daily.    . prochlorperazine (COMPAZINE) 10 MG tablet Take 1 tablet (10 mg total) by mouth every 6 (six) hours as needed. 30 tablet 2  . sucralfate (CARAFATE) 1 g tablet Take 1 tablet (1 g total) by mouth 4 (four) times daily -  with meals and at bedtime. Crush and dissolve in 15 mL of warm water prior to swallowing 60 tablet 1  . vitamin B-12 (CYANOCOBALAMIN) 500 MCG tablet Take 2 tablets by mouth daily.  No current facility-administered medications for this visit.    SURGICAL HISTORY: No past surgical history on file.  REVIEW OF SYSTEMS:  A comprehensive review of systems was negative except for: Constitutional: positive for fatigue   PHYSICAL EXAMINATION: General appearance: alert, cooperative, fatigued and no distress Head: Normocephalic, without obvious abnormality, atraumatic Neck: no adenopathy, no JVD, supple, symmetrical, trachea midline and thyroid not enlarged, symmetric, no tenderness/mass/nodules Lymph nodes: Cervical, supraclavicular, and  axillary nodes normal. Resp: clear to auscultation bilaterally Back: symmetric, no curvature. ROM normal. No CVA tenderness. Cardio: regular rate and rhythm, S1, S2 normal, no murmur, click, rub or gallop GI: soft, non-tender; bowel sounds normal; no masses,  no organomegaly Extremities: extremities normal, atraumatic, no cyanosis or edema  ECOG PERFORMANCE STATUS: 1 - Symptomatic but completely ambulatory  Blood pressure 134/81, pulse 86, temperature 97.8 F (36.6 C), temperature source Tympanic, resp. rate 13, height 5' 6.5" (1.689 m), weight 97 lb 6.4 oz (44.2 kg), SpO2 95 %.  LABORATORY DATA: Lab Results  Component Value Date   WBC 15.0 (H) 02/20/2021   HGB 11.9 (L) 02/20/2021   HCT 37.2 (L) 02/20/2021   MCV 88.6 02/20/2021   PLT 115 (L) 02/20/2021      Chemistry      Component Value Date/Time   NA 141 02/20/2021 1204   K 3.5 02/20/2021 1204   CL 101 02/20/2021 1204   CO2 31 02/20/2021 1204   BUN 8 02/20/2021 1204   CREATININE 0.92 02/20/2021 1204      Component Value Date/Time   CALCIUM 8.4 (L) 02/20/2021 1204   ALKPHOS 96 02/20/2021 1204   AST 14 (L) 02/20/2021 1204   ALT 10 02/20/2021 1204   BILITOT 0.4 02/20/2021 1204       RADIOGRAPHIC STUDIES: No results found.  ASSESSMENT AND PLAN: This is a very pleasant 80 years old African-American male recently diagnosed with stage IIIc non-small cell lung cancer, squamous cell carcinoma presented with right upper lobe lung mass in addition to ipsilateral and contralateral hilar lymphadenopathy diagnosed in April 2021. Status post a course of concurrent chemoradiation with weekly carboplatin and paclitaxel for 6 cycles with partial response. The patient is currently undergoing consolidation treatment with immunotherapy with Imfinzi 1500 mg IV every 4 weeks status post 3 cycles.   The patient has been tolerating this treatment well with no concerning adverse effects. He had repeat CT scan of the chest performed recently.   I personally and independently reviewed the scan images and discussed the results with the patient today. Unfortunately his scan showed interval progression of the enhancing tumor within the anterior basal right upper lobe was concern for progressive chest wall involvement.  He continues to have stable borderline enlarged right hilar and right paratracheal lymph nodes. He started systemic chemotherapy with a regimen of carboplatin and Keytruda 3 weeks ago.  He was supposed to receive paclitaxel but because of the hypersensitivity reaction he had in the past this was held.  He tolerated the first cycle of his treatment fairly well with no concerning adverse effects. I recommended for the patient to proceed with cycle #2 today but I will add Abraxane 125 mg/M2 initially on day one of the treatment but may consider adding day 8 later on depending on his tolerability. The patient is tolerating his treatment well with no concerning adverse effect except for mild right-sided chest pain after the Neulasta injection. I recommended for him to proceed with cycle #3 today as planned. I will see him back  for follow-up visit in 3 weeks for evaluation with repeat CT scan of the chest, abdomen pelvis for restaging of his disease. The patient was advised to call immediately if he has any concerning symptoms in the interval. The patient voices understanding of current disease status and treatment options and is in agreement with the current care plan. All questions were answered. The patient knows to call the clinic with any problems, questions or concerns. We can certainly see the patient much sooner if necessary.  Disclaimer: This note was dictated with voice recognition software. Similar sounding words can inadvertently be transcribed and may not be corrected upon review.

## 2021-02-27 NOTE — Patient Instructions (Signed)
[place] Discharge Instructions for Patients Receiving Chemotherapy  Today you received the following chemotherapy agents keytruda. Abraxane, carboplatin  To help prevent nausea and vomiting after your treatment, we encourage you to take your nausea medication as directed   If you develop nausea and vomiting that is not controlled by your nausea medication, call the clinic.   BELOW ARE SYMPTOMS THAT SHOULD BE REPORTED IMMEDIATELY:  *FEVER GREATER THAN 100.5 F  *CHILLS WITH OR WITHOUT FEVER  NAUSEA AND VOMITING THAT IS NOT CONTROLLED WITH YOUR NAUSEA MEDICATION  *UNUSUAL SHORTNESS OF BREATH  *UNUSUAL BRUISING OR BLEEDING  TENDERNESS IN MOUTH AND THROAT WITH OR WITHOUT PRESENCE OF ULCERS  *URINARY PROBLEMS  *BOWEL PROBLEMS  UNUSUAL RASH Items with * indicate a potential emergency and should be followed up as soon as possible.  Feel free to call the clinic should you have any questions or concerns. The clinic phone number is [phone].  Please show the [facility] at check-in to the Emergency Department and triage nurse.

## 2021-03-01 ENCOUNTER — Inpatient Hospital Stay: Payer: No Typology Code available for payment source

## 2021-03-01 ENCOUNTER — Other Ambulatory Visit: Payer: Self-pay

## 2021-03-01 VITALS — BP 108/67 | HR 108 | Temp 96.5°F | Resp 16

## 2021-03-01 DIAGNOSIS — C3411 Malignant neoplasm of upper lobe, right bronchus or lung: Secondary | ICD-10-CM

## 2021-03-01 DIAGNOSIS — Z5112 Encounter for antineoplastic immunotherapy: Secondary | ICD-10-CM | POA: Diagnosis not present

## 2021-03-01 MED ORDER — PEGFILGRASTIM-BMEZ 6 MG/0.6ML ~~LOC~~ SOSY
6.0000 mg | PREFILLED_SYRINGE | Freq: Once | SUBCUTANEOUS | Status: AC
  Administered 2021-03-01: 6 mg via SUBCUTANEOUS

## 2021-03-01 NOTE — Patient Instructions (Signed)
Pegfilgrastim injection What is this medicine? PEGFILGRASTIM (PEG fil gra stim) is a long-acting granulocyte colony-stimulating factor that stimulates the growth of neutrophils, a type of white blood cell important in the body's fight against infection. It is used to reduce the incidence of fever and infection in patients with certain types of cancer who are receiving chemotherapy that affects the bone marrow, and to increase survival after being exposed to high doses of radiation. This medicine may be used for other purposes; ask your health care provider or pharmacist if you have questions. COMMON BRAND NAME(S): Fulphila, Neulasta, Nyvepria, UDENYCA, Ziextenzo What should I tell my health care provider before I take this medicine? They need to know if you have any of these conditions:  kidney disease  latex allergy  ongoing radiation therapy  sickle cell disease  skin reactions to acrylic adhesives (On-Body Injector only)  an unusual or allergic reaction to pegfilgrastim, filgrastim, other medicines, foods, dyes, or preservatives  pregnant or trying to get pregnant  breast-feeding How should I use this medicine? This medicine is for injection under the skin. If you get this medicine at home, you will be taught how to prepare and give the pre-filled syringe or how to use the On-body Injector. Refer to the patient Instructions for Use for detailed instructions. Use exactly as directed. Tell your healthcare provider immediately if you suspect that the On-body Injector may not have performed as intended or if you suspect the use of the On-body Injector resulted in a missed or partial dose. It is important that you put your used needles and syringes in a special sharps container. Do not put them in a trash can. If you do not have a sharps container, call your pharmacist or healthcare provider to get one. Talk to your pediatrician regarding the use of this medicine in children. While this drug  may be prescribed for selected conditions, precautions do apply. Overdosage: If you think you have taken too much of this medicine contact a poison control center or emergency room at once. NOTE: This medicine is only for you. Do not share this medicine with others. What if I miss a dose? It is important not to miss your dose. Call your doctor or health care professional if you miss your dose. If you miss a dose due to an On-body Injector failure or leakage, a new dose should be administered as soon as possible using a single prefilled syringe for manual use. What may interact with this medicine? Interactions have not been studied. This list may not describe all possible interactions. Give your health care provider a list of all the medicines, herbs, non-prescription drugs, or dietary supplements you use. Also tell them if you smoke, drink alcohol, or use illegal drugs. Some items may interact with your medicine. What should I watch for while using this medicine? Your condition will be monitored carefully while you are receiving this medicine. You may need blood work done while you are taking this medicine. Talk to your health care provider about your risk of cancer. You may be more at risk for certain types of cancer if you take this medicine. If you are going to need a MRI, CT scan, or other procedure, tell your doctor that you are using this medicine (On-Body Injector only). What side effects may I notice from receiving this medicine? Side effects that you should report to your doctor or health care professional as soon as possible:  allergic reactions (skin rash, itching or hives, swelling of   the face, lips, or tongue)  back pain  dizziness  fever  pain, redness, or irritation at site where injected  pinpoint red spots on the skin  red or dark-brown urine  shortness of breath or breathing problems  stomach or side pain, or pain at the shoulder  swelling  tiredness  trouble  passing urine or change in the amount of urine  unusual bruising or bleeding Side effects that usually do not require medical attention (report to your doctor or health care professional if they continue or are bothersome):  bone pain  muscle pain This list may not describe all possible side effects. Call your doctor for medical advice about side effects. You may report side effects to FDA at 1-800-FDA-1088. Where should I keep my medicine? Keep out of the reach of children. If you are using this medicine at home, you will be instructed on how to store it. Throw away any unused medicine after the expiration date on the label. NOTE: This sheet is a summary. It may not cover all possible information. If you have questions about this medicine, talk to your doctor, pharmacist, or health care provider.  2021 Elsevier/Gold Standard (2019-12-15 13:20:51)  

## 2021-03-06 ENCOUNTER — Other Ambulatory Visit: Payer: Self-pay

## 2021-03-06 ENCOUNTER — Other Ambulatory Visit: Payer: Self-pay | Admitting: Physician Assistant

## 2021-03-06 ENCOUNTER — Inpatient Hospital Stay: Payer: No Typology Code available for payment source

## 2021-03-06 DIAGNOSIS — Z5112 Encounter for antineoplastic immunotherapy: Secondary | ICD-10-CM | POA: Diagnosis not present

## 2021-03-06 DIAGNOSIS — E039 Hypothyroidism, unspecified: Secondary | ICD-10-CM

## 2021-03-06 DIAGNOSIS — C3411 Malignant neoplasm of upper lobe, right bronchus or lung: Secondary | ICD-10-CM

## 2021-03-06 LAB — CMP (CANCER CENTER ONLY)
ALT: 9 U/L (ref 0–44)
AST: 11 U/L — ABNORMAL LOW (ref 15–41)
Albumin: 3.6 g/dL (ref 3.5–5.0)
Alkaline Phosphatase: 90 U/L (ref 38–126)
Anion gap: 11 (ref 5–15)
BUN: 11 mg/dL (ref 8–23)
CO2: 31 mmol/L (ref 22–32)
Calcium: 8.7 mg/dL — ABNORMAL LOW (ref 8.9–10.3)
Chloride: 102 mmol/L (ref 98–111)
Creatinine: 0.97 mg/dL (ref 0.61–1.24)
GFR, Estimated: >60 mL/min (ref >60)
Glucose, Bld: 116 mg/dL — ABNORMAL HIGH (ref 70–99)
Potassium: 4.1 mmol/L (ref 3.5–5.1)
Sodium: 144 mmol/L (ref 135–145)
Total Bilirubin: 0.9 mg/dL (ref 0.3–1.2)
Total Protein: 7.2 g/dL (ref 6.5–8.1)

## 2021-03-06 LAB — CBC WITH DIFFERENTIAL (CANCER CENTER ONLY)
Abs Immature Granulocytes: 0.28 10*3/uL — ABNORMAL HIGH (ref 0.00–0.07)
Basophils Absolute: 0.1 10*3/uL (ref 0.0–0.1)
Basophils Relative: 1 %
Eosinophils Absolute: 0 10*3/uL (ref 0.0–0.5)
Eosinophils Relative: 0 %
HCT: 34.4 % — ABNORMAL LOW (ref 39.0–52.0)
Hemoglobin: 10.9 g/dL — ABNORMAL LOW (ref 13.0–17.0)
Immature Granulocytes: 3 %
Lymphocytes Relative: 10 %
Lymphs Abs: 0.9 10*3/uL (ref 0.7–4.0)
MCH: 28.8 pg (ref 26.0–34.0)
MCHC: 31.7 g/dL (ref 30.0–36.0)
MCV: 91 fL (ref 80.0–100.0)
Monocytes Absolute: 1.3 10*3/uL — ABNORMAL HIGH (ref 0.1–1.0)
Monocytes Relative: 14 %
Neutro Abs: 6.6 10*3/uL (ref 1.7–7.7)
Neutrophils Relative %: 72 %
Platelet Count: 120 10*3/uL — ABNORMAL LOW (ref 150–400)
RBC: 3.78 MIL/uL — ABNORMAL LOW (ref 4.22–5.81)
RDW: 22.3 % — ABNORMAL HIGH (ref 11.5–15.5)
WBC Count: 9.2 10*3/uL (ref 4.0–10.5)
nRBC: 0 % (ref 0.0–0.2)

## 2021-03-06 LAB — TSH: TSH: 52.521 u[IU]/mL — ABNORMAL HIGH (ref 0.320–4.118)

## 2021-03-06 MED ORDER — LEVOTHYROXINE SODIUM 75 MCG PO TABS: 75.0000 ug | ORAL_TABLET | Freq: Every day | ORAL | 1 refills | Status: DC

## 2021-03-07 ENCOUNTER — Telehealth: Payer: Self-pay | Admitting: Physician Assistant

## 2021-03-07 NOTE — Telephone Encounter (Signed)
I called the patient and let him know that his TSH is still pretty elevated. He reports that he is taking his synthroid. I let him know I increased his dose from 50 mcg to 75 mcg. I instructed him to pick this up and start taking the new dose. He expressed understanding.

## 2021-03-13 ENCOUNTER — Other Ambulatory Visit: Payer: Self-pay

## 2021-03-13 ENCOUNTER — Inpatient Hospital Stay: Payer: No Typology Code available for payment source | Attending: Radiation Oncology

## 2021-03-13 DIAGNOSIS — I10 Essential (primary) hypertension: Secondary | ICD-10-CM | POA: Diagnosis not present

## 2021-03-13 DIAGNOSIS — Z923 Personal history of irradiation: Secondary | ICD-10-CM | POA: Insufficient documentation

## 2021-03-13 DIAGNOSIS — Z5111 Encounter for antineoplastic chemotherapy: Secondary | ICD-10-CM | POA: Diagnosis present

## 2021-03-13 DIAGNOSIS — Z5189 Encounter for other specified aftercare: Secondary | ICD-10-CM | POA: Diagnosis not present

## 2021-03-13 DIAGNOSIS — Z9221 Personal history of antineoplastic chemotherapy: Secondary | ICD-10-CM | POA: Insufficient documentation

## 2021-03-13 DIAGNOSIS — C3411 Malignant neoplasm of upper lobe, right bronchus or lung: Secondary | ICD-10-CM | POA: Diagnosis present

## 2021-03-13 DIAGNOSIS — Z5112 Encounter for antineoplastic immunotherapy: Secondary | ICD-10-CM | POA: Diagnosis not present

## 2021-03-13 DIAGNOSIS — Z79899 Other long term (current) drug therapy: Secondary | ICD-10-CM | POA: Diagnosis not present

## 2021-03-13 LAB — CMP (CANCER CENTER ONLY)
ALT: 7 U/L (ref 0–44)
AST: 13 U/L — ABNORMAL LOW (ref 15–41)
Albumin: 3.5 g/dL (ref 3.5–5.0)
Alkaline Phosphatase: 114 U/L (ref 38–126)
Anion gap: 11 (ref 5–15)
BUN: 7 mg/dL — ABNORMAL LOW (ref 8–23)
CO2: 30 mmol/L (ref 22–32)
Calcium: 8.2 mg/dL — ABNORMAL LOW (ref 8.9–10.3)
Chloride: 102 mmol/L (ref 98–111)
Creatinine: 1.08 mg/dL (ref 0.61–1.24)
GFR, Estimated: >60 mL/min (ref >60)
Glucose, Bld: 139 mg/dL — ABNORMAL HIGH (ref 70–99)
Potassium: 4.2 mmol/L (ref 3.5–5.1)
Sodium: 143 mmol/L (ref 135–145)
Total Bilirubin: 0.5 mg/dL (ref 0.3–1.2)
Total Protein: 7.2 g/dL (ref 6.5–8.1)

## 2021-03-13 LAB — CBC WITH DIFFERENTIAL (CANCER CENTER ONLY)
Abs Immature Granulocytes: 0.3 10*3/uL — ABNORMAL HIGH (ref 0.00–0.07)
Basophils Absolute: 0 10*3/uL (ref 0.0–0.1)
Basophils Relative: 0 %
Eosinophils Absolute: 0 10*3/uL (ref 0.0–0.5)
Eosinophils Relative: 0 %
HCT: 36.6 % — ABNORMAL LOW (ref 39.0–52.0)
Hemoglobin: 11.4 g/dL — ABNORMAL LOW (ref 13.0–17.0)
Immature Granulocytes: 1 %
Lymphocytes Relative: 6 %
Lymphs Abs: 1.3 10*3/uL (ref 0.7–4.0)
MCH: 28.9 pg (ref 26.0–34.0)
MCHC: 31.1 g/dL (ref 30.0–36.0)
MCV: 92.7 fL (ref 80.0–100.0)
Monocytes Absolute: 0.8 10*3/uL (ref 0.1–1.0)
Monocytes Relative: 4 %
Neutro Abs: 20.3 10*3/uL — ABNORMAL HIGH (ref 1.7–7.7)
Neutrophils Relative %: 89 %
Platelet Count: 154 10*3/uL (ref 150–400)
RBC: 3.95 MIL/uL — ABNORMAL LOW (ref 4.22–5.81)
RDW: 23.6 % — ABNORMAL HIGH (ref 11.5–15.5)
WBC Count: 22.7 10*3/uL — ABNORMAL HIGH (ref 4.0–10.5)
nRBC: 0 % (ref 0.0–0.2)

## 2021-03-18 ENCOUNTER — Other Ambulatory Visit: Payer: Self-pay

## 2021-03-18 ENCOUNTER — Ambulatory Visit (HOSPITAL_COMMUNITY)
Admission: RE | Admit: 2021-03-18 | Discharge: 2021-03-18 | Disposition: A | Discharge status: Home / Self Care | Payer: No Typology Code available for payment source | Source: Ambulatory Visit | Attending: Internal Medicine | Admitting: Internal Medicine

## 2021-03-18 DIAGNOSIS — C3490 Malignant neoplasm of unspecified part of unspecified bronchus or lung: Secondary | ICD-10-CM | POA: Diagnosis not present

## 2021-03-18 MED ORDER — IOHEXOL 300 MG/ML  SOLN
100.0000 mL | Freq: Once | INTRAMUSCULAR | Status: AC | PRN
  Administered 2021-03-18: 100 mL via INTRAVENOUS

## 2021-03-20 ENCOUNTER — Other Ambulatory Visit: Payer: Self-pay

## 2021-03-20 ENCOUNTER — Encounter: Payer: Self-pay | Admitting: Internal Medicine

## 2021-03-20 ENCOUNTER — Inpatient Hospital Stay: Payer: No Typology Code available for payment source

## 2021-03-20 ENCOUNTER — Inpatient Hospital Stay (HOSPITAL_BASED_OUTPATIENT_CLINIC_OR_DEPARTMENT_OTHER): Payer: No Typology Code available for payment source | Admitting: Internal Medicine

## 2021-03-20 VITALS — BP 127/81 | HR 98 | Temp 96.6°F | Resp 20 | Ht 66.0 in | Wt 98.0 lb

## 2021-03-20 DIAGNOSIS — C3411 Malignant neoplasm of upper lobe, right bronchus or lung: Secondary | ICD-10-CM | POA: Diagnosis not present

## 2021-03-20 DIAGNOSIS — Z5112 Encounter for antineoplastic immunotherapy: Secondary | ICD-10-CM | POA: Diagnosis not present

## 2021-03-20 LAB — CMP (CANCER CENTER ONLY)
ALT: <6 U/L (ref 0–44)
AST: 14 U/L — ABNORMAL LOW (ref 15–41)
Albumin: 3.5 g/dL (ref 3.5–5.0)
Alkaline Phosphatase: 101 U/L (ref 38–126)
Anion gap: 13 (ref 5–15)
BUN: 9 mg/dL (ref 8–23)
CO2: 29 mmol/L (ref 22–32)
Calcium: 8.9 mg/dL (ref 8.9–10.3)
Chloride: 102 mmol/L (ref 98–111)
Creatinine: 0.91 mg/dL (ref 0.61–1.24)
GFR, Estimated: >60 mL/min (ref >60)
Glucose, Bld: 119 mg/dL — ABNORMAL HIGH (ref 70–99)
Potassium: 3.5 mmol/L (ref 3.5–5.1)
Sodium: 144 mmol/L (ref 135–145)
Total Bilirubin: 0.4 mg/dL (ref 0.3–1.2)
Total Protein: 7.4 g/dL (ref 6.5–8.1)

## 2021-03-20 LAB — CBC WITH DIFFERENTIAL (CANCER CENTER ONLY)
Abs Immature Granulocytes: 0.06 10*3/uL (ref 0.00–0.07)
Basophils Absolute: 0 10*3/uL (ref 0.0–0.1)
Basophils Relative: 0 %
Eosinophils Absolute: 0 10*3/uL (ref 0.0–0.5)
Eosinophils Relative: 0 %
HCT: 37.5 % — ABNORMAL LOW (ref 39.0–52.0)
Hemoglobin: 11.6 g/dL — ABNORMAL LOW (ref 13.0–17.0)
Immature Granulocytes: 1 %
Lymphocytes Relative: 9 %
Lymphs Abs: 1 10*3/uL (ref 0.7–4.0)
MCH: 28.9 pg (ref 26.0–34.0)
MCHC: 30.9 g/dL (ref 30.0–36.0)
MCV: 93.3 fL (ref 80.0–100.0)
Monocytes Absolute: 0.8 10*3/uL (ref 0.1–1.0)
Monocytes Relative: 7 %
Neutro Abs: 10 10*3/uL — ABNORMAL HIGH (ref 1.7–7.7)
Neutrophils Relative %: 83 %
Platelet Count: 164 10*3/uL (ref 150–400)
RBC: 4.02 MIL/uL — ABNORMAL LOW (ref 4.22–5.81)
RDW: 23.4 % — ABNORMAL HIGH (ref 11.5–15.5)
WBC Count: 11.8 10*3/uL — ABNORMAL HIGH (ref 4.0–10.5)
nRBC: 0 % (ref 0.0–0.2)

## 2021-03-20 MED ORDER — HYDROCODONE-HOMATROPINE 5-1.5 MG/5ML PO SYRP: 5.0000 mL | ORAL_SOLUTION | Freq: Four times a day (QID) | ORAL | 0 refills | Status: DC | PRN

## 2021-03-20 MED ORDER — SODIUM CHLORIDE 0.9 % IV SOLN
233.6000 mg | Freq: Once | INTRAVENOUS | Status: AC
  Administered 2021-03-20: 230 mg via INTRAVENOUS
  Filled 2021-03-20: qty 23

## 2021-03-20 MED ORDER — PACLITAXEL PROTEIN-BOUND CHEMO INJECTION 100 MG
125.0000 mg/m2 | Freq: Once | INTRAVENOUS | Status: AC
  Administered 2021-03-20: 175 mg via INTRAVENOUS
  Filled 2021-03-20: qty 35

## 2021-03-20 MED ORDER — SODIUM CHLORIDE 0.9 % IV SOLN
Freq: Once | INTRAVENOUS | Status: AC
  Filled 2021-03-20: qty 250

## 2021-03-20 MED ORDER — SODIUM CHLORIDE 0.9 % IV SOLN
150.0000 mg | Freq: Once | INTRAVENOUS | Status: AC
  Administered 2021-03-20: 150 mg via INTRAVENOUS
  Filled 2021-03-20: qty 150

## 2021-03-20 MED ORDER — SODIUM CHLORIDE 0.9 % IV SOLN
200.0000 mg | Freq: Once | INTRAVENOUS | Status: AC
  Administered 2021-03-20: 200 mg via INTRAVENOUS
  Filled 2021-03-20: qty 8

## 2021-03-20 MED ORDER — PALONOSETRON HCL INJECTION 0.25 MG/5ML
0.2500 mg | Freq: Once | INTRAVENOUS | Status: AC
  Administered 2021-03-20: 0.25 mg via INTRAVENOUS

## 2021-03-20 MED ORDER — PALONOSETRON HCL INJECTION 0.25 MG/5ML
INTRAVENOUS | Status: AC
  Filled 2021-03-20: qty 5

## 2021-03-20 MED ORDER — SODIUM CHLORIDE 0.9 % IV SOLN
10.0000 mg | Freq: Once | INTRAVENOUS | Status: AC
  Administered 2021-03-20: 10 mg via INTRAVENOUS
  Filled 2021-03-20: qty 10

## 2021-03-20 NOTE — Progress Notes (Signed)
[place] [phone]   Fax:([phone]) [place] [address]  DIAGNOSIS: Stage IIIC non-small cell lung cancer, squamous cell carcinoma.  George George [name] presented with a right upper lobe mass and precardial lymphadenopathy and ipsilateral and contralateral hilar lymphadenopathy.  George George [name] was diagnosed in April 2021  PRIOR THERAPY: 1) Weekly concurrent chemoradiation with carboplatin for an AUC of 2 and paclitaxel 45 mg per metered squared.Status post6cycles.Taxol discontinued from treatment plan starting from cycle #3 due to adverse reaction.Last dose of chemotherapy 06/11/2020. Last day of radiation 06/14/2020. 2)  Consolidation immunotherapy with Imfinzi 1500 mg IV every 4 weeks.  First dose expected on 07/18/20.  Status post 3 cycles.  CURRENT THERAPY:  First-line systemic chemotherapy with carboplatin for AUC of 5, Abraxane 125 mg/M2 and Keytruda 200 mg IV every 3 weeks with Neulasta support.  1st dose January 16, 2021.  Status post 3 cycles.  George George [name] did not receive Abraxane with George first cycle.  INTERVAL HISTORY: George George [name] 80 y.o. George [name] returns to George clinic today for follow-up visit.  George George [name] is feeling fine except for George fatigue and generalized weakness.  George George [name] also complain of right-sided chest pain and dry cough with mild shortness of breath with exertion but no hemoptysis.  George George [name] denied having any current fever or chills.  George George [name] has no nausea, vomiting, diarrhea or constipation.  George George [name] has no headache or visual changes.  George George [name] denied having any significant weight loss or night sweats.  George George [name] continues to tolerate his current treatment with carboplatin, Abraxane and Keytruda fairly well.  George George [name] had repeat CT scan of George chest, abdomen pelvis performed recently and is here for evaluation and discussion of his scan results.  MEDICAL HISTORY: Past Medical History:  Diagnosis Date   . [facility]) Prostate  . Hypertension   . Lung cancer (George [name])     ALLERGIES:  is allergic to paclitaxel and aspirin.  MEDICATIONS:  Current Outpatient Medications  Medication Sig Dispense Refill  . acetaminophen (TYLENOL) 500 MG tablet Take 500 mg by mouth every 6 (six) hours as needed for moderate pain.    [facility] Kitchen albuterol (VENTOLIN HFA) 108 (90 Base) MCG/ACT inhaler INHALE 2 PUFFS BY MOUTH FOUR TIMES A DAY AS NEEDED    . ascorbic acid (VITAMIN C) 500 MG tablet TAKE ONE TABLET BY MOUTH MONDAY, WEDNESDAY AND FRIDAY  WITH FERROUS GLUCONATE    . benzonatate (TESSALON) 100 MG capsule Take 1 capsule by mouth 3 (three) times daily as needed.    . Cholecalciferol 50 MCG (2000 UT) TABS Take 2 tablets by mouth daily.    . Ensure Plus (ENSURE PLUS) LIQD TAKE 1 CAN BY MOUTH THREE TIMES A DAY    . ferrous gluconate (FERGON) 324 MG tablet TAKE ONE TABLET BY MOUTH MONDAY, WEDNESDAY AND FRIDAY    . levothyroxine (SYNTHROID) 75 MCG tablet Take 1 tablet (75 mcg total) by mouth daily before breakfast. 30 tablet 1  . Nutritional Supplements (ENSURE ACTIVE PO) TAKE 1 CAN BY MOUTH THREE TIMES A DAY    . potassium chloride (MICRO-K) 10 MEQ CR capsule Take 1 capsule by mouth daily.    . prochlorperazine (COMPAZINE) 10 MG tablet Take 1 tablet (10 mg total) by mouth every 6 (six) hours as needed. 30 tablet 2  . sucralfate (CARAFATE) 1 g tablet Take 1 tablet (1 g total) by mouth 4 (four) times  daily -  with meals and at bedtime. Crush and dissolve in 15 mL of warm water prior to swallowing 60 tablet 1  . vitamin B-12 (CYANOCOBALAMIN) 500 MCG tablet Take 2 tablets by mouth daily.     No current facility-administered medications for George visit.    SURGICAL HISTORY: No past surgical history on file.  REVIEW OF SYSTEMS:  Constitutional: positive for fatigue Eyes: negative Ears, nose, mouth, throat, and face: negative Respiratory: positive for cough, dyspnea on exertion and pleurisy/chest pain Cardiovascular:  negative Gastrointestinal: negative Genitourinary:negative Integument/breast: negative Hematologic/lymphatic: negative Musculoskeletal:positive for muscle weakness Neurological: negative Behavioral/Psych: negative Endocrine: negative Allergic/Immunologic: negative   PHYSICAL EXAMINATION: General appearance: alert, cooperative, fatigued and no distress Head: Normocephalic, without obvious abnormality, atraumatic Neck: no adenopathy, no JVD, supple, symmetrical, trachea midline and thyroid not enlarged, symmetric, no tenderness/mass/nodules Lymph nodes: Cervical, supraclavicular, and axillary nodes normal. Resp: clear to auscultation bilaterally Back: symmetric, no curvature. ROM normal. No CVA tenderness. Cardio: regular rate and rhythm, S1, S2 normal, no murmur, click, rub or gallop GI: soft, non-tender; bowel sounds normal; no masses,  no organomegaly Extremities: extremities normal, atraumatic, no cyanosis or edema Neurologic: Alert and oriented X 3, normal strength and tone. Normal symmetric reflexes. Normal coordination and gait  ECOG PERFORMANCE STATUS: 1 - Symptomatic but completely ambulatory  Blood pressure 127/81, pulse 98, temperature (!) 96.6 F (35.9 C), temperature source Tympanic, resp. rate 20, height 5\' 6"  (1.676 m), weight 98 lb (44.5 kg), SpO2 95 %.  LABORATORY DATA: Lab Results  Component Value Date   WBC 11.8 (H) 03/20/2021   HGB 11.6 (L) 03/20/2021   HCT 37.5 (L) 03/20/2021   MCV 93.3 03/20/2021   PLT 164 03/20/2021      Chemistry      Component Value Date/Time   NA 143 03/13/2021 1124   K 4.2 03/13/2021 1124   CL 102 03/13/2021 1124   CO2 30 03/13/2021 1124   BUN 7 (L) 03/13/2021 1124   CREATININE 1.08 03/13/2021 1124      Component Value Date/Time   CALCIUM 8.2 (L) 03/13/2021 1124   ALKPHOS 114 03/13/2021 1124   AST 13 (L) 03/13/2021 1124   ALT 7 03/13/2021 1124   BILITOT 0.5 03/13/2021 1124       RADIOGRAPHIC STUDIES: CT Chest W  Contrast  Result Date: 03/19/2021 CLINICAL DATA:  Primary Cancer Type: Lung Imaging Indication: Assess response to therapy Interval therapy since last imaging? Yes Initial Cancer Diagnosis Date: 03/07/2020; Established by: Biopsy-proven Detailed Pathology: Stage IIIC non-small cell lung cancer, squamous cell carcinoma. Primary Tumor location: Right upper lobe. Surgeries: No. Chemotherapy: Yes; Ongoing? Yes; Most recent administration: 02/27/2021 Immunotherapy?  Yes; Type: Keytruda; Ongoing? Yes Radiation therapy? Yes; Date Range: 04/30/2020 - 06/14/2020; Target: Right lung Other Cancers: Prostate cancer 2012, with external beam radiation as well as radiation seed implants. EXAM: CT CHEST, ABDOMEN, AND PELVIS WITH CONTRAST TECHNIQUE: Multidetector CT imaging of George chest, abdomen and pelvis was performed following George standard protocol during bolus administration of intravenous contrast. CONTRAST:  171mL OMNIPAQUE IOHEXOL 300 MG/ML  SOLN COMPARISON:  Most recent CT chest 01/06/2021.  03/14/2020 PET-CT. FINDINGS: CT CHEST FINDINGS Cardiovascular: Coronary artery calcification and aortic atherosclerotic calcification. Mediastinum/Nodes: No axillary supraclavicular adenopathy no mediastinal hilar adenopathy no pericardial effusion. Esophagus normal. Enhancing precarinal node measures 8 mm unchanged Lungs/Pleura: Enhancing mass in George anterior RIGHT upper lobe measures 3.7 by 3.4 cm compared with 3.2 by 2.7 cm measures at a similar level (image 31/series 2). George enhancing  lesion extends into George intracostal space of George anterior chest wall (image 33/2). George portion extending to George chest wall measures 12 mm compared to 7 mm. Elsewhere in George lungs, no suspicious nodularity. Extensive centrilobular emphysema noted. Musculoskeletal: Compression deformity in George midthoracic spine unchanged. Remote fracture of George proximal RIGHT humerus. CT ABDOMEN AND PELVIS FINDINGS Hepatobiliary: No focal hepatic lesion. No biliary  ductal dilatation. Gallbladder is normal. Common bile duct is normal. Pancreas: Pancreas is normal. No ductal dilatation. No pancreatic inflammation. Spleen: Normal spleen Adrenals/urinary tract: Adrenal glands and kidneys are normal. George ureters and bladder normal. Stomach/Bowel: Stomach, small bowel, appendix, and cecum are normal. George colon and rectosigmoid colon are normal. Vascular/Lymphatic: Abdominal aorta is normal caliber with atherosclerotic calcification. There is no retroperitoneal or periportal lymphadenopathy. No pelvic lymphadenopathy. Reproductive: Brachytherapy seeds in George prostate gland Other: And no peritoneal metastasis Musculoskeletal: No metastatic disease in George pelvis or lumbar spine IMPRESSION: Chest Impression: 1. Mild increase in volume of RIGHT upper lobe mass with mild progressive extension into George anterior chest wall. 2. No mediastinal adenopathy. Abdomen / Pelvis Impression: 1. No visceral metastasis in George abdomen pelvis. 2. No abdominopelvic adenopathy. 3. No clear evidence skeletal metastasis. Electronically Signed   By: Suzy Bouchard M.D.   On: 03/19/2021 11:07   CT Abdomen Pelvis W Contrast  Result Date: 03/19/2021 CLINICAL DATA:  Primary Cancer Type: Lung Imaging Indication: Assess response to therapy Interval therapy since last imaging? Yes Initial Cancer Diagnosis Date: 03/07/2020; Established by: Biopsy-proven Detailed Pathology: Stage IIIC non-small cell lung cancer, squamous cell carcinoma. Primary Tumor location: Right upper lobe. Surgeries: No. Chemotherapy: Yes; Ongoing? Yes; Most recent administration: 02/27/2021 Immunotherapy?  Yes; Type: Keytruda; Ongoing? Yes Radiation therapy? Yes; Date Range: 04/30/2020 - 06/14/2020; Target: Right lung Other Cancers: Prostate cancer 2012, with external beam radiation as well as radiation seed implants. EXAM: CT CHEST, ABDOMEN, AND PELVIS WITH CONTRAST TECHNIQUE: Multidetector CT imaging of George chest, abdomen and pelvis was  performed following George standard protocol during bolus administration of intravenous contrast. CONTRAST:  133mL OMNIPAQUE IOHEXOL 300 MG/ML  SOLN COMPARISON:  Most recent CT chest 01/06/2021.  03/14/2020 PET-CT. FINDINGS: CT CHEST FINDINGS Cardiovascular: Coronary artery calcification and aortic atherosclerotic calcification. Mediastinum/Nodes: No axillary supraclavicular adenopathy no mediastinal hilar adenopathy no pericardial effusion. Esophagus normal. Enhancing precarinal node measures 8 mm unchanged Lungs/Pleura: Enhancing mass in George anterior RIGHT upper lobe measures 3.7 by 3.4 cm compared with 3.2 by 2.7 cm measures at a similar level (image 31/series 2). George enhancing lesion extends into George intracostal space of George anterior chest wall (image 33/2). George portion extending to George chest wall measures 12 mm compared to 7 mm. Elsewhere in George lungs, no suspicious nodularity. Extensive centrilobular emphysema noted. Musculoskeletal: Compression deformity in George midthoracic spine unchanged. Remote fracture of George proximal RIGHT humerus. CT ABDOMEN AND PELVIS FINDINGS Hepatobiliary: No focal hepatic lesion. No biliary ductal dilatation. Gallbladder is normal. Common bile duct is normal. Pancreas: Pancreas is normal. No ductal dilatation. No pancreatic inflammation. Spleen: Normal spleen Adrenals/urinary tract: Adrenal glands and kidneys are normal. George ureters and bladder normal. Stomach/Bowel: Stomach, small bowel, appendix, and cecum are normal. George colon and rectosigmoid colon are normal. Vascular/Lymphatic: Abdominal aorta is normal caliber with atherosclerotic calcification. There is no retroperitoneal or periportal lymphadenopathy. No pelvic lymphadenopathy. Reproductive: Brachytherapy seeds in George prostate gland Other: And no peritoneal metastasis Musculoskeletal: No metastatic disease in George pelvis or lumbar spine IMPRESSION: Chest Impression: 1. Mild increase in volume of RIGHT  upper lobe mass with mild  progressive extension into George anterior chest wall. 2. No mediastinal adenopathy. Abdomen / Pelvis Impression: 1. No visceral metastasis in George abdomen pelvis. 2. No abdominopelvic adenopathy. 3. No clear evidence skeletal metastasis. Electronically Signed   By: Suzy Bouchard M.D.   On: 03/19/2021 11:07    ASSESSMENT AND PLAN: George is a very pleasant 80 years old African-American George [name] recently diagnosed with stage IIIc non-small cell lung cancer, squamous cell carcinoma presented with right upper lobe lung mass in addition to ipsilateral and contralateral hilar lymphadenopathy diagnosed in April 2021. Status post a course of concurrent chemoradiation with weekly carboplatin and paclitaxel for 6 cycles with partial response. George George [name] is currently undergoing consolidation treatment with immunotherapy with Imfinzi 1500 mg IV every 4 weeks status post 3 cycles.   George George [name] has been tolerating George treatment well with no concerning adverse effects. George George [name] had repeat CT scan of George chest performed recently.  I personally and independently reviewed George scan images and discussed George results with George George [name] today. Unfortunately his scan showed interval progression of George enhancing tumor within George anterior basal right upper lobe was concern for progressive chest wall involvement.  George George [name] continues to have stable borderline enlarged right hilar and right paratracheal lymph nodes. George George [name] started systemic chemotherapy with a regimen of carboplatin and Keytruda 3 weeks ago.  George George [name] was supposed to receive paclitaxel but because of George hypersensitivity reaction George George [name] had in George past George was held.  George George [name] tolerated George first cycle of his treatment fairly well with no concerning adverse effects. George George [name] is currently on treatment with carboplatin, Abraxane and Keytruda status post 2 more cycles and has been tolerating George treatment well. George George [name] had repeat CT scan of George chest, abdomen pelvis performed recently.  I personally and independently  reviewed George scan images and discussed George result and showed George images with George George [name] today. His scan showed no concerning findings for disease progression except for a slight increase in George right upper lobe lung mass with suspicious chest wall invasion. I recommended for George George [name] to continue his current treatment with George same regimen and George George [name] will proceed with cycle #4 of his chemotherapy today. For George enlarging right upper lobe lung mass with chest wall invasion, I will refer George George [name] back to Dr. Sondra Come for consideration of additional palliative radiotherapy to George area if possible. For George dry cough, I gave him prescription for Hycodan every 6 hours as needed. George George [name] will come back for follow-up visit in 3 weeks for evaluation before George next cycle of his treatment. George George [name] was advised to call immediately if George George [name] has any other concerning symptoms in George interval. George George [name] voices understanding of current disease status and treatment options and is in agreement with George current care plan. All questions were answered. George George [name] knows to call George clinic with any problems, questions or concerns. We can certainly see George George [name] much sooner if necessary.  Disclaimer: George note was dictated with voice recognition software. Similar sounding words can inadvertently be transcribed and may not be corrected upon review.

## 2021-03-20 NOTE — Patient Instructions (Signed)
Steps to Quit Smoking Smoking tobacco is the leading cause of preventable death. It can affect almost every organ in the body. Smoking puts you and people around you at risk for many serious, long-lasting (chronic) diseases. Quitting smoking can be hard, but it is one of the best things that you can do for your health. It is never too late to quit. How do I get ready to quit? When you decide to quit smoking, make a plan to help you succeed. Before you quit:  Pick a date to quit. Set a date within the next 2 weeks to give you time to prepare.  Write down the reasons why you are quitting. Keep this list in places where you will see it often.  Tell your family, friends, and co-workers that you are quitting. Their support is important.  Talk with your doctor about the choices that may help you quit.  Find out if your health insurance will pay for these treatments.  Know the people, places, things, and activities that make you want to smoke (triggers). Avoid them. What first steps can I take to quit smoking?  Throw away all cigarettes at home, at work, and in your car.  Throw away the things that you use when you smoke, such as ashtrays and lighters.  Clean your car. Make sure to empty the ashtray.  Clean your home, including curtains and carpets. What can I do to help me quit smoking? Talk with your doctor about taking medicines and seeing a counselor at the same time. You are more likely to succeed when you do both.  If you are pregnant or breastfeeding, talk with your doctor about counseling or other ways to quit smoking. Do not take medicine to help you quit smoking unless your doctor tells you to do so. To quit smoking: Quit right away  Quit smoking totally, instead of slowly cutting back on how much you smoke over a period of time.  Go to counseling. You are more likely to quit if you go to counseling sessions regularly. Take medicine You may take medicines to help you quit. Some  medicines need a prescription, and some you can buy over-the-counter. Some medicines may contain a drug called nicotine to replace the nicotine in cigarettes. Medicines may:  Help you to stop having the desire to smoke (cravings).  Help to stop the problems that come when you stop smoking (withdrawal symptoms). Your doctor may ask you to use:  Nicotine patches, gum, or lozenges.  Nicotine inhalers or sprays.  Non-nicotine medicine that is taken by mouth. Find resources Find resources and other ways to help you quit smoking and remain smoke-free after you quit. These resources are most helpful when you use them often. They include:  Online chats with a counselor.  Phone quitlines.  Printed self-help materials.  Support groups or group counseling.  Text messaging programs.  Mobile phone apps. Use apps on your mobile phone or tablet that can help you stick to your quit plan. There are many free apps for mobile phones and tablets as well as websites. Examples include Quit Guide from the CDC and smokefree.gov   What things can I do to make it easier to quit?  Talk to your family and friends. Ask them to support and encourage you.  Call a phone quitline (1-800-QUIT-NOW), reach out to support groups, or work with a counselor.  Ask people who smoke to not smoke around you.  Avoid places that make you want to smoke,   such as: ? Bars. ? Parties. ? Smoke-break areas at work.  Spend time with people who do not smoke.  Lower the stress in your life. Stress can make you want to smoke. Try these things to help your stress: ? Getting regular exercise. ? Doing deep-breathing exercises. ? Doing yoga. ? Meditating. ? Doing a body scan. To do this, close your eyes, focus on one area of your body at a time from head to toe. Notice which parts of your body are tense. Try to relax the muscles in those areas.   How will I feel when I quit smoking? Day 1 to 3 weeks Within the first 24 hours,  you may start to have some problems that come from quitting tobacco. These problems are very bad 2-3 days after you quit, but they do not often last for more than 2-3 weeks. You may get these symptoms:  Mood swings.  Feeling restless, nervous, angry, or annoyed.  Trouble concentrating.  Dizziness.  Strong desire for high-sugar foods and nicotine.  Weight gain.  Trouble pooping (constipation).  Feeling like you may vomit (nausea).  Coughing or a sore throat.  Changes in how the medicines that you take for other issues work in your body.  Depression.  Trouble sleeping (insomnia). Week 3 and afterward After the first 2-3 weeks of quitting, you may start to notice more positive results, such as:  Better sense of smell and taste.  Less coughing and sore throat.  Slower heart rate.  Lower blood pressure.  Clearer skin.  Better breathing.  Fewer sick days. Quitting smoking can be hard. Do not give up if you fail the first time. Some people need to try a few times before they succeed. Do your best to stick to your quit plan, and talk with your doctor if you have any questions or concerns. Summary  Smoking tobacco is the leading cause of preventable death. Quitting smoking can be hard, but it is one of the best things that you can do for your health.  When you decide to quit smoking, make a plan to help you succeed.  Quit smoking right away, not slowly over a period of time.  When you start quitting, seek help from your doctor, family, or friends. This information is not intended to replace advice given to you by your health care provider. Make sure you discuss any questions you have with your health care provider. Document Revised: 08/18/2019 Document Reviewed: 02/11/2019 Elsevier Patient Education  2021 Elsevier Inc.  

## 2021-03-20 NOTE — Patient Instructions (Signed)
[facility] Discharge Instructions for Patients Receiving Chemotherapy  Today you received the following chemotherapy agents keytruda. Abraxane, carboplatin  To help prevent nausea and vomiting after your treatment, we encourage you to take your nausea medication as directed   If you develop nausea and vomiting that is not controlled by your nausea medication, call the clinic.   BELOW ARE SYMPTOMS THAT SHOULD BE REPORTED IMMEDIATELY:  *FEVER GREATER THAN 100.5 F  *CHILLS WITH OR WITHOUT FEVER  NAUSEA AND VOMITING THAT IS NOT CONTROLLED WITH YOUR NAUSEA MEDICATION  *UNUSUAL SHORTNESS OF BREATH  *UNUSUAL BRUISING OR BLEEDING  TENDERNESS IN MOUTH AND THROAT WITH OR WITHOUT PRESENCE OF ULCERS  *URINARY PROBLEMS  *BOWEL PROBLEMS  UNUSUAL RASH Items with * indicate a potential emergency and should be followed up as soon as possible.  Feel free to call the clinic should you have any questions or concerns. The clinic phone number is [phone].  Please show the [facility] at check-in to the Emergency Department and triage nurse.

## 2021-03-21 ENCOUNTER — Encounter: Payer: Self-pay | Admitting: *Deleted

## 2021-03-21 NOTE — Progress Notes (Signed)
I received a message today from rad onc that patient needs authorization from [place] for him to be seen there. I contacted authorization coordinator via chat message and left her a vm message to see how I can help with authorization.

## 2021-03-22 ENCOUNTER — Inpatient Hospital Stay: Payer: No Typology Code available for payment source

## 2021-03-22 ENCOUNTER — Other Ambulatory Visit: Payer: Self-pay

## 2021-03-22 VITALS — BP 136/84 | HR 107 | Temp 96.3°F

## 2021-03-22 DIAGNOSIS — C3411 Malignant neoplasm of upper lobe, right bronchus or lung: Secondary | ICD-10-CM

## 2021-03-22 DIAGNOSIS — Z5112 Encounter for antineoplastic immunotherapy: Secondary | ICD-10-CM | POA: Diagnosis not present

## 2021-03-22 MED ORDER — PEGFILGRASTIM-BMEZ 6 MG/0.6ML ~~LOC~~ SOSY
6.0000 mg | PREFILLED_SYRINGE | Freq: Once | SUBCUTANEOUS | Status: AC
  Administered 2021-03-22: 6 mg via SUBCUTANEOUS

## 2021-03-22 NOTE — Patient Instructions (Signed)
Pegfilgrastim injection What is this medicine? PEGFILGRASTIM (PEG fil gra stim) is a long-acting granulocyte colony-stimulating factor that stimulates the growth of neutrophils, a type of white blood cell important in the body's fight against infection. It is used to reduce the incidence of fever and infection in patients with certain types of cancer who are receiving chemotherapy that affects the bone marrow, and to increase survival after being exposed to high doses of radiation. This medicine may be used for other purposes; ask your health care provider or pharmacist if you have questions. COMMON BRAND NAME(S): Fulphila, Neulasta, Nyvepria, UDENYCA, Ziextenzo What should I tell my health care provider before I take this medicine? They need to know if you have any of these conditions:  kidney disease  latex allergy  ongoing radiation therapy  sickle cell disease  skin reactions to acrylic adhesives (On-Body Injector only)  an unusual or allergic reaction to pegfilgrastim, filgrastim, other medicines, foods, dyes, or preservatives  pregnant or trying to get pregnant  breast-feeding How should I use this medicine? This medicine is for injection under the skin. If you get this medicine at home, you will be taught how to prepare and give the pre-filled syringe or how to use the On-body Injector. Refer to the patient Instructions for Use for detailed instructions. Use exactly as directed. Tell your healthcare provider immediately if you suspect that the On-body Injector may not have performed as intended or if you suspect the use of the On-body Injector resulted in a missed or partial dose. It is important that you put your used needles and syringes in a special sharps container. Do not put them in a trash can. If you do not have a sharps container, call your pharmacist or healthcare provider to get one. Talk to your pediatrician regarding the use of this medicine in children. While this drug  may be prescribed for selected conditions, precautions do apply. Overdosage: If you think you have taken too much of this medicine contact a poison control center or emergency room at once. NOTE: This medicine is only for you. Do not share this medicine with others. What if I miss a dose? It is important not to miss your dose. Call your doctor or health care professional if you miss your dose. If you miss a dose due to an On-body Injector failure or leakage, a new dose should be administered as soon as possible using a single prefilled syringe for manual use. What may interact with this medicine? Interactions have not been studied. This list may not describe all possible interactions. Give your health care provider a list of all the medicines, herbs, non-prescription drugs, or dietary supplements you use. Also tell them if you smoke, drink alcohol, or use illegal drugs. Some items may interact with your medicine. What should I watch for while using this medicine? Your condition will be monitored carefully while you are receiving this medicine. You may need blood work done while you are taking this medicine. Talk to your health care provider about your risk of cancer. You may be more at risk for certain types of cancer if you take this medicine. If you are going to need a MRI, CT scan, or other procedure, tell your doctor that you are using this medicine (On-Body Injector only). What side effects may I notice from receiving this medicine? Side effects that you should report to your doctor or health care professional as soon as possible:  allergic reactions (skin rash, itching or hives, swelling of   the face, lips, or tongue)  back pain  dizziness  fever  pain, redness, or irritation at site where injected  pinpoint red spots on the skin  red or dark-brown urine  shortness of breath or breathing problems  stomach or side pain, or pain at the shoulder  swelling  tiredness  trouble  passing urine or change in the amount of urine  unusual bruising or bleeding Side effects that usually do not require medical attention (report to your doctor or health care professional if they continue or are bothersome):  bone pain  muscle pain This list may not describe all possible side effects. Call your doctor for medical advice about side effects. You may report side effects to FDA at 1-800-FDA-1088. Where should I keep my medicine? Keep out of the reach of children. If you are using this medicine at home, you will be instructed on how to store it. Throw away any unused medicine after the expiration date on the label. NOTE: This sheet is a summary. It may not cover all possible information. If you have questions about this medicine, talk to your doctor, pharmacist, or health care provider.  2021 Elsevier/Gold Standard (2019-12-15 13:20:51)  

## 2021-03-27 ENCOUNTER — Other Ambulatory Visit: Payer: Self-pay

## 2021-03-27 ENCOUNTER — Inpatient Hospital Stay: Payer: No Typology Code available for payment source

## 2021-03-27 DIAGNOSIS — C3411 Malignant neoplasm of upper lobe, right bronchus or lung: Secondary | ICD-10-CM

## 2021-03-27 DIAGNOSIS — Z5112 Encounter for antineoplastic immunotherapy: Secondary | ICD-10-CM | POA: Diagnosis not present

## 2021-03-27 LAB — CBC WITH DIFFERENTIAL (CANCER CENTER ONLY)
Abs Immature Granulocytes: 0.3 10*3/uL — ABNORMAL HIGH (ref 0.00–0.07)
Basophils Absolute: 0.1 10*3/uL (ref 0.0–0.1)
Basophils Relative: 1 %
Eosinophils Absolute: 0 10*3/uL (ref 0.0–0.5)
Eosinophils Relative: 0 %
HCT: 31.8 % — ABNORMAL LOW (ref 39.0–52.0)
Hemoglobin: 10.2 g/dL — ABNORMAL LOW (ref 13.0–17.0)
Immature Granulocytes: 7 %
Lymphocytes Relative: 11 %
Lymphs Abs: 0.5 10*3/uL — ABNORMAL LOW (ref 0.7–4.0)
MCH: 30.5 pg (ref 26.0–34.0)
MCHC: 32.1 g/dL (ref 30.0–36.0)
MCV: 95.2 fL (ref 80.0–100.0)
Monocytes Absolute: 0.4 10*3/uL (ref 0.1–1.0)
Monocytes Relative: 9 %
Neutro Abs: 3.1 10*3/uL (ref 1.7–7.7)
Neutrophils Relative %: 72 %
Platelet Count: 83 10*3/uL — ABNORMAL LOW (ref 150–400)
RBC: 3.34 MIL/uL — ABNORMAL LOW (ref 4.22–5.81)
RDW: 21.9 % — ABNORMAL HIGH (ref 11.5–15.5)
WBC Count: 4.4 10*3/uL (ref 4.0–10.5)
nRBC: 0 % (ref 0.0–0.2)

## 2021-03-27 LAB — CMP (CANCER CENTER ONLY)
ALT: <6 U/L (ref 0–44)
AST: 9 U/L — ABNORMAL LOW (ref 15–41)
Albumin: 3.4 g/dL — ABNORMAL LOW (ref 3.5–5.0)
Alkaline Phosphatase: 81 U/L (ref 38–126)
Anion gap: 11 (ref 5–15)
BUN: 8 mg/dL (ref 8–23)
CO2: 30 mmol/L (ref 22–32)
Calcium: 8.4 mg/dL — ABNORMAL LOW (ref 8.9–10.3)
Chloride: 102 mmol/L (ref 98–111)
Creatinine: 0.89 mg/dL (ref 0.61–1.24)
GFR, Estimated: >60 mL/min (ref >60)
Glucose, Bld: 145 mg/dL — ABNORMAL HIGH (ref 70–99)
Potassium: 3.3 mmol/L — ABNORMAL LOW (ref 3.5–5.1)
Sodium: 143 mmol/L (ref 135–145)
Total Bilirubin: 0.9 mg/dL (ref 0.3–1.2)
Total Protein: 6.7 g/dL (ref 6.5–8.1)

## 2021-03-30 ENCOUNTER — Other Ambulatory Visit: Payer: Self-pay | Admitting: Physician Assistant

## 2021-03-30 DIAGNOSIS — E039 Hypothyroidism, unspecified: Secondary | ICD-10-CM

## 2021-04-02 NOTE — Progress Notes (Signed)
Location of tumor and Histology per Pathology Report: R[facility] lung 03/13/2020   Past/Anticipated interventions by surgeon, if any:    Past/Anticipated interventions by medical oncology, if any:     Pain issues, if any:  no   SAFETY ISSUES:  Prior radiation? yes, IMRT to right lung  Pacemaker/ICD? no  Possible current pregnancy?no  Is the patient on methotrexate? no  Current Complaints / other details:  Shortness of breath with activity     Vitals:   04/09/21 1356  BP: 118/77  Pulse: (!) 103  Resp: (!) 24  Temp: 98.2 F (36.8 C)  SpO2: 96%  Weight: 95 lb 12.8 oz (43.5 kg)  Height: 5\' 6"  (1.676 m)

## 2021-04-03 ENCOUNTER — Telehealth: Payer: Self-pay | Admitting: Physician Assistant

## 2021-04-03 ENCOUNTER — Inpatient Hospital Stay: Payer: No Typology Code available for payment source

## 2021-04-03 ENCOUNTER — Other Ambulatory Visit: Payer: Self-pay

## 2021-04-03 DIAGNOSIS — C3411 Malignant neoplasm of upper lobe, right bronchus or lung: Secondary | ICD-10-CM

## 2021-04-03 DIAGNOSIS — Z5112 Encounter for antineoplastic immunotherapy: Secondary | ICD-10-CM | POA: Diagnosis not present

## 2021-04-03 LAB — CBC WITH DIFFERENTIAL (CANCER CENTER ONLY)
Abs Immature Granulocytes: 0.12 10*3/uL — ABNORMAL HIGH (ref 0.00–0.07)
Basophils Absolute: 0 10*3/uL (ref 0.0–0.1)
Basophils Relative: 0 %
Eosinophils Absolute: 0 10*3/uL (ref 0.0–0.5)
Eosinophils Relative: 0 %
HCT: 35.8 % — ABNORMAL LOW (ref 39.0–52.0)
Hemoglobin: 11.2 g/dL — ABNORMAL LOW (ref 13.0–17.0)
Immature Granulocytes: 1 %
Lymphocytes Relative: 7 %
Lymphs Abs: 0.9 10*3/uL (ref 0.7–4.0)
MCH: 30.5 pg (ref 26.0–34.0)
MCHC: 31.3 g/dL (ref 30.0–36.0)
MCV: 97.5 fL (ref 80.0–100.0)
Monocytes Absolute: 0.7 10*3/uL (ref 0.1–1.0)
Monocytes Relative: 5 %
Neutro Abs: 11 10*3/uL — ABNORMAL HIGH (ref 1.7–7.7)
Neutrophils Relative %: 87 %
Platelet Count: 127 10*3/uL — ABNORMAL LOW (ref 150–400)
RBC: 3.67 MIL/uL — ABNORMAL LOW (ref 4.22–5.81)
RDW: 21.3 % — ABNORMAL HIGH (ref 11.5–15.5)
WBC Count: 12.7 10*3/uL — ABNORMAL HIGH (ref 4.0–10.5)
nRBC: 0 % (ref 0.0–0.2)

## 2021-04-03 LAB — CMP (CANCER CENTER ONLY)
ALT: <6 U/L (ref 0–44)
AST: 12 U/L — ABNORMAL LOW (ref 15–41)
Albumin: 3.5 g/dL (ref 3.5–5.0)
Alkaline Phosphatase: 95 U/L (ref 38–126)
Anion gap: 12 (ref 5–15)
BUN: 8 mg/dL (ref 8–23)
CO2: 31 mmol/L (ref 22–32)
Calcium: 8.5 mg/dL — ABNORMAL LOW (ref 8.9–10.3)
Chloride: 103 mmol/L (ref 98–111)
Creatinine: 0.98 mg/dL (ref 0.61–1.24)
GFR, Estimated: >60 mL/min (ref >60)
Glucose, Bld: 115 mg/dL — ABNORMAL HIGH (ref 70–99)
Potassium: 3.8 mmol/L (ref 3.5–5.1)
Sodium: 146 mmol/L — ABNORMAL HIGH (ref 135–145)
Total Bilirubin: 0.6 mg/dL (ref 0.3–1.2)
Total Protein: 7.2 g/dL (ref 6.5–8.1)

## 2021-04-03 LAB — TSH: TSH: 60.234 u[IU]/mL — ABNORMAL HIGH (ref 0.320–4.118)

## 2021-04-03 NOTE — Telephone Encounter (Signed)
I called the patient's niece to see if she can check with her uncle to see if he is still taking his synthroid. He has a history of non-compliance with medication. She will check and let us know.

## 2021-04-03 NOTE — Telephone Encounter (Signed)
I tried calling the patient to make sure he is taking his thyroid medication. Unable to reach him. Requested he call us back so we know if we need to make dose adjustments.

## 2021-04-09 ENCOUNTER — Ambulatory Visit
Admission: RE | Admit: 2021-04-09 | Discharge: 2021-04-09 | Disposition: A | Discharge status: Home / Self Care | Payer: No Typology Code available for payment source | Source: Ambulatory Visit | Attending: Internal Medicine | Admitting: Internal Medicine

## 2021-04-09 ENCOUNTER — Encounter: Payer: Self-pay | Admitting: Radiation Oncology

## 2021-04-09 ENCOUNTER — Other Ambulatory Visit: Payer: Self-pay

## 2021-04-09 ENCOUNTER — Ambulatory Visit
Admission: RE | Admit: 2021-04-09 | Discharge: 2021-04-09 | Disposition: A | Discharge status: Home / Self Care | Payer: No Typology Code available for payment source | Source: Ambulatory Visit | Attending: Radiation Oncology | Admitting: Radiation Oncology

## 2021-04-09 VITALS — BP 118/77 | HR 103 | Temp 98.2°F | Resp 24 | Ht 66.0 in | Wt 95.8 lb

## 2021-04-09 DIAGNOSIS — I10 Essential (primary) hypertension: Secondary | ICD-10-CM | POA: Insufficient documentation

## 2021-04-09 DIAGNOSIS — Z79899 Other long term (current) drug therapy: Secondary | ICD-10-CM | POA: Insufficient documentation

## 2021-04-09 DIAGNOSIS — Z9221 Personal history of antineoplastic chemotherapy: Secondary | ICD-10-CM | POA: Diagnosis not present

## 2021-04-09 DIAGNOSIS — Z809 Family history of malignant neoplasm, unspecified: Secondary | ICD-10-CM | POA: Diagnosis not present

## 2021-04-09 DIAGNOSIS — C3411 Malignant neoplasm of upper lobe, right bronchus or lung: Secondary | ICD-10-CM | POA: Insufficient documentation

## 2021-04-09 DIAGNOSIS — I700 Atherosclerosis of aorta: Secondary | ICD-10-CM | POA: Diagnosis not present

## 2021-04-09 DIAGNOSIS — Z923 Personal history of irradiation: Secondary | ICD-10-CM | POA: Diagnosis not present

## 2021-04-09 DIAGNOSIS — Z803 Family history of malignant neoplasm of breast: Secondary | ICD-10-CM | POA: Insufficient documentation

## 2021-04-09 DIAGNOSIS — F17210 Nicotine dependence, cigarettes, uncomplicated: Secondary | ICD-10-CM | POA: Insufficient documentation

## 2021-04-09 HISTORY — DX: Personal history of irradiation: Z92.3

## 2021-04-09 NOTE — Progress Notes (Signed)
Radiation Oncology         [phone] ________________________________  Re-Consultation Note  Name: George [name] MRN: [id number]  Date: 04/09/2021  DOB: 06-Aug-1941  EG:BTDVVO, Andria Rhein, MD   REFERRING PHYSICIAN: Curt Bears, MD  DIAGNOSIS: The primary encounter diagnosis was Malignant neoplasm of right upper lobe of lung ([place]). A diagnosis of Primary cancer of right upper lobe of lung ([place]) was also pertinent to this visit.  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 male who is seen as a courtesy of Dr. Julien Nordmann for an opinion concerning radiation therapy as part of management for his lung cancer. Today, he is accompanied by his nephew. This patient was initially seen in consultation on 04/04/2020 for his right upper lobe lung cancer and underwent radiation therapy directed at the right lung from 04/30/2020 to 06/14/2020. He was last seen in follow-up on 07/25/2020, at which time he was to continue close follow-up with Dr. Julien Nordmann and follow-up with radiation oncology on an as-needed basis.  Since his last visit, he has completed four cycles of first-line systemic chemotherapy with Carboplatin, Abraxane, and Keytruda with Neulasta support beginning 01/16/2021.   Most recent CT scan of chest, abdomen, and pelvis on 03/18/2021 showed a mild increase in volume of the right upper lobe mass with mild progressive extension into the anterior chest well. There was no mediastinal or abdominopelvic adenopathy, nor was there any clear evidence of skeletal metastasis.   He was last seen by Dr. Julien Nordmann on 03/20/2021, at which time it was recommended that he be considered for additional palliative radiotherapy to the enlarging right upper lobe mass with chest wall invasion.   PAST MEDICAL HISTORY:  Past Medical History:  Diagnosis  Date  . Cancer [facility]) Prostate  . History of radiation therapy 04/30/2020-06/14/2020   IMRT to right lung     Dr Gery Pray  . Hypertension   . Lung cancer ([place])     PAST SURGICAL HISTORY:History reviewed. No pertinent surgical history.  FAMILY HISTORY:  Family History  Problem Relation Age of Onset  . Breast cancer Sister   . Cancer Paternal Uncle     SOCIAL HISTORY:  Social History   Tobacco Use  . Smoking status: Current Every Day Smoker    Packs/day: 0.50    Years: 60.00    Pack years: 30.00    Types: Cigarettes    Last attempt to quit: 03/21/2020    Years since quitting: 1.0  . Smokeless tobacco: Never Used  Vaping Use  . Vaping Use: Never used  Substance Use Topics  . Alcohol use: Not Currently  . Drug use: No    ALLERGIES:  Allergies  Allergen Reactions  . Paclitaxel Shortness Of Breath  . Aspirin     Stomach upset    MEDICATIONS:  Current Outpatient Medications  Medication Sig Dispense Refill  . acetaminophen (TYLENOL) 500 MG tablet Take 500 mg by mouth every 6 (six) hours as needed for moderate pain.    Marland Kitchen albuterol (VENTOLIN HFA) 108 (90 Base) MCG/ACT inhaler INHALE 2 PUFFS BY MOUTH FOUR TIMES A DAY AS NEEDED    . ascorbic acid (VITAMIN C) 500 MG tablet TAKE ONE TABLET BY MOUTH MONDAY, WEDNESDAY AND FRIDAY  WITH FERROUS GLUCONATE    . benzonatate (TESSALON) 100 MG capsule Take 1 capsule by mouth 3 (three) times daily as  needed.    . Ensure Plus (ENSURE PLUS) LIQD TAKE 1 CAN BY MOUTH THREE TIMES A DAY    . ferrous gluconate (FERGON) 324 MG tablet TAKE ONE TABLET BY MOUTH MONDAY, WEDNESDAY AND FRIDAY    . levothyroxine (SYNTHROID) 75 MCG tablet TAKE 1 TABLET BY MOUTH DAILY BEFORE BREAKFAST. 30 tablet 1  . Nutritional Supplements (ENSURE ACTIVE PO) TAKE 1 CAN BY MOUTH THREE TIMES A DAY    . potassium chloride (MICRO-K) 10 MEQ CR capsule Take 1 capsule by mouth daily.    . prochlorperazine (COMPAZINE) 10 MG tablet Take 1 tablet (10 mg total) by mouth every 6  (six) hours as needed. 30 tablet 2  . sucralfate (CARAFATE) 1 g tablet Take 1 tablet (1 g total) by mouth 4 (four) times daily -  with meals and at bedtime. Crush and dissolve in 15 mL of warm water prior to swallowing 60 tablet 1  . vitamin B-12 (CYANOCOBALAMIN) 500 MCG tablet Take 2 tablets by mouth daily.    . Cholecalciferol 50 MCG (2000 UT) TABS Take 2 tablets by mouth daily. (Patient not taking: Reported on 04/09/2021)    . HYDROcodone-homatropine (HYCODAN) 5-1.5 MG/5ML syrup Take 5 mLs by mouth every 6 (six) hours as needed for cough. (Patient not taking: Reported on 04/09/2021) 120 mL 0   No current facility-administered medications for this encounter.    REVIEW OF SYSTEMS:  A 10+ POINT REVIEW OF SYSTEMS WAS OBTAINED including neurology, dermatology, psychiatry, cardiac, respiratory, lymph, extremities, GI, GU, musculoskeletal, constitutional, reproductive, HEENT.  The patient denies any pain in the right upper chest region significant cough or hemoptysis.   PHYSICAL EXAM:  height is 5\' 6"  (1.676 m) and weight is 95 lb 12.8 oz (43.5 kg). His temperature is 98.2 F (36.8 C). His blood pressure is 118/77 and his pulse is 103 (abnormal). His respiration is 24 (abnormal) and oxygen saturation is 96%.   General: Alert and oriented, in no acute distress, sitting comfortably in wheelchair HEENT: Head is normocephalic. Extraocular movements are intact. Oropharynx is clear. Neck: Neck is supple, no palpable cervical or supraclavicular lymphadenopathy. Heart: Regular in rate and rhythm with no murmurs, rubs, or gallops. Chest: Clear to auscultation bilaterally, with no rhonchi, wheezes, or rales. Abdomen: Soft, nontender, nondistended, with no rigidity or guarding. Extremities: No cyanosis or edema. Lymphatics: see Neck Exam Skin: No concerning lesions. Musculoskeletal: symmetric strength and muscle tone throughout. Neurologic: Cranial nerves II through XII are grossly intact. No obvious focalities.  Speech is fluent. Coordination is intact. Psychiatric: Judgment and insight are intact. Affect is appropriate.   ECOG = 2  0 - Asymptomatic (Fully active, able to carry on all predisease activities without restriction)  1 - Symptomatic but completely ambulatory (Restricted in physically strenuous activity but ambulatory and able to carry out work of a light or sedentary nature. For example, light housework, office work)  2 - Symptomatic, <50% in bed during the day (Ambulatory and capable of all self care but unable to carry out any work activities. Up and about more than 50% of waking hours)  3 - Symptomatic, >50% in bed, but not bedbound (Capable of only limited self-care, confined to bed or chair 50% or more of waking hours)  4 - Bedbound (Completely disabled. Cannot carry on any self-care. Totally confined to bed or chair)  5 - Death   Eustace Pen MM, Creech RH, Tormey DC, et al. [phone]). "Toxicity and response criteria of the [facility]". [place]  Oncol. 5 (6): 649-55  LABORATORY DATA:  Lab Results  Component Value Date   WBC 12.7 (H) 04/03/2021   HGB 11.2 (L) 04/03/2021   HCT 35.8 (L) 04/03/2021   MCV 97.5 04/03/2021   PLT 127 (L) 04/03/2021   NEUTROABS 11.0 (H) 04/03/2021   Lab Results  Component Value Date   NA 146 (H) 04/03/2021   K 3.8 04/03/2021   CL 103 04/03/2021   CO2 31 04/03/2021   GLUCOSE 115 (H) 04/03/2021   CREATININE 0.98 04/03/2021   CALCIUM 8.5 (L) 04/03/2021      RADIOGRAPHY: CT Chest W Contrast  Result Date: 03/19/2021 CLINICAL DATA:  Primary Cancer Type: Lung Imaging Indication: Assess response to therapy Interval therapy since last imaging? Yes Initial Cancer Diagnosis Date: 03/07/2020; Established by: Biopsy-proven Detailed Pathology: Stage IIIC non-small cell lung cancer, squamous cell carcinoma. Primary Tumor location: Right upper lobe. Surgeries: No. Chemotherapy: Yes; Ongoing? Yes; Most recent administration: 02/27/2021  Immunotherapy?  Yes; Type: Keytruda; Ongoing? Yes Radiation therapy? Yes; Date Range: 04/30/2020 - 06/14/2020; Target: Right lung Other Cancers: Prostate cancer 2012, with external beam radiation as well as radiation seed implants. EXAM: CT CHEST, ABDOMEN, AND PELVIS WITH CONTRAST TECHNIQUE: Multidetector CT imaging of the chest, abdomen and pelvis was performed following the standard protocol during bolus administration of intravenous contrast. CONTRAST:  167mL OMNIPAQUE IOHEXOL 300 MG/ML  SOLN COMPARISON:  Most recent CT chest 01/06/2021.  03/14/2020 PET-CT. FINDINGS: CT CHEST FINDINGS Cardiovascular: Coronary artery calcification and aortic atherosclerotic calcification. Mediastinum/Nodes: No axillary supraclavicular adenopathy no mediastinal hilar adenopathy no pericardial effusion. Esophagus normal. Enhancing precarinal node measures 8 mm unchanged Lungs/Pleura: Enhancing mass in the anterior RIGHT upper lobe measures 3.7 by 3.4 cm compared with 3.2 by 2.7 cm measures at a similar level (image 31/series 2). The enhancing lesion extends into the intracostal space of the anterior chest wall (image 33/2). The portion extending to the chest wall measures 12 mm compared to 7 mm. Elsewhere in the lungs, no suspicious nodularity. Extensive centrilobular emphysema noted. Musculoskeletal: Compression deformity in the midthoracic spine unchanged. Remote fracture of the proximal RIGHT humerus. CT ABDOMEN AND PELVIS FINDINGS Hepatobiliary: No focal hepatic lesion. No biliary ductal dilatation. Gallbladder is normal. Common bile duct is normal. Pancreas: Pancreas is normal. No ductal dilatation. No pancreatic inflammation. Spleen: Normal spleen Adrenals/urinary tract: Adrenal glands and kidneys are normal. The ureters and bladder normal. Stomach/Bowel: Stomach, small bowel, appendix, and cecum are normal. The colon and rectosigmoid colon are normal. Vascular/Lymphatic: Abdominal aorta is normal caliber with atherosclerotic  calcification. There is no retroperitoneal or periportal lymphadenopathy. No pelvic lymphadenopathy. Reproductive: Brachytherapy seeds in the prostate gland Other: And no peritoneal metastasis Musculoskeletal: No metastatic disease in the pelvis or lumbar spine IMPRESSION: Chest Impression: 1. Mild increase in volume of RIGHT upper lobe mass with mild progressive extension into the anterior chest wall. 2. No mediastinal adenopathy. Abdomen / Pelvis Impression: 1. No visceral metastasis in the abdomen pelvis. 2. No abdominopelvic adenopathy. 3. No clear evidence skeletal metastasis. Electronically Signed   By: Suzy Bouchard M.D.   On: 03/19/2021 11:07   CT Abdomen Pelvis W Contrast  Result Date: 03/19/2021 CLINICAL DATA:  Primary Cancer Type: Lung Imaging Indication: Assess response to therapy Interval therapy since last imaging? Yes Initial Cancer Diagnosis Date: 03/07/2020; Established by: Biopsy-proven Detailed Pathology: Stage IIIC non-small cell lung cancer, squamous cell carcinoma. Primary Tumor location: Right upper lobe. Surgeries: No. Chemotherapy: Yes; Ongoing? Yes; Most recent administration: 02/27/2021 Immunotherapy?  Yes; Type: Keytruda;  Ongoing? Yes Radiation therapy? Yes; Date Range: 04/30/2020 - 06/14/2020; Target: Right lung Other Cancers: Prostate cancer 2012, with external beam radiation as well as radiation seed implants. EXAM: CT CHEST, ABDOMEN, AND PELVIS WITH CONTRAST TECHNIQUE: Multidetector CT imaging of the chest, abdomen and pelvis was performed following the standard protocol during bolus administration of intravenous contrast. CONTRAST:  172mL OMNIPAQUE IOHEXOL 300 MG/ML  SOLN COMPARISON:  Most recent CT chest 01/06/2021.  03/14/2020 PET-CT. FINDINGS: CT CHEST FINDINGS Cardiovascular: Coronary artery calcification and aortic atherosclerotic calcification. Mediastinum/Nodes: No axillary supraclavicular adenopathy no mediastinal hilar adenopathy no pericardial effusion. Esophagus  normal. Enhancing precarinal node measures 8 mm unchanged Lungs/Pleura: Enhancing mass in the anterior RIGHT upper lobe measures 3.7 by 3.4 cm compared with 3.2 by 2.7 cm measures at a similar level (image 31/series 2). The enhancing lesion extends into the intracostal space of the anterior chest wall (image 33/2). The portion extending to the chest wall measures 12 mm compared to 7 mm. Elsewhere in the lungs, no suspicious nodularity. Extensive centrilobular emphysema noted. Musculoskeletal: Compression deformity in the midthoracic spine unchanged. Remote fracture of the proximal RIGHT humerus. CT ABDOMEN AND PELVIS FINDINGS Hepatobiliary: No focal hepatic lesion. No biliary ductal dilatation. Gallbladder is normal. Common bile duct is normal. Pancreas: Pancreas is normal. No ductal dilatation. No pancreatic inflammation. Spleen: Normal spleen Adrenals/urinary tract: Adrenal glands and kidneys are normal. The ureters and bladder normal. Stomach/Bowel: Stomach, small bowel, appendix, and cecum are normal. The colon and rectosigmoid colon are normal. Vascular/Lymphatic: Abdominal aorta is normal caliber with atherosclerotic calcification. There is no retroperitoneal or periportal lymphadenopathy. No pelvic lymphadenopathy. Reproductive: Brachytherapy seeds in the prostate gland Other: And no peritoneal metastasis Musculoskeletal: No metastatic disease in the pelvis or lumbar spine IMPRESSION: Chest Impression: 1. Mild increase in volume of RIGHT upper lobe mass with mild progressive extension into the anterior chest wall. 2. No mediastinal adenopathy. Abdomen / Pelvis Impression: 1. No visceral metastasis in the abdomen pelvis. 2. No abdominopelvic adenopathy. 3. No clear evidence skeletal metastasis. Electronically Signed   By: Suzy Bouchard M.D.   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  I carefully reviewed the patient's previous radiation fields as a relates to his area of progression.  This area progression is in the previous high-dose radiation area which received 60 Gray in 30 fractions.  Given the short interval since progression I do not feel the patient would be a candidate for additional radiation therapy to this area as there would be significant dose to the rib cage area resulting in possible rib fractures pain.  In addition portion of this area was near the brachial plexus.  I discussed these issues with the patient and his family.  He appears to understand the recommendation for no radiation therapy at this time.  Marland Kitchen  PLAN: Patient will continue with systemic therapy on the direction of Dr. Julien Nordmann.  Total time spent in this encounter was 35 minutes which included reviewing the patient's most recent CT scan, follow-ups, treatment, physical examination, and documentation.  ------------------------------------------------  Blair Promise, PhD, MD  This document serves as a record of services personally performed by Gery Pray, MD. It was created on his behalf by Clerance Lav, a trained medical scribe. The creation of this record is based on  the scribe's personal observations and the provider's statements to them. This document has been checked and approved by the attending provider.

## 2021-04-09 NOTE — Progress Notes (Signed)
See MD note for nursing evaluation. °

## 2021-04-10 ENCOUNTER — Inpatient Hospital Stay: Payer: No Typology Code available for payment source

## 2021-04-10 ENCOUNTER — Inpatient Hospital Stay (HOSPITAL_BASED_OUTPATIENT_CLINIC_OR_DEPARTMENT_OTHER): Payer: No Typology Code available for payment source | Admitting: Internal Medicine

## 2021-04-10 ENCOUNTER — Inpatient Hospital Stay: Payer: No Typology Code available for payment source | Attending: Radiation Oncology

## 2021-04-10 ENCOUNTER — Ambulatory Visit: Payer: No Typology Code available for payment source | Admitting: Internal Medicine

## 2021-04-10 ENCOUNTER — Ambulatory Visit: Payer: No Typology Code available for payment source

## 2021-04-10 ENCOUNTER — Encounter: Payer: Self-pay | Admitting: Internal Medicine

## 2021-04-10 ENCOUNTER — Other Ambulatory Visit: Payer: No Typology Code available for payment source

## 2021-04-10 VITALS — BP 114/86 | HR 110 | Temp 97.7°F | Resp 22 | Wt 95.1 lb

## 2021-04-10 VITALS — HR 97

## 2021-04-10 DIAGNOSIS — Z5112 Encounter for antineoplastic immunotherapy: Secondary | ICD-10-CM | POA: Diagnosis not present

## 2021-04-10 DIAGNOSIS — C3411 Malignant neoplasm of upper lobe, right bronchus or lung: Secondary | ICD-10-CM | POA: Insufficient documentation

## 2021-04-10 DIAGNOSIS — R059 Cough, unspecified: Secondary | ICD-10-CM | POA: Insufficient documentation

## 2021-04-10 DIAGNOSIS — Z79899 Other long term (current) drug therapy: Secondary | ICD-10-CM | POA: Insufficient documentation

## 2021-04-10 DIAGNOSIS — Z9221 Personal history of antineoplastic chemotherapy: Secondary | ICD-10-CM | POA: Diagnosis not present

## 2021-04-10 LAB — CMP (CANCER CENTER ONLY)
ALT: <6 U/L (ref 0–44)
AST: 15 U/L (ref 15–41)
Albumin: 3.2 g/dL — ABNORMAL LOW (ref 3.5–5.0)
Alkaline Phosphatase: 81 U/L (ref 38–126)
Anion gap: 12 (ref 5–15)
BUN: 9 mg/dL (ref 8–23)
CO2: 28 mmol/L (ref 22–32)
Calcium: 8.5 mg/dL — ABNORMAL LOW (ref 8.9–10.3)
Chloride: 102 mmol/L (ref 98–111)
Creatinine: 0.98 mg/dL (ref 0.61–1.24)
GFR, Estimated: >60 mL/min (ref >60)
Glucose, Bld: 120 mg/dL — ABNORMAL HIGH (ref 70–99)
Potassium: 3.3 mmol/L — ABNORMAL LOW (ref 3.5–5.1)
Sodium: 142 mmol/L (ref 135–145)
Total Bilirubin: 0.6 mg/dL (ref 0.3–1.2)
Total Protein: 7.1 g/dL (ref 6.5–8.1)

## 2021-04-10 LAB — CBC WITH DIFFERENTIAL (CANCER CENTER ONLY)
Abs Immature Granulocytes: 0.04 10*3/uL (ref 0.00–0.07)
Basophils Absolute: 0 10*3/uL (ref 0.0–0.1)
Basophils Relative: 0 %
Eosinophils Absolute: 0 10*3/uL (ref 0.0–0.5)
Eosinophils Relative: 0 %
HCT: 35.2 % — ABNORMAL LOW (ref 39.0–52.0)
Hemoglobin: 11.1 g/dL — ABNORMAL LOW (ref 13.0–17.0)
Immature Granulocytes: 0 %
Lymphocytes Relative: 9 %
Lymphs Abs: 0.8 10*3/uL (ref 0.7–4.0)
MCH: 30.3 pg (ref 26.0–34.0)
MCHC: 31.5 g/dL (ref 30.0–36.0)
MCV: 96.2 fL (ref 80.0–100.0)
Monocytes Absolute: 0.6 10*3/uL (ref 0.1–1.0)
Monocytes Relative: 7 %
Neutro Abs: 7.4 10*3/uL (ref 1.7–7.7)
Neutrophils Relative %: 84 %
Platelet Count: 151 10*3/uL (ref 150–400)
RBC: 3.66 MIL/uL — ABNORMAL LOW (ref 4.22–5.81)
RDW: 19.5 % — ABNORMAL HIGH (ref 11.5–15.5)
WBC Count: 8.9 10*3/uL (ref 4.0–10.5)
nRBC: 0 % (ref 0.0–0.2)

## 2021-04-10 MED ORDER — SODIUM CHLORIDE 0.9 % IV SOLN
Freq: Once | INTRAVENOUS | Status: AC
Start: 2021-04-10 — End: 2021-04-10
  Filled 2021-04-10: qty 250

## 2021-04-10 MED ORDER — SODIUM CHLORIDE 0.9 % IV SOLN
200.0000 mg | Freq: Once | INTRAVENOUS | Status: AC
  Administered 2021-04-10: 200 mg via INTRAVENOUS
  Filled 2021-04-10: qty 8

## 2021-04-10 NOTE — Patient Instructions (Signed)
[facility] MEDICAL [facility]COLOGY  Discharge Instructions: ?Thank you for choosing [facility] to provide your oncology and hematology care.  ? ?If you have a lab appointment with the Cancer Center, please go directly to the Cancer Center and check in at the registration area. ?  ?Wear comfortable clothing and clothing appropriate for easy access to any Portacath or PICC line.  ? ?We strive to give you quality time with your provider. You may need to reschedule your appointment if you arrive late (15 or more minutes).  Arriving late affects you and other patients whose appointments are after yours.  Also, if you miss three or more appointments without notifying the office, you may be dismissed from the clinic at the provider?s discretion.    ?  ?For prescription refill requests, have your pharmacy contact our office and allow 72 hours for refills to be completed.   ? ?Today you received the following chemotherapy and/or immunotherapy agents: Keytruda ?  ?To help prevent nausea and vomiting after your treatment, we encourage you to take your nausea medication as directed. ? ?BELOW ARE SYMPTOMS THAT SHOULD BE REPORTED IMMEDIATELY: ?*FEVER GREATER THAN 100.4 F (38 ?C) OR HIGHER ?*CHILLS OR SWEATING ?*NAUSEA AND VOMITING THAT IS NOT CONTROLLED WITH YOUR NAUSEA MEDICATION ?*UNUSUAL SHORTNESS OF BREATH ?*UNUSUAL BRUISING OR BLEEDING ?*URINARY PROBLEMS (pain or burning when urinating, or frequent urination) ?*BOWEL PROBLEMS (unusual diarrhea, constipation, pain near the anus) ?TENDERNESS IN MOUTH AND THROAT WITH OR WITHOUT PRESENCE OF ULCERS (sore throat, sores in mouth, or a toothache) ?UNUSUAL RASH, SWELLING OR PAIN  ?UNUSUAL VAGINAL DISCHARGE OR ITCHING  ? ?Items with * indicate a potential emergency and should be followed up as soon as possible or go to the Emergency Department if any problems should occur. ? ?Please show the CHEMOTHERAPY ALERT CARD or IMMUNOTHERAPY ALERT CARD at check-in to the  Emergency Department and triage nurse. ? ?Should you have questions after your visit or need to cancel or reschedule your appointment, please contact [facility]  Dept: [phone]  and follow the prompts.  Office hours are 8:00 a.m. to 4:30 p.m. Monday - Friday. Please note that voicemails left after 4:00 p.m. may not be returned until the following business day.  We are closed weekends and major holidays. You have access to a nurse at all times for urgent questions. Please call the main number to the clinic Dept: [phone] and follow the prompts. ? ? ?For any non-urgent questions, you may also contact your provider using MyChart. We now offer e-Visits for anyone 18 and older to request care online for non-urgent symptoms. For details visit [email or web address]. ?  ?Also download the MyChart app! Go to the app store, search "MyChart", open the app, select [facility], and log in with your MyChart username and password. ? ?Due to Covid, a mask is required upon entering the hospital/clinic. If you do not have a mask, one will be given to you upon arrival. For doctor visits, patients may have 1 support person aged 18 or older with them. For treatment visits, patients cannot have anyone with them due to current Covid guidelines and our immunocompromised population.  ? ?

## 2021-04-10 NOTE — Progress Notes (Signed)
[place] Telephone:(336) [phone]   Fax:(336) [place] [address]  DIAGNOSIS: Stage IIIC non-small cell lung cancer, squamous cell carcinoma.  He presented with a right upper lobe mass and precardial lymphadenopathy and ipsilateral and contralateral hilar lymphadenopathy.  He was diagnosed in April 2021  PRIOR THERAPY: 1) Weekly concurrent chemoradiation with carboplatin for an AUC of 2 and paclitaxel 45 mg per metered squared.Status post6cycles.Taxol discontinued from treatment plan starting from cycle #3 due to adverse reaction.Last dose of chemotherapy 06/11/2020. Last day of radiation 06/14/2020. 2)  Consolidation immunotherapy with Imfinzi 1500 mg IV every 4 weeks.  First dose expected on 07/18/20.  Status post 3 cycles.  CURRENT THERAPY:  First-line systemic chemotherapy with carboplatin for AUC of 5, Abraxane 125 mg/M2 and Keytruda 200 mg IV every 3 weeks with Neulasta support.  1st dose January 16, 2021.  Status post 4 cycles.  He did not receive Abraxane with the first cycle.  Starting from cycle #5 he will be on treatment with maintenance Keytruda 200 Mg IV every 3 weeks.  INTERVAL HISTORY: George [name] 80 y.o. male returns to the clinic today for follow-up visit.  The patient is feeling fine today with no concerning complaints except for the baseline shortness of breath and mild cough.  He denied having any chest pain or hemoptysis.  He denied having any recent weight loss or night sweats.  He has no nausea, vomiting, diarrhea or constipation.  He denied having any headache or visual changes.  He tolerated the last cycle of his treatment fairly well.  The patient is here today for evaluation before starting the first cycle of his treatment with maintenance Keytruda every 3 weeks.  MEDICAL HISTORY: Past Medical History:  Diagnosis Date  . Cancer [facility])  Prostate  . History of radiation therapy 04/30/2020-06/14/2020   IMRT to right lung     Dr Gery Pray  . Hypertension   . Lung cancer ([place])     ALLERGIES:  is allergic to paclitaxel and aspirin.  MEDICATIONS:  Current Outpatient Medications  Medication Sig Dispense Refill  . acetaminophen (TYLENOL) 500 MG tablet Take 500 mg by mouth every 6 (six) hours as needed for moderate pain.    Marland Kitchen albuterol (VENTOLIN HFA) 108 (90 Base) MCG/ACT inhaler INHALE 2 PUFFS BY MOUTH FOUR TIMES A DAY AS NEEDED    . ascorbic acid (VITAMIN C) 500 MG tablet TAKE ONE TABLET BY MOUTH MONDAY, WEDNESDAY AND FRIDAY  WITH FERROUS GLUCONATE    . benzonatate (TESSALON) 100 MG capsule Take 1 capsule by mouth 3 (three) times daily as needed.    . Cholecalciferol 50 MCG (2000 UT) TABS Take 2 tablets by mouth daily. (Patient not taking: Reported on 04/09/2021)    . Ensure Plus (ENSURE PLUS) LIQD TAKE 1 CAN BY MOUTH THREE TIMES A DAY    . ferrous gluconate (FERGON) 324 MG tablet TAKE ONE TABLET BY MOUTH MONDAY, WEDNESDAY AND FRIDAY    . HYDROcodone-homatropine (HYCODAN) 5-1.5 MG/5ML syrup Take 5 mLs by mouth every 6 (six) hours as needed for cough. (Patient not taking: Reported on 04/09/2021) 120 mL 0  . levothyroxine (SYNTHROID) 75 MCG tablet TAKE 1 TABLET BY MOUTH DAILY BEFORE BREAKFAST. 30 tablet 1  . Nutritional Supplements (ENSURE ACTIVE PO) TAKE 1 CAN BY MOUTH THREE TIMES A DAY    . potassium chloride (MICRO-K) 10 MEQ  CR capsule Take 1 capsule by mouth daily.    . prochlorperazine (COMPAZINE) 10 MG tablet Take 1 tablet (10 mg total) by mouth every 6 (six) hours as needed. 30 tablet 2  . sucralfate (CARAFATE) 1 g tablet Take 1 tablet (1 g total) by mouth 4 (four) times daily -  with meals and at bedtime. Crush and dissolve in 15 mL of warm water prior to swallowing 60 tablet 1  . vitamin B-12 (CYANOCOBALAMIN) 500 MCG tablet Take 2 tablets by mouth daily.     No current facility-administered medications for this visit.     SURGICAL HISTORY: No past surgical history on file.  REVIEW OF SYSTEMS:  A comprehensive review of systems was negative except for: Constitutional: positive for fatigue Respiratory: positive for cough and dyspnea on exertion   PHYSICAL EXAMINATION: General appearance: alert, cooperative, fatigued and no distress Head: Normocephalic, without obvious abnormality, atraumatic Neck: no adenopathy, no JVD, supple, symmetrical, trachea midline and thyroid not enlarged, symmetric, no tenderness/mass/nodules Lymph nodes: Cervical, supraclavicular, and axillary nodes normal. Resp: clear to auscultation bilaterally Back: symmetric, no curvature. ROM normal. No CVA tenderness. Cardio: regular rate and rhythm, S1, S2 normal, no murmur, click, rub or gallop GI: soft, non-tender; bowel sounds normal; no masses,  no organomegaly Extremities: extremities normal, atraumatic, no cyanosis or edema  ECOG PERFORMANCE STATUS: 1 - Symptomatic but completely ambulatory  Blood pressure 114/86, pulse (!) 110, temperature 97.7 F (36.5 C), temperature source Oral, resp. rate (!) 22, weight 95 lb 0.9 oz (43.1 kg), SpO2 94 %.  LABORATORY DATA: Lab Results  Component Value Date   WBC 8.9 04/10/2021   HGB 11.1 (L) 04/10/2021   HCT 35.2 (L) 04/10/2021   MCV 96.2 04/10/2021   PLT 151 04/10/2021      Chemistry      Component Value Date/Time   NA 146 (H) 04/03/2021 1159   K 3.8 04/03/2021 1159   CL 103 04/03/2021 1159   CO2 31 04/03/2021 1159   BUN 8 04/03/2021 1159   CREATININE 0.98 04/03/2021 1159      Component Value Date/Time   CALCIUM 8.5 (L) 04/03/2021 1159   ALKPHOS 95 04/03/2021 1159   AST 12 (L) 04/03/2021 1159   ALT <6 04/03/2021 1159   BILITOT 0.6 04/03/2021 1159       RADIOGRAPHIC STUDIES: CT Chest W Contrast  Result Date: 03/19/2021 CLINICAL DATA:  Primary Cancer Type: Lung Imaging Indication: Assess response to therapy Interval therapy since last imaging? Yes Initial Cancer  Diagnosis Date: 03/07/2020; Established by: Biopsy-proven Detailed Pathology: Stage IIIC non-small cell lung cancer, squamous cell carcinoma. Primary Tumor location: Right upper lobe. Surgeries: No. Chemotherapy: Yes; Ongoing? Yes; Most recent administration: 02/27/2021 Immunotherapy?  Yes; Type: Keytruda; Ongoing? Yes Radiation therapy? Yes; Date Range: 04/30/2020 - 06/14/2020; Target: Right lung Other Cancers: Prostate cancer 2012, with external beam radiation as well as radiation seed implants. EXAM: CT CHEST, ABDOMEN, AND PELVIS WITH CONTRAST TECHNIQUE: Multidetector CT imaging of the chest, abdomen and pelvis was performed following the standard protocol during bolus administration of intravenous contrast. CONTRAST:  161mL OMNIPAQUE IOHEXOL 300 MG/ML  SOLN COMPARISON:  Most recent CT chest 01/06/2021.  03/14/2020 PET-CT. FINDINGS: CT CHEST FINDINGS Cardiovascular: Coronary artery calcification and aortic atherosclerotic calcification. Mediastinum/Nodes: No axillary supraclavicular adenopathy no mediastinal hilar adenopathy no pericardial effusion. Esophagus normal. Enhancing precarinal node measures 8 mm unchanged Lungs/Pleura: Enhancing mass in the anterior RIGHT upper lobe measures 3.7 by 3.4 cm compared with 3.2 by 2.7 cm measures  at a similar level (image 31/series 2). The enhancing lesion extends into the intracostal space of the anterior chest wall (image 33/2). The portion extending to the chest wall measures 12 mm compared to 7 mm. Elsewhere in the lungs, no suspicious nodularity. Extensive centrilobular emphysema noted. Musculoskeletal: Compression deformity in the midthoracic spine unchanged. Remote fracture of the proximal RIGHT humerus. CT ABDOMEN AND PELVIS FINDINGS Hepatobiliary: No focal hepatic lesion. No biliary ductal dilatation. Gallbladder is normal. Common bile duct is normal. Pancreas: Pancreas is normal. No ductal dilatation. No pancreatic inflammation. Spleen: Normal spleen  Adrenals/urinary tract: Adrenal glands and kidneys are normal. The ureters and bladder normal. Stomach/Bowel: Stomach, small bowel, appendix, and cecum are normal. The colon and rectosigmoid colon are normal. Vascular/Lymphatic: Abdominal aorta is normal caliber with atherosclerotic calcification. There is no retroperitoneal or periportal lymphadenopathy. No pelvic lymphadenopathy. Reproductive: Brachytherapy seeds in the prostate gland Other: And no peritoneal metastasis Musculoskeletal: No metastatic disease in the pelvis or lumbar spine IMPRESSION: Chest Impression: 1. Mild increase in volume of RIGHT upper lobe mass with mild progressive extension into the anterior chest wall. 2. No mediastinal adenopathy. Abdomen / Pelvis Impression: 1. No visceral metastasis in the abdomen pelvis. 2. No abdominopelvic adenopathy. 3. No clear evidence skeletal metastasis. Electronically Signed   By: Suzy Bouchard M.D.   On: 03/19/2021 11:07   CT Abdomen Pelvis W Contrast  Result Date: 03/19/2021 CLINICAL DATA:  Primary Cancer Type: Lung Imaging Indication: Assess response to therapy Interval therapy since last imaging? Yes Initial Cancer Diagnosis Date: 03/07/2020; Established by: Biopsy-proven Detailed Pathology: Stage IIIC non-small cell lung cancer, squamous cell carcinoma. Primary Tumor location: Right upper lobe. Surgeries: No. Chemotherapy: Yes; Ongoing? Yes; Most recent administration: 02/27/2021 Immunotherapy?  Yes; Type: Keytruda; Ongoing? Yes Radiation therapy? Yes; Date Range: 04/30/2020 - 06/14/2020; Target: Right lung Other Cancers: Prostate cancer 2012, with external beam radiation as well as radiation seed implants. EXAM: CT CHEST, ABDOMEN, AND PELVIS WITH CONTRAST TECHNIQUE: Multidetector CT imaging of the chest, abdomen and pelvis was performed following the standard protocol during bolus administration of intravenous contrast. CONTRAST:  19mL OMNIPAQUE IOHEXOL 300 MG/ML  SOLN COMPARISON:  Most recent  CT chest 01/06/2021.  03/14/2020 PET-CT. FINDINGS: CT CHEST FINDINGS Cardiovascular: Coronary artery calcification and aortic atherosclerotic calcification. Mediastinum/Nodes: No axillary supraclavicular adenopathy no mediastinal hilar adenopathy no pericardial effusion. Esophagus normal. Enhancing precarinal node measures 8 mm unchanged Lungs/Pleura: Enhancing mass in the anterior RIGHT upper lobe measures 3.7 by 3.4 cm compared with 3.2 by 2.7 cm measures at a similar level (image 31/series 2). The enhancing lesion extends into the intracostal space of the anterior chest wall (image 33/2). The portion extending to the chest wall measures 12 mm compared to 7 mm. Elsewhere in the lungs, no suspicious nodularity. Extensive centrilobular emphysema noted. Musculoskeletal: Compression deformity in the midthoracic spine unchanged. Remote fracture of the proximal RIGHT humerus. CT ABDOMEN AND PELVIS FINDINGS Hepatobiliary: No focal hepatic lesion. No biliary ductal dilatation. Gallbladder is normal. Common bile duct is normal. Pancreas: Pancreas is normal. No ductal dilatation. No pancreatic inflammation. Spleen: Normal spleen Adrenals/urinary tract: Adrenal glands and kidneys are normal. The ureters and bladder normal. Stomach/Bowel: Stomach, small bowel, appendix, and cecum are normal. The colon and rectosigmoid colon are normal. Vascular/Lymphatic: Abdominal aorta is normal caliber with atherosclerotic calcification. There is no retroperitoneal or periportal lymphadenopathy. No pelvic lymphadenopathy. Reproductive: Brachytherapy seeds in the prostate gland Other: And no peritoneal metastasis Musculoskeletal: No metastatic disease in the pelvis or lumbar spine IMPRESSION:  Chest Impression: 1. Mild increase in volume of RIGHT upper lobe mass with mild progressive extension into the anterior chest wall. 2. No mediastinal adenopathy. Abdomen / Pelvis Impression: 1. No visceral metastasis in the abdomen pelvis. 2. No  abdominopelvic adenopathy. 3. No clear evidence skeletal metastasis. Electronically Signed   By: Suzy Bouchard M.D.   On: 03/19/2021 11:07    ASSESSMENT AND PLAN: This is a very pleasant 80 years old African-American male recently diagnosed with stage IIIc non-small cell lung cancer, squamous cell carcinoma presented with right upper lobe lung mass in addition to ipsilateral and contralateral hilar lymphadenopathy diagnosed in April 2021. Status post a course of concurrent chemoradiation with weekly carboplatin and paclitaxel for 6 cycles with partial response. The patient is currently undergoing consolidation treatment with immunotherapy with Imfinzi 1500 mg IV every 4 weeks status post 3 cycles.   The patient has been tolerating this treatment well with no concerning adverse effects. He had repeat CT scan of the chest performed recently.  I personally and independently reviewed the scan images and discussed the results with the patient today. Unfortunately his scan showed interval progression of the enhancing tumor within the anterior basal right upper lobe was concern for progressive chest wall involvement.  He continues to have stable borderline enlarged right hilar and right paratracheal lymph nodes. He started systemic chemotherapy with a regimen of carboplatin and Keytruda 3 weeks ago.  He was supposed to receive paclitaxel but because of the hypersensitivity reaction he had in the past this was held.  He tolerated the first cycle of his treatment fairly well with no concerning adverse effects. He underwent treatment with carboplatin, Abraxane and Keytruda status post 3 more cycles and has been tolerating this treatment well. Starting from cycle #5 the patient will be on maintenance treatment with single agent Keytruda every 3 weeks. I recommended for the patient to proceed with cycle #5 today as planned. For the dry cough, I gave him prescription for Hycodan every 6 hours as needed. He will  come back for follow-up visit in 3 weeks for evaluation before the next cycle of his treatment. He was advised to call immediately if he has any concerning symptoms in the interval. The patient voices understanding of current disease status and treatment options and is in agreement with the current care plan. All questions were answered. The patient knows to call the clinic with any problems, questions or concerns. We can certainly see the patient much sooner if necessary.  Disclaimer: This note was dictated with voice recognition software. Similar sounding words can inadvertently be transcribed and may not be corrected upon review.

## 2021-04-10 NOTE — Patient Instructions (Signed)
Steps to Quit Smoking Smoking tobacco is the leading cause of preventable death. It can affect almost every organ in the body. Smoking puts you and people around you at risk for many serious, long-lasting (chronic) diseases. Quitting smoking can be hard, but it is one of the best things that you can do for your health. It is never too late to quit. How do I get ready to quit? When you decide to quit smoking, make a plan to help you succeed. Before you quit:  Pick a date to quit. Set a date within the next 2 weeks to give you time to prepare.  Write down the reasons why you are quitting. Keep this list in places where you will see it often.  Tell your family, friends, and co-workers that you are quitting. Their support is important.  Talk with your doctor about the choices that may help you quit.  Find out if your health insurance will pay for these treatments.  Know the people, places, things, and activities that make you want to smoke (triggers). Avoid them. What first steps can I take to quit smoking?  Throw away all cigarettes at home, at work, and in your car.  Throw away the things that you use when you smoke, such as ashtrays and lighters.  Clean your car. Make sure to empty the ashtray.  Clean your home, including curtains and carpets. What can I do to help me quit smoking? Talk with your doctor about taking medicines and seeing a counselor at the same time. You are more likely to succeed when you do both.  If you are pregnant or breastfeeding, talk with your doctor about counseling or other ways to quit smoking. Do not take medicine to help you quit smoking unless your doctor tells you to do so. To quit smoking: Quit right away  Quit smoking totally, instead of slowly cutting back on how much you smoke over a period of time.  Go to counseling. You are more likely to quit if you go to counseling sessions regularly. Take medicine You may take medicines to help you quit. Some  medicines need a prescription, and some you can buy over-the-counter. Some medicines may contain a drug called nicotine to replace the nicotine in cigarettes. Medicines may:  Help you to stop having the desire to smoke (cravings).  Help to stop the problems that come when you stop smoking (withdrawal symptoms). Your doctor may ask you to use:  Nicotine patches, gum, or lozenges.  Nicotine inhalers or sprays.  Non-nicotine medicine that is taken by mouth. Find resources Find resources and other ways to help you quit smoking and remain smoke-free after you quit. These resources are most helpful when you use them often. They include:  Online chats with a counselor.  Phone quitlines.  Printed self-help materials.  Support groups or group counseling.  Text messaging programs.  Mobile phone apps. Use apps on your mobile phone or tablet that can help you stick to your quit plan. There are many free apps for mobile phones and tablets as well as websites. Examples include Quit Guide from the CDC and smokefree.gov   What things can I do to make it easier to quit?  Talk to your family and friends. Ask them to support and encourage you.  Call a phone quitline (1-800-QUIT-NOW), reach out to support groups, or work with a counselor.  Ask people who smoke to not smoke around you.  Avoid places that make you want to smoke,   such as: ? Bars. ? Parties. ? Smoke-break areas at work.  Spend time with people who do not smoke.  Lower the stress in your life. Stress can make you want to smoke. Try these things to help your stress: ? Getting regular exercise. ? Doing deep-breathing exercises. ? Doing yoga. ? Meditating. ? Doing a body scan. To do this, close your eyes, focus on one area of your body at a time from head to toe. Notice which parts of your body are tense. Try to relax the muscles in those areas.   How will I feel when I quit smoking? Day 1 to 3 weeks Within the first 24 hours,  you may start to have some problems that come from quitting tobacco. These problems are very bad 2-3 days after you quit, but they do not often last for more than 2-3 weeks. You may get these symptoms:  Mood swings.  Feeling restless, nervous, angry, or annoyed.  Trouble concentrating.  Dizziness.  Strong desire for high-sugar foods and nicotine.  Weight gain.  Trouble pooping (constipation).  Feeling like you may vomit (nausea).  Coughing or a sore throat.  Changes in how the medicines that you take for other issues work in your body.  Depression.  Trouble sleeping (insomnia). Week 3 and afterward After the first 2-3 weeks of quitting, you may start to notice more positive results, such as:  Better sense of smell and taste.  Less coughing and sore throat.  Slower heart rate.  Lower blood pressure.  Clearer skin.  Better breathing.  Fewer sick days. Quitting smoking can be hard. Do not give up if you fail the first time. Some people need to try a few times before they succeed. Do your best to stick to your quit plan, and talk with your doctor if you have any questions or concerns. Summary  Smoking tobacco is the leading cause of preventable death. Quitting smoking can be hard, but it is one of the best things that you can do for your health.  When you decide to quit smoking, make a plan to help you succeed.  Quit smoking right away, not slowly over a period of time.  When you start quitting, seek help from your doctor, family, or friends. This information is not intended to replace advice given to you by your health care provider. Make sure you discuss any questions you have with your health care provider. Document Revised: 08/18/2019 Document Reviewed: 02/11/2019 Elsevier Patient Education  2021 Elsevier Inc.  

## 2021-04-11 ENCOUNTER — Telehealth: Payer: Self-pay

## 2021-04-11 ENCOUNTER — Telehealth: Payer: Self-pay | Admitting: Internal Medicine

## 2021-04-11 NOTE — Telephone Encounter (Signed)
Called Mr. Novack to let him know he did not need to come for an injection tomorrow since he is only getting Beryle Flock now.  He understands and his appt was cancelled. Gardiner Rhyme, RN

## 2021-04-11 NOTE — Telephone Encounter (Signed)
Scheduled per los. Called and left msg. Mailed printout  °

## 2021-04-12 ENCOUNTER — Inpatient Hospital Stay: Payer: No Typology Code available for payment source

## 2021-04-16 ENCOUNTER — Encounter: Payer: Self-pay | Admitting: Internal Medicine

## 2021-04-16 ENCOUNTER — Telehealth: Payer: Self-pay

## 2021-04-16 DIAGNOSIS — E039 Hypothyroidism, unspecified: Secondary | ICD-10-CM

## 2021-04-16 MED ORDER — LEVOTHYROXINE SODIUM 75 MCG PO TABS: 75.0000 ug | ORAL_TABLET | Freq: Every day | ORAL | 0 refills | Status: AC

## 2021-04-16 NOTE — Telephone Encounter (Signed)
Fax received from [facility] requesting Synthroid be sent as a 90 day supply. Per Vito Backers, PA-C this is authorized to do.

## 2021-04-17 ENCOUNTER — Inpatient Hospital Stay: Payer: No Typology Code available for payment source

## 2021-04-17 ENCOUNTER — Other Ambulatory Visit: Payer: Self-pay

## 2021-04-17 DIAGNOSIS — C3411 Malignant neoplasm of upper lobe, right bronchus or lung: Secondary | ICD-10-CM

## 2021-04-17 DIAGNOSIS — Z5112 Encounter for antineoplastic immunotherapy: Secondary | ICD-10-CM | POA: Diagnosis not present

## 2021-04-17 LAB — CMP (CANCER CENTER ONLY)
ALT: <6 U/L (ref 0–44)
AST: 12 U/L — ABNORMAL LOW (ref 15–41)
Albumin: 3.1 g/dL — ABNORMAL LOW (ref 3.5–5.0)
Alkaline Phosphatase: 77 U/L (ref 38–126)
Anion gap: 11 (ref 5–15)
BUN: 8 mg/dL (ref 8–23)
CO2: 31 mmol/L (ref 22–32)
Calcium: 8.6 mg/dL — ABNORMAL LOW (ref 8.9–10.3)
Chloride: 101 mmol/L (ref 98–111)
Creatinine: 1.02 mg/dL (ref 0.61–1.24)
GFR, Estimated: >60 mL/min (ref >60)
Glucose, Bld: 147 mg/dL — ABNORMAL HIGH (ref 70–99)
Potassium: 3 mmol/L — ABNORMAL LOW (ref 3.5–5.1)
Sodium: 143 mmol/L (ref 135–145)
Total Bilirubin: 0.6 mg/dL (ref 0.3–1.2)
Total Protein: 7.1 g/dL (ref 6.5–8.1)

## 2021-04-17 LAB — CBC WITH DIFFERENTIAL (CANCER CENTER ONLY)
Abs Immature Granulocytes: 0.03 10*3/uL (ref 0.00–0.07)
Basophils Absolute: 0 10*3/uL (ref 0.0–0.1)
Basophils Relative: 0 %
Eosinophils Absolute: 0 10*3/uL (ref 0.0–0.5)
Eosinophils Relative: 0 %
HCT: 36.6 % — ABNORMAL LOW (ref 39.0–52.0)
Hemoglobin: 11.5 g/dL — ABNORMAL LOW (ref 13.0–17.0)
Immature Granulocytes: 1 %
Lymphocytes Relative: 12 %
Lymphs Abs: 0.8 10*3/uL (ref 0.7–4.0)
MCH: 30.8 pg (ref 26.0–34.0)
MCHC: 31.4 g/dL (ref 30.0–36.0)
MCV: 98.1 fL (ref 80.0–100.0)
Monocytes Absolute: 0.6 10*3/uL (ref 0.1–1.0)
Monocytes Relative: 9 %
Neutro Abs: 5.1 10*3/uL (ref 1.7–7.7)
Neutrophils Relative %: 78 %
Platelet Count: 145 10*3/uL — ABNORMAL LOW (ref 150–400)
RBC: 3.73 MIL/uL — ABNORMAL LOW (ref 4.22–5.81)
RDW: 18.2 % — ABNORMAL HIGH (ref 11.5–15.5)
WBC Count: 6.5 10*3/uL (ref 4.0–10.5)
nRBC: 0 % (ref 0.0–0.2)

## 2021-04-24 ENCOUNTER — Inpatient Hospital Stay: Payer: No Typology Code available for payment source

## 2021-04-24 ENCOUNTER — Other Ambulatory Visit: Payer: Self-pay

## 2021-04-24 DIAGNOSIS — Z5112 Encounter for antineoplastic immunotherapy: Secondary | ICD-10-CM | POA: Diagnosis not present

## 2021-04-24 DIAGNOSIS — C3411 Malignant neoplasm of upper lobe, right bronchus or lung: Secondary | ICD-10-CM

## 2021-04-24 LAB — CMP (CANCER CENTER ONLY)
ALT: <6 U/L (ref 0–44)
AST: 13 U/L — ABNORMAL LOW (ref 15–41)
Albumin: 3.1 g/dL — ABNORMAL LOW (ref 3.5–5.0)
Alkaline Phosphatase: 69 U/L (ref 38–126)
Anion gap: 13 (ref 5–15)
BUN: 11 mg/dL (ref 8–23)
CO2: 30 mmol/L (ref 22–32)
Calcium: 8.9 mg/dL (ref 8.9–10.3)
Chloride: 101 mmol/L (ref 98–111)
Creatinine: 0.96 mg/dL (ref 0.61–1.24)
GFR, Estimated: >60 mL/min (ref >60)
Glucose, Bld: 119 mg/dL — ABNORMAL HIGH (ref 70–99)
Potassium: 3.4 mmol/L — ABNORMAL LOW (ref 3.5–5.1)
Sodium: 144 mmol/L (ref 135–145)
Total Bilirubin: 0.6 mg/dL (ref 0.3–1.2)
Total Protein: 7.4 g/dL (ref 6.5–8.1)

## 2021-04-24 LAB — CBC WITH DIFFERENTIAL (CANCER CENTER ONLY)
Abs Immature Granulocytes: 0.01 10*3/uL (ref 0.00–0.07)
Basophils Absolute: 0 10*3/uL (ref 0.0–0.1)
Basophils Relative: 0 %
Eosinophils Absolute: 0 10*3/uL (ref 0.0–0.5)
Eosinophils Relative: 0 %
HCT: 39.7 % (ref 39.0–52.0)
Hemoglobin: 12.4 g/dL — ABNORMAL LOW (ref 13.0–17.0)
Immature Granulocytes: 0 %
Lymphocytes Relative: 19 %
Lymphs Abs: 0.8 10*3/uL (ref 0.7–4.0)
MCH: 30.3 pg (ref 26.0–34.0)
MCHC: 31.2 g/dL (ref 30.0–36.0)
MCV: 97.1 fL (ref 80.0–100.0)
Monocytes Absolute: 0.6 10*3/uL (ref 0.1–1.0)
Monocytes Relative: 13 %
Neutro Abs: 3 10*3/uL (ref 1.7–7.7)
Neutrophils Relative %: 68 %
Platelet Count: 173 10*3/uL (ref 150–400)
RBC: 4.09 MIL/uL — ABNORMAL LOW (ref 4.22–5.81)
RDW: 16.6 % — ABNORMAL HIGH (ref 11.5–15.5)
WBC Count: 4.5 10*3/uL (ref 4.0–10.5)
nRBC: 0 % (ref 0.0–0.2)

## 2021-05-01 ENCOUNTER — Inpatient Hospital Stay (HOSPITAL_BASED_OUTPATIENT_CLINIC_OR_DEPARTMENT_OTHER): Payer: No Typology Code available for payment source | Admitting: Internal Medicine

## 2021-05-01 ENCOUNTER — Inpatient Hospital Stay: Payer: No Typology Code available for payment source

## 2021-05-01 ENCOUNTER — Emergency Department (HOSPITAL_COMMUNITY): Payer: No Typology Code available for payment source

## 2021-05-01 ENCOUNTER — Encounter: Payer: Self-pay | Admitting: Internal Medicine

## 2021-05-01 ENCOUNTER — Inpatient Hospital Stay (HOSPITAL_COMMUNITY)
Admission: EM | Admit: 2021-05-01 | Discharge: 2021-05-05 | DRG: 190 | Disposition: A | Discharge status: Home Health | Payer: No Typology Code available for payment source | Attending: Internal Medicine | Admitting: Internal Medicine

## 2021-05-01 ENCOUNTER — Other Ambulatory Visit: Payer: Self-pay

## 2021-05-01 ENCOUNTER — Other Ambulatory Visit: Payer: Self-pay | Admitting: Medical Oncology

## 2021-05-01 VITALS — HR 100

## 2021-05-01 DIAGNOSIS — Z681 Body mass index (BMI) 19 or less, adult: Secondary | ICD-10-CM

## 2021-05-01 DIAGNOSIS — Z8546 Personal history of malignant neoplasm of prostate: Secondary | ICD-10-CM

## 2021-05-01 DIAGNOSIS — I10 Essential (primary) hypertension: Secondary | ICD-10-CM | POA: Diagnosis present

## 2021-05-01 DIAGNOSIS — C3490 Malignant neoplasm of unspecified part of unspecified bronchus or lung: Secondary | ICD-10-CM | POA: Diagnosis not present

## 2021-05-01 DIAGNOSIS — C3411 Malignant neoplasm of upper lobe, right bronchus or lung: Secondary | ICD-10-CM

## 2021-05-01 DIAGNOSIS — D649 Anemia, unspecified: Secondary | ICD-10-CM | POA: Diagnosis present

## 2021-05-01 DIAGNOSIS — Z923 Personal history of irradiation: Secondary | ICD-10-CM

## 2021-05-01 DIAGNOSIS — Z9221 Personal history of antineoplastic chemotherapy: Secondary | ICD-10-CM

## 2021-05-01 DIAGNOSIS — Z20822 Contact with and (suspected) exposure to covid-19: Secondary | ICD-10-CM | POA: Diagnosis present

## 2021-05-01 DIAGNOSIS — Z79890 Hormone replacement therapy (postmenopausal): Secondary | ICD-10-CM

## 2021-05-01 DIAGNOSIS — E039 Hypothyroidism, unspecified: Secondary | ICD-10-CM | POA: Diagnosis present

## 2021-05-01 DIAGNOSIS — E876 Hypokalemia: Secondary | ICD-10-CM

## 2021-05-01 DIAGNOSIS — J441 Chronic obstructive pulmonary disease with (acute) exacerbation: Principal | ICD-10-CM | POA: Diagnosis present

## 2021-05-01 DIAGNOSIS — Z79899 Other long term (current) drug therapy: Secondary | ICD-10-CM

## 2021-05-01 DIAGNOSIS — F17210 Nicotine dependence, cigarettes, uncomplicated: Secondary | ICD-10-CM | POA: Diagnosis present

## 2021-05-01 DIAGNOSIS — C3491 Malignant neoplasm of unspecified part of right bronchus or lung: Secondary | ICD-10-CM | POA: Diagnosis present

## 2021-05-01 DIAGNOSIS — T380X5A Adverse effect of glucocorticoids and synthetic analogues, initial encounter: Secondary | ICD-10-CM | POA: Diagnosis present

## 2021-05-01 DIAGNOSIS — E43 Unspecified severe protein-calorie malnutrition: Secondary | ICD-10-CM | POA: Diagnosis present

## 2021-05-01 DIAGNOSIS — R739 Hyperglycemia, unspecified: Secondary | ICD-10-CM | POA: Diagnosis present

## 2021-05-01 LAB — COMPREHENSIVE METABOLIC PANEL
ALT: 8 U/L (ref 0–44)
AST: 19 U/L (ref 15–41)
Albumin: 3.3 g/dL — ABNORMAL LOW (ref 3.5–5.0)
Alkaline Phosphatase: 57 U/L (ref 38–126)
Anion gap: 10 (ref 5–15)
BUN: 11 mg/dL (ref 8–23)
CO2: 32 mmol/L (ref 22–32)
Calcium: 9 mg/dL (ref 8.9–10.3)
Chloride: 101 mmol/L (ref 98–111)
Creatinine, Ser: 1.07 mg/dL (ref 0.61–1.24)
GFR, Estimated: >60 mL/min (ref >60)
Glucose, Bld: 186 mg/dL — ABNORMAL HIGH (ref 70–99)
Potassium: 4 mmol/L (ref 3.5–5.1)
Sodium: 143 mmol/L (ref 135–145)
Total Bilirubin: 0.7 mg/dL (ref 0.3–1.2)
Total Protein: 7.6 g/dL (ref 6.5–8.1)

## 2021-05-01 LAB — CBC WITH DIFFERENTIAL/PLATELET
Abs Immature Granulocytes: 0.02 10*3/uL (ref 0.00–0.07)
Basophils Absolute: 0 10*3/uL (ref 0.0–0.1)
Basophils Relative: 0 %
Eosinophils Absolute: 0 10*3/uL (ref 0.0–0.5)
Eosinophils Relative: 0 %
HCT: 37.6 % — ABNORMAL LOW (ref 39.0–52.0)
Hemoglobin: 11.5 g/dL — ABNORMAL LOW (ref 13.0–17.0)
Immature Granulocytes: 1 %
Lymphocytes Relative: 13 %
Lymphs Abs: 0.6 10*3/uL — ABNORMAL LOW (ref 0.7–4.0)
MCH: 30.9 pg (ref 26.0–34.0)
MCHC: 30.6 g/dL (ref 30.0–36.0)
MCV: 101.1 fL — ABNORMAL HIGH (ref 80.0–100.0)
Monocytes Absolute: 0.3 10*3/uL (ref 0.1–1.0)
Monocytes Relative: 7 %
Neutro Abs: 3.4 10*3/uL (ref 1.7–7.7)
Neutrophils Relative %: 79 %
Platelets: 138 10*3/uL — ABNORMAL LOW (ref 150–400)
RBC: 3.72 MIL/uL — ABNORMAL LOW (ref 4.22–5.81)
RDW: 15.8 % — ABNORMAL HIGH (ref 11.5–15.5)
WBC: 4.3 10*3/uL (ref 4.0–10.5)
nRBC: 0 % (ref 0.0–0.2)

## 2021-05-01 LAB — CBC WITH DIFFERENTIAL (CANCER CENTER ONLY)
Abs Immature Granulocytes: 0.01 10*3/uL (ref 0.00–0.07)
Basophils Absolute: 0 10*3/uL (ref 0.0–0.1)
Basophils Relative: 0 %
Eosinophils Absolute: 0 10*3/uL (ref 0.0–0.5)
Eosinophils Relative: 0 %
HCT: 35.2 % — ABNORMAL LOW (ref 39.0–52.0)
Hemoglobin: 11.3 g/dL — ABNORMAL LOW (ref 13.0–17.0)
Immature Granulocytes: 0 %
Lymphocytes Relative: 31 %
Lymphs Abs: 1 10*3/uL (ref 0.7–4.0)
MCH: 31.3 pg (ref 26.0–34.0)
MCHC: 32.1 g/dL (ref 30.0–36.0)
MCV: 97.5 fL (ref 80.0–100.0)
Monocytes Absolute: 0.4 10*3/uL (ref 0.1–1.0)
Monocytes Relative: 12 %
Neutro Abs: 1.7 10*3/uL (ref 1.7–7.7)
Neutrophils Relative %: 57 %
Platelet Count: 144 10*3/uL — ABNORMAL LOW (ref 150–400)
RBC: 3.61 MIL/uL — ABNORMAL LOW (ref 4.22–5.81)
RDW: 15.8 % — ABNORMAL HIGH (ref 11.5–15.5)
WBC Count: 3.1 10*3/uL — ABNORMAL LOW (ref 4.0–10.5)
nRBC: 0 % (ref 0.0–0.2)

## 2021-05-01 LAB — CMP (CANCER CENTER ONLY)
ALT: <6 U/L (ref 0–44)
AST: 12 U/L — ABNORMAL LOW (ref 15–41)
Albumin: 2.9 g/dL — ABNORMAL LOW (ref 3.5–5.0)
Alkaline Phosphatase: 60 U/L (ref 38–126)
Anion gap: 10 (ref 5–15)
BUN: 9 mg/dL (ref 8–23)
CO2: 31 mmol/L (ref 22–32)
Calcium: 8.9 mg/dL (ref 8.9–10.3)
Chloride: 103 mmol/L (ref 98–111)
Creatinine: 1.07 mg/dL (ref 0.61–1.24)
GFR, Estimated: >60 mL/min (ref >60)
Glucose, Bld: 141 mg/dL — ABNORMAL HIGH (ref 70–99)
Potassium: 3 mmol/L — ABNORMAL LOW (ref 3.5–5.1)
Sodium: 144 mmol/L (ref 135–145)
Total Bilirubin: 0.5 mg/dL (ref 0.3–1.2)
Total Protein: 6.9 g/dL (ref 6.5–8.1)

## 2021-05-01 LAB — D-DIMER, QUANTITATIVE: D-Dimer, Quant: 6.39 ug/mL-FEU — ABNORMAL HIGH (ref 0.00–0.50)

## 2021-05-01 LAB — TROPONIN I (HIGH SENSITIVITY): Troponin I (High Sensitivity): 3 ng/L (ref <18)

## 2021-05-01 LAB — TSH: TSH: 56.901 u[IU]/mL — ABNORMAL HIGH (ref 0.320–4.118)

## 2021-05-01 MED ORDER — SODIUM CHLORIDE 0.9 % IV SOLN
Freq: Once | INTRAVENOUS | Status: AC
  Filled 2021-05-01: qty 250

## 2021-05-01 MED ORDER — SODIUM CHLORIDE 0.9 % IV SOLN
200.0000 mg | Freq: Once | INTRAVENOUS | Status: AC
  Administered 2021-05-01: 200 mg via INTRAVENOUS
  Filled 2021-05-01: qty 8

## 2021-05-01 MED ORDER — POTASSIUM CHLORIDE CRYS ER 20 MEQ PO TBCR: 20.0000 meq | EXTENDED_RELEASE_TABLET | Freq: Every day | ORAL | 0 refills | Status: AC

## 2021-05-01 NOTE — ED Triage Notes (Signed)
EMS from home. HX right upper lung cancer, received chemo treatment today, after which he experienced SOB, called EMS, satted 90% on RA. Given two Albuterol treatments. No coughing. Denies pain. Not on home O2.

## 2021-05-01 NOTE — Patient Instructions (Signed)
Steps to Quit Smoking Smoking tobacco is the leading cause of preventable death. It can affect almost every organ in the body. Smoking puts you and people around you at risk for many serious, long-lasting (chronic) diseases. Quitting smoking can be hard, but it is one of the best things that you can do for your health. It is never too late to quit. How do I get ready to quit? When you decide to quit smoking, make a plan to help you succeed. Before you quit:  Pick a date to quit. Set a date within the next 2 weeks to give you time to prepare.  Write down the reasons why you are quitting. Keep this list in places where you will see it often.  Tell your family, friends, and co-workers that you are quitting. Their support is important.  Talk with your doctor about the choices that may help you quit.  Find out if your health insurance will pay for these treatments.  Know the people, places, things, and activities that make you want to smoke (triggers). Avoid them. What first steps can I take to quit smoking?  Throw away all cigarettes at home, at work, and in your car.  Throw away the things that you use when you smoke, such as ashtrays and lighters.  Clean your car. Make sure to empty the ashtray.  Clean your home, including curtains and carpets. What can I do to help me quit smoking? Talk with your doctor about taking medicines and seeing a counselor at the same time. You are more likely to succeed when you do both.  If you are pregnant or breastfeeding, talk with your doctor about counseling or other ways to quit smoking. Do not take medicine to help you quit smoking unless your doctor tells you to do so. To quit smoking: Quit right away  Quit smoking totally, instead of slowly cutting back on how much you smoke over a period of time.  Go to counseling. You are more likely to quit if you go to counseling sessions regularly. Take medicine You may take medicines to help you quit. Some  medicines need a prescription, and some you can buy over-the-counter. Some medicines may contain a drug called nicotine to replace the nicotine in cigarettes. Medicines may:  Help you to stop having the desire to smoke (cravings).  Help to stop the problems that come when you stop smoking (withdrawal symptoms). Your doctor may ask you to use:  Nicotine patches, gum, or lozenges.  Nicotine inhalers or sprays.  Non-nicotine medicine that is taken by mouth. Find resources Find resources and other ways to help you quit smoking and remain smoke-free after you quit. These resources are most helpful when you use them often. They include:  Online chats with a counselor.  Phone quitlines.  Printed self-help materials.  Support groups or group counseling.  Text messaging programs.  Mobile phone apps. Use apps on your mobile phone or tablet that can help you stick to your quit plan. There are many free apps for mobile phones and tablets as well as websites. Examples include Quit Guide from the CDC and smokefree.gov   What things can I do to make it easier to quit?  Talk to your family and friends. Ask them to support and encourage you.  Call a phone quitline (1-800-QUIT-NOW), reach out to support groups, or work with a counselor.  Ask people who smoke to not smoke around you.  Avoid places that make you want to smoke,   such as: ? Bars. ? Parties. ? Smoke-break areas at work.  Spend time with people who do not smoke.  Lower the stress in your life. Stress can make you want to smoke. Try these things to help your stress: ? Getting regular exercise. ? Doing deep-breathing exercises. ? Doing yoga. ? Meditating. ? Doing a body scan. To do this, close your eyes, focus on one area of your body at a time from head to toe. Notice which parts of your body are tense. Try to relax the muscles in those areas.   How will I feel when I quit smoking? Day 1 to 3 weeks Within the first 24 hours,  you may start to have some problems that come from quitting tobacco. These problems are very bad 2-3 days after you quit, but they do not often last for more than 2-3 weeks. You may get these symptoms:  Mood swings.  Feeling restless, nervous, angry, or annoyed.  Trouble concentrating.  Dizziness.  Strong desire for high-sugar foods and nicotine.  Weight gain.  Trouble pooping (constipation).  Feeling like you may vomit (nausea).  Coughing or a sore throat.  Changes in how the medicines that you take for other issues work in your body.  Depression.  Trouble sleeping (insomnia). Week 3 and afterward After the first 2-3 weeks of quitting, you may start to notice more positive results, such as:  Better sense of smell and taste.  Less coughing and sore throat.  Slower heart rate.  Lower blood pressure.  Clearer skin.  Better breathing.  Fewer sick days. Quitting smoking can be hard. Do not give up if you fail the first time. Some people need to try a few times before they succeed. Do your best to stick to your quit plan, and talk with your doctor if you have any questions or concerns. Summary  Smoking tobacco is the leading cause of preventable death. Quitting smoking can be hard, but it is one of the best things that you can do for your health.  When you decide to quit smoking, make a plan to help you succeed.  Quit smoking right away, not slowly over a period of time.  When you start quitting, seek help from your doctor, family, or friends. This information is not intended to replace advice given to you by your health care provider. Make sure you discuss any questions you have with your health care provider. Document Revised: 08/18/2019 Document Reviewed: 02/11/2019 Elsevier Patient Education  2021 Elsevier Inc.  

## 2021-05-01 NOTE — Progress Notes (Signed)
Per Dr. Julien Nordmann ,it is ok to treat pt with Keytruda and pulse of 106.

## 2021-05-01 NOTE — Progress Notes (Signed)
[place] Telephone:(336) [phone]   Fax:(336) [place] [address]  DIAGNOSIS: Stage IIIC non-small cell lung cancer, squamous cell carcinoma.  He presented with a right upper lobe mass and precardial lymphadenopathy and ipsilateral and contralateral hilar lymphadenopathy.  He was diagnosed in April 2021  PRIOR THERAPY: 1) Weekly concurrent chemoradiation with carboplatin for an AUC of 2 and paclitaxel 45 mg per metered squared.Status post6cycles.Taxol discontinued from treatment plan starting from cycle #3 due to adverse reaction.Last dose of chemotherapy 06/11/2020. Last day of radiation 06/14/2020. 2)  Consolidation immunotherapy with Imfinzi 1500 mg IV every 4 weeks.  First dose expected on 07/18/20.  Status post 3 cycles.  CURRENT THERAPY:  First-line systemic chemotherapy with carboplatin for AUC of 5, Abraxane 125 mg/M2 and Keytruda 200 mg IV every 3 weeks with Neulasta support.  1st dose January 16, 2021.  Status post 5 cycles.  He did not receive Abraxane with the first cycle.  Starting from cycle #5 he will be on treatment with maintenance Keytruda 200 Mg IV every 3 weeks.  INTERVAL HISTORY: George [name] 80 y.o. male returns to the clinic today for follow-up visit.  The patient is feeling fine today with no concerning complaints except for mild shortness of breath with exertion.  He denied having any chest pain, cough or hemoptysis.  He lost around 2 pounds since his last visit.  He denied having any nausea, vomiting, diarrhea or constipation.  He has no headache or visual changes.  He is currently on maintenance treatment with single agent Keytruda.  The patient is here today for evaluation before starting cycle #6.   MEDICAL HISTORY: Past Medical History:  Diagnosis Date  . Cancer [facility]) Prostate  . History of radiation therapy 04/30/2020-06/14/2020   IMRT  to right lung     Dr Gery Pray  . Hypertension   . Lung cancer ([place])     ALLERGIES:  is allergic to paclitaxel and aspirin.  MEDICATIONS:  Current Outpatient Medications  Medication Sig Dispense Refill  . acetaminophen (TYLENOL) 500 MG tablet Take 500 mg by mouth every 6 (six) hours as needed for moderate pain.    Marland Kitchen albuterol (VENTOLIN HFA) 108 (90 Base) MCG/ACT inhaler INHALE 2 PUFFS BY MOUTH FOUR TIMES A DAY AS NEEDED (Patient not taking: Reported on 04/10/2021)    . ascorbic acid (VITAMIN C) 500 MG tablet TAKE ONE TABLET BY MOUTH MONDAY, WEDNESDAY AND FRIDAY  WITH FERROUS GLUCONATE    . benzonatate (TESSALON) 100 MG capsule Take 1 capsule by mouth 3 (three) times daily as needed.    . Cholecalciferol 50 MCG (2000 UT) TABS Take 2 tablets by mouth daily. (Patient not taking: Reported on 04/09/2021)    . Ensure Plus (ENSURE PLUS) LIQD TAKE 1 CAN BY MOUTH THREE TIMES A DAY    . ferrous gluconate (FERGON) 324 MG tablet TAKE ONE TABLET BY MOUTH MONDAY, WEDNESDAY AND FRIDAY    . HYDROcodone-homatropine (HYCODAN) 5-1.5 MG/5ML syrup Take 5 mLs by mouth every 6 (six) hours as needed for cough. (Patient not taking: No sig reported) 120 mL 0  . levothyroxine (SYNTHROID) 75 MCG tablet Take 1 tablet (75 mcg total) by mouth daily before breakfast. 90 tablet 0  . Nutritional Supplements (ENSURE ACTIVE PO) TAKE 1 CAN BY MOUTH THREE TIMES A DAY    . potassium chloride (MICRO-K) 10 MEQ CR capsule  Take 1 capsule by mouth daily.    . prochlorperazine (COMPAZINE) 10 MG tablet Take 1 tablet (10 mg total) by mouth every 6 (six) hours as needed. (Patient not taking: Reported on 04/10/2021) 30 tablet 2  . sucralfate (CARAFATE) 1 g tablet Take 1 tablet (1 g total) by mouth 4 (four) times daily -  with meals and at bedtime. Crush and dissolve in 15 mL of warm water prior to swallowing (Patient not taking: Reported on 04/10/2021) 60 tablet 1  . vitamin B-12 (CYANOCOBALAMIN) 500 MCG tablet Take 2 tablets by mouth daily.      No current facility-administered medications for this visit.    SURGICAL HISTORY: No past surgical history on file.  REVIEW OF SYSTEMS:  A comprehensive review of systems was negative except for: Constitutional: positive for fatigue Respiratory: positive for dyspnea on exertion   PHYSICAL EXAMINATION: General appearance: alert, cooperative, fatigued and no distress Head: Normocephalic, without obvious abnormality, atraumatic Neck: no adenopathy, no JVD, supple, symmetrical, trachea midline and thyroid not enlarged, symmetric, no tenderness/mass/nodules Lymph nodes: Cervical, supraclavicular, and axillary nodes normal. Resp: clear to auscultation bilaterally Back: symmetric, no curvature. ROM normal. No CVA tenderness. Cardio: regular rate and rhythm, S1, S2 normal, no murmur, click, rub or gallop GI: soft, non-tender; bowel sounds normal; no masses,  no organomegaly Extremities: extremities normal, atraumatic, no cyanosis or edema  ECOG PERFORMANCE STATUS: 1 - Symptomatic but completely ambulatory  Blood pressure (!) 145/86, pulse (!) 106, temperature (!) 93 F (33.9 C), temperature source Tympanic, resp. rate 20, height 5\' 6"  (1.676 m), weight 93 lb 4.8 oz (42.3 kg), SpO2 91 %.  LABORATORY DATA: Lab Results  Component Value Date   WBC 4.5 04/24/2021   HGB 12.4 (L) 04/24/2021   HCT 39.7 04/24/2021   MCV 97.1 04/24/2021   PLT 173 04/24/2021      Chemistry      Component Value Date/Time   NA 144 04/24/2021 1132   K 3.4 (L) 04/24/2021 1132   CL 101 04/24/2021 1132   CO2 30 04/24/2021 1132   BUN 11 04/24/2021 1132   CREATININE 0.96 04/24/2021 1132      Component Value Date/Time   CALCIUM 8.9 04/24/2021 1132   ALKPHOS 69 04/24/2021 1132   AST 13 (L) 04/24/2021 1132   ALT <6 04/24/2021 1132   BILITOT 0.6 04/24/2021 1132       RADIOGRAPHIC STUDIES: No results found.  ASSESSMENT AND PLAN: This is a very pleasant 80 years old African-American male recently diagnosed  with stage IIIC non-small cell lung cancer, squamous cell carcinoma presented with right upper lobe lung mass in addition to ipsilateral and contralateral hilar lymphadenopathy diagnosed in April 2021. Status post a course of concurrent chemoradiation with weekly carboplatin and paclitaxel for 6 cycles with partial response. The patient is currently undergoing consolidation treatment with immunotherapy with Imfinzi 1500 mg IV every 4 weeks status post 3 cycles.   The patient has been tolerating this treatment well with no concerning adverse effects. He had repeat CT scan of the chest performed recently.  I personally and independently reviewed the scan images and discussed the results with the patient today. Unfortunately his scan showed interval progression of the enhancing tumor within the anterior basal right upper lobe was concern for progressive chest wall involvement.  He continues to have stable borderline enlarged right hilar and right paratracheal lymph nodes. He started systemic chemotherapy with a regimen of carboplatin and Keytruda 3 weeks ago.  He was supposed  to receive paclitaxel but because of the hypersensitivity reaction he had in the past this was held.   He underwent treatment with carboplatin, Abraxane and Keytruda status post 3 more cycles and has been tolerating this treatment well. Starting from cycle #5 the patient will be on maintenance treatment with single agent Keytruda every 3 weeks. The patient tolerated the last cycle of his maintenance treatment with single agent Keytruda fairly well. I recommended for him to proceed with cycle #6 today as planned. I will see him back for follow-up visit in 3 weeks for evaluation with repeat CT scan of the chest, abdomen pelvis for restaging of his disease. For the dry cough, I gave him prescription for Hycodan every 6 hours as needed. The patient was advised to call immediately if he has any other concerning symptoms in the  interval. The patient voices understanding of current disease status and treatment options and is in agreement with the current care plan. All questions were answered. The patient knows to call the clinic with any problems, questions or concerns. We can certainly see the patient much sooner if necessary.  Disclaimer: This note was dictated with voice recognition software. Similar sounding words can inadvertently be transcribed and may not be corrected upon review.

## 2021-05-01 NOTE — ED Provider Notes (Signed)
[facility] DEPARTMENT Provider Note  CSN: [id number] Arrival date & time: 05/01/21 2006    History Chief Complaint  Patient presents with  . Shortness of Breath    HPI  George [name] is a 80 y.o. male with history of lung cancer, given chemo and radiation last year now on Keytruda. He had a visit to the cancer center earlier today for Ketruda infusion and was feeling well at the time other than his baseline SOB and dry cough. After he got home, he had increasing SOB to the point where EMS was called. He was reported hypoxic with SpO2 90% on RA, given neb x 2 with improvement. Currently he is feeling better, wants to eat because he missed dinner. Denies any CP, fever, hemoptysis, N/V/D or dysuria. Does not have a documented history of COPD, has inhalers he can use at home.    Past Medical History:  Diagnosis Date  . Cancer [facility]) Prostate  . History of radiation therapy 04/30/2020-06/14/2020   IMRT to right lung     Dr Gery Pray  . Hypertension   . Lung cancer ([place])     No past surgical history on file.  Family History  Problem Relation Age of Onset  . Breast cancer Sister   . Cancer Paternal Uncle     Social History   Tobacco Use  . Smoking status: Current Every Day Smoker    Packs/day: 0.50    Years: 60.00    Pack years: 30.00    Types: Cigarettes    Last attempt to quit: 03/21/2020    Years since quitting: 1.1  . Smokeless tobacco: Never Used  Vaping Use  . Vaping Use: Never used  Substance Use Topics  . Alcohol use: Not Currently  . Drug use: No     Home Medications Prior to Admission medications   Medication Sig Start Date End Date Taking? Authorizing Provider  acetaminophen (TYLENOL) 500 MG tablet Take 500 mg by mouth every 6 (six) hours as needed for moderate pain.    [provider]  albuterol (VENTOLIN HFA) 108 (90 Base) MCG/ACT inhaler INHALE 2 PUFFS BY MOUTH FOUR TIMES A DAY AS NEEDED Patient not taking: Reported on 04/10/2021  04/16/20   [provider]  ascorbic acid (VITAMIN C) 500 MG tablet TAKE ONE TABLET BY MOUTH MONDAY, WEDNESDAY AND FRIDAY  WITH FERROUS GLUCONATE 04/16/20   [provider]  benzonatate (TESSALON) 100 MG capsule Take 1 capsule by mouth 3 (three) times daily as needed. 03/13/20   [provider]  Cholecalciferol 50 MCG (2000 UT) TABS Take 2 tablets by mouth daily. Patient not taking: Reported on 04/09/2021 07/02/20   [provider]  Ensure Plus (ENSURE PLUS) LIQD TAKE 1 CAN BY MOUTH THREE TIMES A DAY 07/29/20   [provider]  ferrous gluconate (FERGON) 324 MG tablet TAKE ONE TABLET BY MOUTH MONDAY, [facility] AND FRIDAY 03/27/20   [provider]  HYDROcodone-homatropine (HYCODAN) 5-1.5 MG/5ML syrup Take 5 mLs by mouth every 6 (six) hours as needed for cough. Patient not taking: No sig reported 03/20/21   Curt Bears, MD  levothyroxine (SYNTHROID) 75 MCG tablet Take 1 tablet (75 mcg total) by mouth daily before breakfast. 04/16/21   Heilingoetter, Cassandra L, PA-C  Nutritional Supplements (ENSURE ACTIVE PO) TAKE 1 CAN BY MOUTH THREE TIMES A DAY 07/29/20   [provider]  potassium chloride (MICRO-K) 10 MEQ CR capsule Take 1 capsule by mouth daily. 03/15/20   [provider]  potassium chloride SA (KLOR-CON) 20 MEQ tablet Take 1 tablet (20 mEq total) by mouth daily. 05/01/21   Heilingoetter, Cassandra L, PA-C  prochlorperazine (COMPAZINE) 10 MG tablet Take 1 tablet (10 mg total) by mouth every 6 (six) hours as needed. Patient not taking: Reported on 04/10/2021 04/08/20   Heilingoetter, Cassandra L, PA-C  sucralfate (CARAFATE) 1 g tablet Take 1 tablet (1 g total) by mouth 4 (four) times daily -  with meals and at bedtime. Crush and dissolve in 15 mL of warm water prior to swallowing Patient not taking: Reported on 04/10/2021 06/11/20   Gery Pray, MD  vitamin B-12 (CYANOCOBALAMIN) 500 MCG tablet Take 2 tablets by mouth daily. 07/02/20    [provider]     Allergies    Paclitaxel and Aspirin   Review of Systems   Review of Systems A comprehensive review of systems was completed and negative except as noted in HPI.    Physical Exam BP 95/68   Pulse (!) 106   Temp (!) 97.5 F (36.4 C) (Oral)   Resp (!) 21   Ht 5\' 6"  (1.676 m)   Wt 42.2 kg   SpO2 100%   BMI 15.01 kg/m   Physical Exam Vitals and nursing note reviewed.  Constitutional:      Appearance: Normal appearance.     Comments: cachectic  HENT:     Head: Normocephalic and atraumatic.     Nose: Nose normal.     Mouth/Throat:     Mouth: Mucous membranes are moist.  Eyes:     Extraocular Movements: Extraocular movements intact.     Conjunctiva/sclera: Conjunctivae normal.  Cardiovascular:     Rate and Rhythm: Normal rate.  Pulmonary:     Effort: Pulmonary effort is normal.     Breath sounds: Wheezing (faint end expiratory) and rhonchi present.  Abdominal:     General: Abdomen is flat.     Palpations: Abdomen is soft.     Tenderness: There is no abdominal tenderness.  Musculoskeletal:        General: No swelling. Normal range of motion.     Cervical back: Neck supple.  Skin:    General: Skin is warm and dry.  Neurological:     General: No focal deficit present.     Mental Status: He is alert.  Psychiatric:        Mood and Affect: Mood normal.      ED Results / Procedures / Treatments   Labs (all labs ordered are listed, but only abnormal results are displayed) Labs Reviewed  COMPREHENSIVE METABOLIC PANEL - Abnormal; Notable for the following components:      Result Value   Glucose, Bld 186 (*)    Albumin 3.3 (*)    All other components within normal limits  CBC WITH DIFFERENTIAL/PLATELET - Abnormal; Notable for the following components:   RBC 3.72 (*)    Hemoglobin 11.5 (*)    HCT 37.6 (*)    MCV 101.1 (*)    RDW 15.8 (*)    Platelets 138 (*)    Lymphs Abs 0.6 (*)    All other components within normal limits   D-DIMER, QUANTITATIVE - Abnormal; Notable for the following components:   D-Dimer, Quant 6.39 (*)    All other components within normal limits  TROPONIN I (HIGH SENSITIVITY)  TROPONIN I (HIGH SENSITIVITY)    EKG EKG Interpretation  Date/Time:  Thursday May 01 2021 20:24:12 EDT Ventricular Rate:  120 PR  Interval:  149 QRS Duration: 69 QT Interval:  281 QTC Calculation: 397 R Axis:   -79 Text Interpretation: Sinus tachycardia Multiple ventricular premature complexes Left atrial enlargement Left anterior fascicular block Anteroseptal infarct, age indeterminate No significant change since last tracing Confirmed by Calvert Cantor [id number]) on 05/01/2021 8:59:30 PM   Radiology DG Chest 2 View  Result Date: 05/01/2021 CLINICAL DATA:  Dyspnea EXAM: CHEST - 2 VIEW COMPARISON:  05/13/2020 FINDINGS: The lungs are hyperinflated in keeping with changes of underlying COPD. Right upper lobe anteromedial pulmonary mass is again identified, better visualized on prior CT examination of 03/18/2021. This appears stable since prior CT examination. No new focal pulmonary infiltrate. No pneumothorax. Tiny right pleural effusion is unchanged. Cardiac size within normal limits. Remote T9 compression fracture again noted. IMPRESSION: Stable anteromedial right upper lobe pulmonary mass, better assessed on prior CT examination of 03/18/2021. Unchanged small associated right pleural effusion. COPD Electronically Signed   By: Fidela Salisbury MD   On: 05/01/2021 20:50    Procedures Procedures  Medications Ordered in the ED Medications - No data to display   MDM Rules/Calculators/A&P MDM CXR is stable. Patient feeling better with SpO2 100% on room air. He is tachycardic, although he just had albuterol prior to arrival. Low suspicion for PE, will check labs to compare to this morning's results including dimer. Reassess for improvement in HR with time.  ED Course  I have reviewed the triage vital signs and the  nursing notes.  Pertinent labs & imaging results that were available during my care of the patient were reviewed by me and considered in my medical decision making (see chart for details).  Clinical Course as of 05/01/21 2328  Thu May 01, 2021  2232 CBC unremarkable. Dimer is markedly elevated, will send for CTA.  [CS]  5701 HR improved, patient resting comfortably now, sleeping in bed.  [CS]  2323 Care of the patient signed out at the change of shift pending CTA.  [CS]    Clinical Course User Index [CS] Truddie Hidden, MD    Final Clinical Impression(s) / ED Diagnoses Final diagnoses:  None    Rx / DC Orders ED Discharge Orders    None       Truddie Hidden, MD 05/01/21 2328

## 2021-05-01 NOTE — Patient Instructions (Signed)
[facility] MEDICAL [facility]COLOGY  Discharge Instructions: ?Thank you for choosing [facility] to provide your oncology and hematology care.  ? ?If you have a lab appointment with the Cancer Center, please go directly to the Cancer Center and check in at the registration area. ?  ?Wear comfortable clothing and clothing appropriate for easy access to any Portacath or PICC line.  ? ?We strive to give you quality time with your provider. You may need to reschedule your appointment if you arrive late (15 or more minutes).  Arriving late affects you and other patients whose appointments are after yours.  Also, if you miss three or more appointments without notifying the office, you may be dismissed from the clinic at the provider?s discretion.    ?  ?For prescription refill requests, have your pharmacy contact our office and allow 72 hours for refills to be completed.   ? ?Today you received the following chemotherapy and/or immunotherapy agents: Keytruda ?  ?To help prevent nausea and vomiting after your treatment, we encourage you to take your nausea medication as directed. ? ?BELOW ARE SYMPTOMS THAT SHOULD BE REPORTED IMMEDIATELY: ?*FEVER GREATER THAN 100.4 F (38 ?C) OR HIGHER ?*CHILLS OR SWEATING ?*NAUSEA AND VOMITING THAT IS NOT CONTROLLED WITH YOUR NAUSEA MEDICATION ?*UNUSUAL SHORTNESS OF BREATH ?*UNUSUAL BRUISING OR BLEEDING ?*URINARY PROBLEMS (pain or burning when urinating, or frequent urination) ?*BOWEL PROBLEMS (unusual diarrhea, constipation, pain near the anus) ?TENDERNESS IN MOUTH AND THROAT WITH OR WITHOUT PRESENCE OF ULCERS (sore throat, sores in mouth, or a toothache) ?UNUSUAL RASH, SWELLING OR PAIN  ?UNUSUAL VAGINAL DISCHARGE OR ITCHING  ? ?Items with * indicate a potential emergency and should be followed up as soon as possible or go to the Emergency Department if any problems should occur. ? ?Please show the CHEMOTHERAPY ALERT CARD or IMMUNOTHERAPY ALERT CARD at check-in to the  Emergency Department and triage nurse. ? ?Should you have questions after your visit or need to cancel or reschedule your appointment, please contact [facility]  Dept: [phone]  and follow the prompts.  Office hours are 8:00 a.m. to 4:30 p.m. Monday - Friday. Please note that voicemails left after 4:00 p.m. may not be returned until the following business day.  We are closed weekends and major holidays. You have access to a nurse at all times for urgent questions. Please call the main number to the clinic Dept: [phone] and follow the prompts. ? ? ?For any non-urgent questions, you may also contact your provider using MyChart. We now offer e-Visits for anyone 18 and older to request care online for non-urgent symptoms. For details visit [email or web address]. ?  ?Also download the MyChart app! Go to the app store, search "MyChart", open the app, select [facility], and log in with your MyChart username and password. ? ?Due to Covid, a mask is required upon entering the hospital/clinic. If you do not have a mask, one will be given to you upon arrival. For doctor visits, patients may have 1 support person aged 18 or older with them. For treatment visits, patients cannot have anyone with them due to current Covid guidelines and our immunocompromised population.  ? ?

## 2021-05-02 ENCOUNTER — Emergency Department (HOSPITAL_COMMUNITY): Payer: No Typology Code available for payment source

## 2021-05-02 ENCOUNTER — Encounter (HOSPITAL_COMMUNITY): Payer: Self-pay

## 2021-05-02 DIAGNOSIS — I10 Essential (primary) hypertension: Secondary | ICD-10-CM | POA: Diagnosis present

## 2021-05-02 DIAGNOSIS — R739 Hyperglycemia, unspecified: Secondary | ICD-10-CM | POA: Diagnosis present

## 2021-05-02 DIAGNOSIS — E43 Unspecified severe protein-calorie malnutrition: Secondary | ICD-10-CM | POA: Diagnosis present

## 2021-05-02 DIAGNOSIS — Z79899 Other long term (current) drug therapy: Secondary | ICD-10-CM | POA: Diagnosis not present

## 2021-05-02 DIAGNOSIS — Z681 Body mass index (BMI) 19 or less, adult: Secondary | ICD-10-CM | POA: Diagnosis not present

## 2021-05-02 DIAGNOSIS — C3491 Malignant neoplasm of unspecified part of right bronchus or lung: Secondary | ICD-10-CM | POA: Diagnosis present

## 2021-05-02 DIAGNOSIS — Z20822 Contact with and (suspected) exposure to covid-19: Secondary | ICD-10-CM | POA: Diagnosis present

## 2021-05-02 DIAGNOSIS — E039 Hypothyroidism, unspecified: Secondary | ICD-10-CM | POA: Diagnosis present

## 2021-05-02 DIAGNOSIS — Z8546 Personal history of malignant neoplasm of prostate: Secondary | ICD-10-CM | POA: Diagnosis not present

## 2021-05-02 DIAGNOSIS — Z923 Personal history of irradiation: Secondary | ICD-10-CM | POA: Diagnosis not present

## 2021-05-02 DIAGNOSIS — Z79890 Hormone replacement therapy (postmenopausal): Secondary | ICD-10-CM | POA: Diagnosis not present

## 2021-05-02 DIAGNOSIS — F17210 Nicotine dependence, cigarettes, uncomplicated: Secondary | ICD-10-CM | POA: Diagnosis present

## 2021-05-02 DIAGNOSIS — Z9221 Personal history of antineoplastic chemotherapy: Secondary | ICD-10-CM | POA: Diagnosis not present

## 2021-05-02 DIAGNOSIS — J441 Chronic obstructive pulmonary disease with (acute) exacerbation: Secondary | ICD-10-CM | POA: Diagnosis present

## 2021-05-02 DIAGNOSIS — T380X5A Adverse effect of glucocorticoids and synthetic analogues, initial encounter: Secondary | ICD-10-CM | POA: Diagnosis present

## 2021-05-02 DIAGNOSIS — D649 Anemia, unspecified: Secondary | ICD-10-CM | POA: Diagnosis present

## 2021-05-02 LAB — TROPONIN I (HIGH SENSITIVITY): Troponin I (High Sensitivity): 3 ng/L (ref <18)

## 2021-05-02 LAB — CREATININE, SERUM
Creatinine, Ser: 0.85 mg/dL (ref 0.61–1.24)
GFR, Estimated: >60 mL/min (ref >60)

## 2021-05-02 LAB — CBC
HCT: 37.2 % — ABNORMAL LOW (ref 39.0–52.0)
Hemoglobin: 11.4 g/dL — ABNORMAL LOW (ref 13.0–17.0)
MCH: 30.7 pg (ref 26.0–34.0)
MCHC: 30.6 g/dL (ref 30.0–36.0)
MCV: 100.3 fL — ABNORMAL HIGH (ref 80.0–100.0)
Platelets: 143 10*3/uL — ABNORMAL LOW (ref 150–400)
RBC: 3.71 MIL/uL — ABNORMAL LOW (ref 4.22–5.81)
RDW: 15.6 % — ABNORMAL HIGH (ref 11.5–15.5)
WBC: 3.3 10*3/uL — ABNORMAL LOW (ref 4.0–10.5)
nRBC: 0 % (ref 0.0–0.2)

## 2021-05-02 LAB — RESP PANEL BY RT-PCR (FLU A&B, COVID) ARPGX2
Influenza A by PCR: NEGATIVE
Influenza B by PCR: NEGATIVE
SARS Coronavirus 2 by RT PCR: NEGATIVE

## 2021-05-02 MED ORDER — PREDNISONE 20 MG PO TABS: 40.0000 mg | ORAL_TABLET | Freq: Every day | ORAL | Status: DC

## 2021-05-02 MED ORDER — GUAIFENESIN 100 MG/5ML PO SOLN: 5.0000 mL | ORAL | Status: DC | PRN

## 2021-05-02 MED ORDER — IPRATROPIUM-ALBUTEROL 0.5-2.5 (3) MG/3ML IN SOLN: 3.0000 mL | Freq: Four times a day (QID) | RESPIRATORY_TRACT | Status: DC

## 2021-05-02 MED ORDER — IOHEXOL 350 MG/ML SOLN
75.0000 mL | Freq: Once | INTRAVENOUS | Status: AC | PRN
  Administered 2021-05-02: 75 mL via INTRAVENOUS

## 2021-05-02 MED ORDER — ALBUTEROL SULFATE (2.5 MG/3ML) 0.083% IN NEBU: 2.5000 mg | INHALATION_SOLUTION | RESPIRATORY_TRACT | Status: DC | PRN

## 2021-05-02 MED ORDER — ONDANSETRON HCL 4 MG PO TABS: 4.0000 mg | ORAL_TABLET | Freq: Four times a day (QID) | ORAL | Status: DC | PRN

## 2021-05-02 MED ORDER — ACETAMINOPHEN 650 MG RE SUPP: 650.0000 mg | Freq: Four times a day (QID) | RECTAL | Status: DC | PRN

## 2021-05-02 MED ORDER — ENOXAPARIN SODIUM 30 MG/0.3ML IJ SOSY
30.0000 mg | PREFILLED_SYRINGE | INTRAMUSCULAR | Status: DC
  Administered 2021-05-02 – 2021-05-04 (×3): 30 mg via SUBCUTANEOUS
  Filled 2021-05-02 (×3): qty 0.3

## 2021-05-02 MED ORDER — IPRATROPIUM-ALBUTEROL 0.5-2.5 (3) MG/3ML IN SOLN
3.0000 mL | Freq: Once | RESPIRATORY_TRACT | Status: AC
  Administered 2021-05-02: 3 mL via RESPIRATORY_TRACT
  Filled 2021-05-02: qty 3

## 2021-05-02 MED ORDER — ENOXAPARIN SODIUM 40 MG/0.4ML IJ SOSY: 40.0000 mg | PREFILLED_SYRINGE | INTRAMUSCULAR | Status: DC

## 2021-05-02 MED ORDER — METHYLPREDNISOLONE SODIUM SUCC 125 MG IJ SOLR
60.0000 mg | Freq: Two times a day (BID) | INTRAMUSCULAR | Status: AC
  Administered 2021-05-02 – 2021-05-03 (×2): 60 mg via INTRAVENOUS
  Filled 2021-05-02 (×2): qty 2

## 2021-05-02 MED ORDER — POTASSIUM CHLORIDE CRYS ER 20 MEQ PO TBCR
20.0000 meq | EXTENDED_RELEASE_TABLET | Freq: Every day | ORAL | Status: DC
  Administered 2021-05-02 – 2021-05-03 (×2): 20 meq via ORAL
  Filled 2021-05-02 (×2): qty 1

## 2021-05-02 MED ORDER — LEVOTHYROXINE SODIUM 50 MCG PO TABS
75.0000 ug | ORAL_TABLET | Freq: Every day | ORAL | Status: DC
  Administered 2021-05-03 – 2021-05-05 (×3): 75 ug via ORAL
  Filled 2021-05-02 (×3): qty 1

## 2021-05-02 MED ORDER — METHYLPREDNISOLONE SODIUM SUCC 125 MG IJ SOLR
80.0000 mg | Freq: Once | INTRAMUSCULAR | Status: AC
  Administered 2021-05-02: 80 mg via INTRAVENOUS
  Filled 2021-05-02: qty 2

## 2021-05-02 MED ORDER — ACETAMINOPHEN 325 MG PO TABS
650.0000 mg | ORAL_TABLET | Freq: Four times a day (QID) | ORAL | Status: DC | PRN
Start: 2021-05-02 — End: 2021-05-05

## 2021-05-02 MED ORDER — ONDANSETRON HCL 4 MG/2ML IJ SOLN: 4.0000 mg | Freq: Four times a day (QID) | INTRAMUSCULAR | Status: DC | PRN

## 2021-05-02 MED ORDER — MAGNESIUM SULFATE 2 GM/50ML IV SOLN
2.0000 g | Freq: Once | INTRAVENOUS | Status: AC
  Administered 2021-05-02: 2 g via INTRAVENOUS
  Filled 2021-05-02: qty 50

## 2021-05-02 MED ORDER — IPRATROPIUM-ALBUTEROL 0.5-2.5 (3) MG/3ML IN SOLN
3.0000 mL | Freq: Four times a day (QID) | RESPIRATORY_TRACT | Status: DC
  Administered 2021-05-02 – 2021-05-04 (×8): 3 mL via RESPIRATORY_TRACT
  Filled 2021-05-02 (×8): qty 3

## 2021-05-02 NOTE — ED Provider Notes (Signed)
Care assumed from Dr. Karle Starch.  Patient with a history of lung cancer here with increasing shortness of breath and cough.  Hypoxic 90% on room air improved after nebulizers.  CT PE is negative for pulmonary embolism or pneumonia.  Does show a lung mass with rib destruction. Troponin negative x2  Recheck, patient has diminished breath sounds with some expiratory wheezing.  He is given nebulizers and steroids.  Wheezing and tachypnea persists.  Patient hypoxic with ambulation 89%.  We given additional nebulizers and magnesium.  We will plan for hospital admission and observation. D/w Dr. Sidney Ace.    George Essex, MD 05/02/21 [phone]

## 2021-05-02 NOTE — ED Notes (Signed)
Pt at rest:  SpO2 100%, pulse 83 Pt ambulating: SpO2 100%, pulse 94

## 2021-05-02 NOTE — H&P (Signed)
History and Physical    George [name] PZW:[id number] DOB: Mar 02, 1941 DOA: 05/01/2021  PCP: [facility]  Patient coming from: Home  Chief Complaint: shortness of breath  HPI: George [name] is a 80 y.o. male with medical history significant of hypothyroidism, COPD, NSCLC. Presenting with shortness of breath. He reports that it started yesterday after he got home from his cancer treatment. He was resting when all of the sudden, he couldn't catch his breath. He had no chest pain or palpitations. He had a dry cough. He decided he would just sit still and see if it would stop. It didn't. He had no inhalers or oxygen at home. His niece became concerned and brought him to the ED. He denies any other aggravating or alleviating factors.    ED Course: CTA chest was negative for clot or PNA. It shows a small increase in his known pleural effusion. He was given O2. He was given steroids, inhalers, and magnesium. [facility] was called for admission.   Review of Systems: Review of systems is otherwise negative for all not mentioned in HPI.   PMHx Past Medical History:  Diagnosis Date  . Cancer [facility]) Prostate  . History of radiation therapy 04/30/2020-06/14/2020   IMRT to right lung     Dr Gery Pray  . Hypertension   . Lung cancer ([place])     PSHx No past surgical history on file.  SocHx  reports that he has been smoking cigarettes. He has a 30.00 pack-year smoking history. He has never used smokeless tobacco. He reports previous alcohol use. He reports that he does not use drugs.  Allergies  Allergen Reactions  . Paclitaxel Shortness Of Breath  . Aspirin     Stomach upset    FamHx Family History  Problem Relation Age of Onset  . Breast cancer Sister   . Cancer Paternal Uncle     Prior to Admission medications   Medication Sig Start Date End Date Taking? Authorizing Provider  acetaminophen (TYLENOL) 500 MG tablet Take 500 mg by mouth every 6 (six) hours as needed for moderate  pain.    [provider]  albuterol (VENTOLIN HFA) 108 (90 Base) MCG/ACT inhaler INHALE 2 PUFFS BY MOUTH FOUR TIMES A DAY AS NEEDED Patient not taking: Reported on 04/10/2021 04/16/20   [provider]  ascorbic acid (VITAMIN C) 500 MG tablet TAKE ONE TABLET BY MOUTH MONDAY, WEDNESDAY AND FRIDAY  WITH FERROUS GLUCONATE 04/16/20   [provider]  benzonatate (TESSALON) 100 MG capsule Take 1 capsule by mouth 3 (three) times daily as needed. 03/13/20   [provider]  Cholecalciferol 50 MCG (2000 UT) TABS Take 2 tablets by mouth daily. Patient not taking: Reported on 04/09/2021 07/02/20   [provider]  Ensure Plus (ENSURE PLUS) LIQD TAKE 1 CAN BY MOUTH THREE TIMES A DAY 07/29/20   [provider]  ferrous gluconate (FERGON) 324 MG tablet TAKE ONE TABLET BY MOUTH MONDAY, [facility] AND FRIDAY 03/27/20   [provider]  HYDROcodone-homatropine (HYCODAN) 5-1.5 MG/5ML syrup Take 5 mLs by mouth every 6 (six) hours as needed for cough. Patient not taking: No sig reported 03/20/21   Curt Bears, MD  levothyroxine (SYNTHROID) 75 MCG tablet Take 1 tablet (75 mcg total) by mouth daily before breakfast. 04/16/21   Heilingoetter, Cassandra L, PA-C  Nutritional Supplements (ENSURE ACTIVE PO) TAKE 1 CAN BY MOUTH THREE TIMES A DAY 07/29/20   [provider]  potassium chloride (MICRO-K) 10 MEQ  CR capsule Take 1 capsule by mouth daily. 03/15/20   [provider]  potassium chloride SA (KLOR-CON) 20 MEQ tablet Take 1 tablet (20 mEq total) by mouth daily. 05/01/21   Heilingoetter, Cassandra L, PA-C  prochlorperazine (COMPAZINE) 10 MG tablet Take 1 tablet (10 mg total) by mouth every 6 (six) hours as needed. Patient not taking: Reported on 04/10/2021 04/08/20   Heilingoetter, Cassandra L, PA-C  sucralfate (CARAFATE) 1 g tablet Take 1 tablet (1 g total) by mouth 4 (four) times daily -  with meals and at bedtime. Crush and dissolve in 15 mL of warm water  prior to swallowing Patient not taking: Reported on 04/10/2021 06/11/20   Gery Pray, MD  vitamin B-12 (CYANOCOBALAMIN) 500 MCG tablet Take 2 tablets by mouth daily. 07/02/20   [provider]    Physical Exam: Vitals:   05/02/21 0515 05/02/21 0530 05/02/21 0545 05/02/21 0615  BP: 122/76 133/79 125/61 108/74  Pulse: 78 84 85 88  Resp: 11 12 15 16   Temp:      TempSrc:      SpO2: 100% 100% 100% 100%  Weight:      Height:        General: 80 y.o. male resting in bed in NAD, emaciated Eyes: PERRL, normal sclera ENMT: Nares patent w/o discharge, orophaynx clear, dentition normal, ears w/o discharge/lesions/ulcers Neck: Supple, trachea midline Cardiovascular: RRR, +S1, S2, no m/g/r, equal pulses throughout Respiratory: diffuse exp wheeze, normal WOB on ventura mask GI: BS+, NDNT, no masses noted, no organomegaly noted MSK: No e/c/c Skin: No rashes, bruises, ulcerations noted Neuro: A&O x 3, no focal deficits Psyc: Appropriate interaction and affect, calm/cooperative  Labs on Admission: I have personally reviewed following labs and imaging studies  CBC: Recent Labs  Lab 05/01/21 0924 05/01/21 2142  WBC 3.1* 4.3  NEUTROABS 1.7 3.4  HGB 11.3* 11.5*  HCT 35.2* 37.6*  MCV 97.5 101.1*  PLT 144* 932*   Basic Metabolic Panel: Recent Labs  Lab 05/01/21 0924 05/01/21 2142  NA 144 143  K 3.0* 4.0  CL 103 101  CO2 31 32  GLUCOSE 141* 186*  BUN 9 11  CREATININE 1.07 1.07  CALCIUM 8.9 9.0   GFR: Estimated Creatinine Clearance: 34 mL/min (by C-G formula based on SCr of 1.07 mg/dL). Liver Function Tests: Recent Labs  Lab 05/01/21 0924 05/01/21 2142  AST 12* 19  ALT <6 8  ALKPHOS 60 57  BILITOT 0.5 0.7  PROT 6.9 7.6  ALBUMIN 2.9* 3.3*   No results for input(s): LIPASE, AMYLASE in the last 168 hours. No results for input(s): AMMONIA in the last 168 hours. Coagulation Profile: No results for input(s): INR, PROTIME in the last 168 hours. Cardiac Enzymes: No  results for input(s): CKTOTAL, CKMB, CKMBINDEX, TROPONINI in the last 168 hours. BNP (last 3 results) No results for input(s): PROBNP in the last 8760 hours. HbA1C: No results for input(s): HGBA1C in the last 72 hours. CBG: No results for input(s): GLUCAP in the last 168 hours. Lipid Profile: No results for input(s): CHOL, HDL, LDLCALC, TRIG, CHOLHDL, LDLDIRECT in the last 72 hours. Thyroid Function Tests: Recent Labs    05/01/21 0923  TSH 56.901*   Anemia Panel: No results for input(s): VITAMINB12, FOLATE, FERRITIN, TIBC, IRON, RETICCTPCT in the last 72 hours. Urine analysis:    Component Value Date/Time   COLORURINE YELLOW 08/02/2016 [place] 08/02/2016 0250   LABSPEC 1.011 08/02/2016 0250   PHURINE 6.0 08/02/2016 0250  GLUCOSEU NEGATIVE 08/02/2016 0250   HGBUR NEGATIVE 08/02/2016 0250   BILIRUBINUR NEGATIVE 08/02/2016 0250   KETONESUR NEGATIVE 08/02/2016 0250   PROTEINUR NEGATIVE 08/02/2016 0250   UROBILINOGEN 1.0 04/16/2012 0746   NITRITE NEGATIVE 08/02/2016 0250   LEUKOCYTESUR NEGATIVE 08/02/2016 0250    Radiological Exams on Admission: DG Chest 2 View  Result Date: 05/01/2021 CLINICAL DATA:  Dyspnea EXAM: CHEST - 2 VIEW COMPARISON:  05/13/2020 FINDINGS: The lungs are hyperinflated in keeping with changes of underlying COPD. Right upper lobe anteromedial pulmonary mass is again identified, better visualized on prior CT examination of 03/18/2021. This appears stable since prior CT examination. No new focal pulmonary infiltrate. No pneumothorax. Tiny right pleural effusion is unchanged. Cardiac size within normal limits. Remote T9 compression fracture again noted. IMPRESSION: Stable anteromedial right upper lobe pulmonary mass, better assessed on prior CT examination of 03/18/2021. Unchanged small associated right pleural effusion. COPD Electronically Signed   By: Fidela Salisbury MD   On: 05/01/2021 20:50   CT Angio Chest PE W and/or Wo Contrast  Result  Date: 05/02/2021 CLINICAL DATA:  Received chemo treatment today, after which he experienced SOB, called EMS, satted 90% on RA. EXAM: CT ANGIOGRAPHY CHEST WITH CONTRAST TECHNIQUE: Multidetector CT imaging of the chest was performed using the standard protocol during bolus administration of intravenous contrast. Multiplanar CT image reconstructions and MIPs were obtained to evaluate the vascular anatomy. CONTRAST:  39mL OMNIPAQUE IOHEXOL 350 MG/ML SOLN COMPARISON:  None. FINDINGS: Cardiovascular: Satisfactory opacification of the pulmonary arteries to the segmental level. No evidence of pulmonary embolism. Normal heart size. No significant pericardial effusion. The thoracic aorta is normal in caliber. Severe calcified and noncalcified. Atherosclerotic plaque of the thoracic aorta. Four-vessel coronary artery calcifications. Mediastinum/Nodes: Right hilar and mediastinal lymphadenopathy difficult to measure on this study due to timing of contrast (9:133). No axillary lymph nodes. Thyroid gland, trachea, and esophagus demonstrate no significant findings. Lungs/Pleura: A 7 S changes. Redemonstration of a spiculated, poorly defined, approximately 5 x 4 x 5 cm, right upper lobe mass with associated anterior chest wall extension with limited evaluation on this study due to timing of contrast. Narrowing and obstruction of the distal associated bronchial (10:64). Left lower lobe pulmonary micronodule (6:98). Interval increase in trace right pleural effusion. No left pleural effusion. No pneumothorax. Upper Abdomen: No acute abnormality with limited evaluation due to timing of contrast. Musculoskeletal: Chronic T9 compression fracture with greater than 85% height loss. Chronic T3 compression fracture with greater than 15% height loss. Nondisplaced chronic sternal fracture. Interval increase in cortical erosion and destruction of the right anterior third rib along the mass lesion (9:122). Review of the MIP images confirms the  above findings. IMPRESSION: 1. No pulmonary embolus. 2. Spiculated, poorly defined, approximately 5 x 4 x 5 cm, right upper lobe mass with associated anterior chest wall extension with limited evaluation on this study due to timing of contrast. Narrowing and obstruction of the distal associated bronchial. 3. Interval increase in cortical erosion/destruction of the right anterior third rib along the lung mass. 4. Interval increase of trace volume right pleural effusion. 5. Chronic T3 and T9 compression fractures. 6. Aortic Atherosclerosis (ICD10-I70.0) and Emphysema (ICD10-J43.9). Electronically Signed   By: Iven Finn M.D.   On: 05/02/2021 00:56    EKG: Independently reviewed. Sinus tach, no st elevations  Assessment/Plan COPD exacerbation     - admit to inpt, tele     - continue steroids, inhalers, guaifenesin     - wean O2 as  able     - CTA chest w/o clot, PNA; minor increase in previous pleural effusion; follow  Hypothyroidism     - continue home levothyroxine  Hyperglycemia     - check A1c; previous Hx of DM  S3c non-small cell lung cancer     - currently on chemo     - following w/ Dr. Julien Nordmann (he is aware of pt presence in hospital)     - ok to treat w/ steroids for COPD exacerbation  Severe protein-calorie malnutrition     - dietary consult  DVT prophylaxis: lovenox  Code Status: FULL  Family Communication: w/ niece by phone  Consults called: Oncology [facility]) notified of patient admission by secure chat  Status is: Inpatient  Remains inpatient appropriate because:Inpatient level of care appropriate due to severity of illness   Dispo: The patient is from: Home              Anticipated d/c is to: Home              Patient currently is not medically stable to d/c.   Difficult to place patient No  Time spent coordinating admission: 70 minutes  [facility]  If 7PM-7AM, please contact night-coverage [email or web address]  05/02/2021, 8:01 AM

## 2021-05-02 NOTE — ED Notes (Signed)
At rest, pt's SpO2 was 98 and pulse was 86. When ambulating, SpO2 fell to 89 and pulse rose to 115

## 2021-05-03 DIAGNOSIS — J441 Chronic obstructive pulmonary disease with (acute) exacerbation: Secondary | ICD-10-CM | POA: Diagnosis not present

## 2021-05-03 LAB — COMPREHENSIVE METABOLIC PANEL
ALT: 8 U/L (ref 0–44)
AST: 16 U/L (ref 15–41)
Albumin: 3 g/dL — ABNORMAL LOW (ref 3.5–5.0)
Alkaline Phosphatase: 51 U/L (ref 38–126)
Anion gap: 6 (ref 5–15)
BUN: 13 mg/dL (ref 8–23)
CO2: 33 mmol/L — ABNORMAL HIGH (ref 22–32)
Calcium: 8.9 mg/dL (ref 8.9–10.3)
Chloride: 99 mmol/L (ref 98–111)
Creatinine, Ser: 0.91 mg/dL (ref 0.61–1.24)
GFR, Estimated: >60 mL/min (ref >60)
Glucose, Bld: 289 mg/dL — ABNORMAL HIGH (ref 70–99)
Potassium: 4.4 mmol/L (ref 3.5–5.1)
Sodium: 138 mmol/L (ref 135–145)
Total Bilirubin: 0.2 mg/dL — ABNORMAL LOW (ref 0.3–1.2)
Total Protein: 6.8 g/dL (ref 6.5–8.1)

## 2021-05-03 LAB — GLUCOSE, CAPILLARY
Glucose-Capillary: 429 mg/dL — ABNORMAL HIGH (ref 70–99)
Glucose-Capillary: 463 mg/dL — ABNORMAL HIGH (ref 70–99)
Glucose-Capillary: 342 mg/dL — ABNORMAL HIGH (ref 70–99)
Glucose-Capillary: 213 mg/dL — ABNORMAL HIGH (ref 70–99)

## 2021-05-03 LAB — CBC
HCT: 34.3 % — ABNORMAL LOW (ref 39.0–52.0)
Hemoglobin: 10.3 g/dL — ABNORMAL LOW (ref 13.0–17.0)
MCH: 29.9 pg (ref 26.0–34.0)
MCHC: 30 g/dL (ref 30.0–36.0)
MCV: 99.4 fL (ref 80.0–100.0)
Platelets: 121 10*3/uL — ABNORMAL LOW (ref 150–400)
RBC: 3.45 MIL/uL — ABNORMAL LOW (ref 4.22–5.81)
RDW: 15.4 % (ref 11.5–15.5)
WBC: 3.5 10*3/uL — ABNORMAL LOW (ref 4.0–10.5)
nRBC: 0 % (ref 0.0–0.2)

## 2021-05-03 MED ORDER — INSULIN ASPART 100 UNIT/ML IJ SOLN
3.0000 [IU] | Freq: Three times a day (TID) | INTRAMUSCULAR | Status: DC
  Administered 2021-05-03 – 2021-05-05 (×6): 3 [IU] via SUBCUTANEOUS

## 2021-05-03 MED ORDER — METHYLPREDNISOLONE SODIUM SUCC 40 MG IJ SOLR
40.0000 mg | Freq: Two times a day (BID) | INTRAMUSCULAR | Status: DC
  Administered 2021-05-03: 40 mg via INTRAVENOUS
  Filled 2021-05-03: qty 1

## 2021-05-03 MED ORDER — INSULIN ASPART 100 UNIT/ML IJ SOLN
0.0000 [IU] | Freq: Three times a day (TID) | INTRAMUSCULAR | Status: DC
  Administered 2021-05-03: 9 [IU] via SUBCUTANEOUS
  Administered 2021-05-03: 7 [IU] via SUBCUTANEOUS
  Administered 2021-05-04: 5 [IU] via SUBCUTANEOUS
  Administered 2021-05-04: 3 [IU] via SUBCUTANEOUS

## 2021-05-03 MED ORDER — INSULIN GLARGINE 100 UNIT/ML ~~LOC~~ SOLN
15.0000 [IU] | Freq: Every day | SUBCUTANEOUS | Status: DC
  Administered 2021-05-03 – 2021-05-05 (×3): 15 [IU] via SUBCUTANEOUS
  Filled 2021-05-03 (×3): qty 0.15

## 2021-05-03 NOTE — Plan of Care (Signed)
Pt able to comfortably sleeping with head of bed at a 90 degree angle; no s/s of acute distress or pain reported or observed; call light within reach with bed at lowest position for safety.

## 2021-05-03 NOTE — Progress Notes (Addendum)
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 -He reports going home after his chemo treatment on 5/25, after reaching home he started experiencing dyspnea which is rather acute in onset, with mild cough and wheeze, denies any fevers or chills. -He presented to the ED, was noted to be mildly hypoxic with activity, CTA chest was negative for pulmonary embolismapproximately 5 x 4 x 5 cm, right upper lobe mass with associated anterior chest wall extension with limited evaluation on this study due to timing of contrast.Narrowing and obstruction of the distal associated bronchial. 3. Interval increase in cortical erosion/destruction of the rightanterior third rib along the lung mass.   Assessment & Plan:   COPD exacerbation -CTA negative for PE, notable for lower right upper lobe mass with anterior chest wall extension -Very poor air movement, continue IV steroids today, duo nebs -Wean off oxygen -Ambulate today  Stage III non-small cell lung cancer -Currently on chemotherapy, followed by Dr. Earlie Server -CT as noted above  Severe protein calorie malnutrition -Add supplements  Hypothyroidism -Continue Synthroid  Chronic anemia -Stable  Hyperglycemia -Likely secondary to steroids, follow-up hemoglobin A1c, add NovoLog with meals  DVT prophylaxis: Lovenox Code Status: Full code Family Communication: Discussed with patient in detail, no family at bedside Disposition Plan:  Status is: Inpatient  Remains inpatient appropriate because:Inpatient level of care appropriate due to severity of illness   Dispo: The patient is from: Home              Anticipated d/c is to: Home              Patient currently  is not medically stable to d/c.   Difficult to place patient No        Consultants:   Oncology notified   Procedures:   Antimicrobials:    Subjective: -Feeling better, feels like his breathing starting to improve  Objective: Vitals:   05/02/21 2349 05/03/21 0104 05/03/21 0350 05/03/21 0829  BP: 122/77  (!) 131/92   Pulse: 75  78   Resp: 14  16   Temp: (!) 97.4 F (36.3 C)  (!) 97.5 F (36.4 C)   TempSrc: Oral  Oral   SpO2: 100% 98% 95% 97%  Weight:      Height:        Intake/Output Summary (Last 24 hours) at 05/03/2021 1141 Last data filed at 05/03/2021 0932 Gross per 24 hour  Intake 240 ml  Output 225 ml  Net 15 ml   Filed Weights   05/01/21 2016  Weight: 42.2 kg    Examination:  General exam: Chronically ill frail cachectic male sitting up in bed, awake alert oriented to self and place, cognitive deficits noted CVS: S1-S2, regular rate rhythm Lungs: Poor air movement bilaterally, scattered expiratory wheezes Abdomen: Soft, nontender, bowel sounds present Extremities: No edema Skin: No rashes on exposed skin Psych: Flat affect  Data Reviewed:   CBC: Recent Labs  Lab 05/01/21 0924 05/01/21 2142 05/02/21 1634 05/03/21 0447  WBC 3.1* 4.3 3.3* 3.5*  NEUTROABS 1.7 3.4  --   --   HGB 11.3* 11.5* 11.4* 10.3*  HCT 35.2* 37.6* 37.2* 34.3*  MCV 97.5 101.1* 100.3* 99.4  PLT 144* 138* 143* 671*   Basic Metabolic Panel: Recent  Labs  Lab 05/01/21 0924 05/01/21 2142 05/02/21 1634 05/03/21 0447  NA 144 143  --  138  K 3.0* 4.0  --  4.4  CL 103 101  --  99  CO2 31 32  --  33*  GLUCOSE 141* 186*  --  289*  BUN 9 11  --  13  CREATININE 1.07 1.07 0.85 0.91  CALCIUM 8.9 9.0  --  8.9   GFR: Estimated Creatinine Clearance: 39.9 mL/min (by C-G formula based on SCr of 0.91 mg/dL). Liver Function Tests: Recent Labs  Lab 05/01/21 0924 05/01/21 2142 05/03/21 0447  AST 12* 19 16  ALT 6 8 8   ALKPHOS 60 57 51  BILITOT 0.5 0.7 0.2*  PROT 6.9 7.6  6.8  ALBUMIN 2.9* 3.3* 3.0*   No results for input(s): LIPASE, AMYLASE in the last 168 hours. No results for input(s): AMMONIA in the last 168 hours. Coagulation Profile: No results for input(s): INR, PROTIME in the last 168 hours. Cardiac Enzymes: No results for input(s): CKTOTAL, CKMB, CKMBINDEX, TROPONINI in the last 168 hours. BNP (last 3 results) No results for input(s): PROBNP in the last 8760 hours. HbA1C: No results for input(s): HGBA1C in the last 72 hours. CBG: No results for input(s): GLUCAP in the last 168 hours. Lipid Profile: No results for input(s): CHOL, HDL, LDLCALC, TRIG, CHOLHDL, LDLDIRECT in the last 72 hours. Thyroid Function Tests: Recent Labs    05/01/21 0923  TSH 56.901*   Anemia Panel: No results for input(s): VITAMINB12, FOLATE, FERRITIN, TIBC, IRON, RETICCTPCT in the last 72 hours. Urine analysis:    Component Value Date/Time   COLORURINE YELLOW 08/02/2016 0250   APPEARANCEUR CLEAR 08/02/2016 0250   LABSPEC 1.011 08/02/2016 0250   PHURINE 6.0 08/02/2016 0250   GLUCOSEU NEGATIVE 08/02/2016 0250   HGBUR NEGATIVE 08/02/2016 0250   BILIRUBINUR NEGATIVE 08/02/2016 0250   KETONESUR NEGATIVE 08/02/2016 0250   PROTEINUR NEGATIVE 08/02/2016 0250   UROBILINOGEN 1.0 04/16/2012 0746   NITRITE NEGATIVE 08/02/2016 0250   LEUKOCYTESUR NEGATIVE 08/02/2016 0250   Sepsis Labs: @LABRCNTIP (procalcitonin:4,lacticidven:4)  ) Recent Results (from the past 240 hour(s))  Resp Panel by RT-PCR (Flu A&B, Covid) Nasopharyngeal Swab     Status: None   Collection Time: 05/02/21  6:26 AM   Specimen: Nasopharyngeal Swab; Nasopharyngeal(NP) swabs in vial transport medium  Result Value Ref Range Status   SARS Coronavirus 2 by RT PCR NEGATIVE NEGATIVE Final    Comment: (NOTE) SARS-CoV-2 target nucleic acids are NOT DETECTED.  The SARS-CoV-2 RNA is generally detectable in upper respiratory specimens during the acute phase of infection. The lowest concentration of  SARS-CoV-2 viral copies this assay can detect is 138 copies/mL. A negative result does not preclude SARS-Cov-2 infection and should not be used as the sole basis for treatment or other patient management decisions. A negative result may occur with  improper specimen collection/handling, submission of specimen other than nasopharyngeal swab, presence of viral mutation(s) within the areas targeted by this assay, and inadequate number of viral copies(<138 copies/mL). A negative result must be combined with clinical observations, patient history, and epidemiological information. The expected result is Negative.  Fact Sheet for Patients:  EntrepreneurPulse.com.au  Fact Sheet for Healthcare Providers:  IncredibleEmployment.be  This test is no t yet approved or cleared by the [place] FDA and  has been authorized for detection and/or diagnosis of SARS-CoV-2 by FDA under an Emergency Use Authorization (EUA). This EUA will remain  in effect (meaning this test can be used)  for the duration of the COVID-19 declaration under Section 564(b)(1) of the Act, 21 U.S.C.section 360bbb-3(b)(1), unless the authorization is terminated  or revoked sooner.       Influenza A by PCR NEGATIVE NEGATIVE Final   Influenza B by PCR NEGATIVE NEGATIVE Final    Comment: (NOTE) The Xpert Xpress SARS-CoV-2/FLU/RSV plus assay is intended as an aid in the diagnosis of influenza from Nasopharyngeal swab specimens and should not be used as a sole basis for treatment. Nasal washings and aspirates are unacceptable for Xpert Xpress SARS-CoV-2/FLU/RSV testing.  Fact Sheet for Patients: EntrepreneurPulse.com.au  Fact Sheet for Healthcare Providers: IncredibleEmployment.be  This test is not yet approved or cleared by the [place] FDA and has been authorized for detection and/or diagnosis of SARS-CoV-2 by FDA under an Emergency Use  Authorization (EUA). This EUA will remain in effect (meaning this test can be used) for the duration of the COVID-19 declaration under Section 564(b)(1) of the Act, 21 U.S.C. section 360bbb-3(b)(1), unless the authorization is terminated or revoked.  Performed at [facility], [place] [address] [place], [place] [address]      Radiology Studies: DG Chest 2 View  Result Date: 05/01/2021 CLINICAL DATA:  Dyspnea EXAM: CHEST - 2 VIEW COMPARISON:  05/13/2020 FINDINGS: The lungs are hyperinflated in keeping with changes of underlying COPD. Right upper lobe anteromedial pulmonary mass is again identified, better visualized on prior CT examination of 03/18/2021. This appears stable since prior CT examination. No new focal pulmonary infiltrate. No pneumothorax. Tiny right pleural effusion is unchanged. Cardiac size within normal limits. Remote T9 compression fracture again noted. IMPRESSION: Stable anteromedial right upper lobe pulmonary mass, better assessed on prior CT examination of 03/18/2021. Unchanged small associated right pleural effusion. COPD Electronically Signed   By: Fidela Salisbury MD   On: 05/01/2021 20:50   CT Angio Chest PE W and/or Wo Contrast  Result Date: 05/02/2021 CLINICAL DATA:  Received chemo treatment today, after which he experienced SOB, called EMS, satted 90% on RA. EXAM: CT ANGIOGRAPHY CHEST WITH CONTRAST TECHNIQUE: Multidetector CT imaging of the chest was performed using the standard protocol during bolus administration of intravenous contrast. Multiplanar CT image reconstructions and MIPs were obtained to evaluate the vascular anatomy. CONTRAST:  57mL OMNIPAQUE IOHEXOL 350 MG/ML SOLN COMPARISON:  None. FINDINGS: Cardiovascular: Satisfactory opacification of the pulmonary arteries to the segmental level. No evidence of pulmonary embolism. Normal heart size. No significant pericardial effusion. The thoracic aorta is normal in caliber. Severe calcified and  noncalcified. Atherosclerotic plaque of the thoracic aorta. Four-vessel coronary artery calcifications. Mediastinum/Nodes: Right hilar and mediastinal lymphadenopathy difficult to measure on this study due to timing of contrast (9:133). No axillary lymph nodes. Thyroid gland, trachea, and esophagus demonstrate no significant findings. Lungs/Pleura: A 7 S changes. Redemonstration of a spiculated, poorly defined, approximately 5 x 4 x 5 cm, right upper lobe mass with associated anterior chest wall extension with limited evaluation on this study due to timing of contrast. Narrowing and obstruction of the distal associated bronchial (10:64). Left lower lobe pulmonary micronodule (6:98). Interval increase in trace right pleural effusion. No left pleural effusion. No pneumothorax. Upper Abdomen: No acute abnormality with limited evaluation due to timing of contrast. Musculoskeletal: Chronic T9 compression fracture with greater than 85% height loss. Chronic T3 compression fracture with greater than 15% height loss. Nondisplaced chronic sternal fracture. Interval increase in cortical erosion and destruction of the right anterior third rib along the mass lesion (9:122). Review of the MIP images  confirms the above findings. IMPRESSION: 1. No pulmonary embolus. 2. Spiculated, poorly defined, approximately 5 x 4 x 5 cm, right upper lobe mass with associated anterior chest wall extension with limited evaluation on this study due to timing of contrast. Narrowing and obstruction of the distal associated bronchial. 3. Interval increase in cortical erosion/destruction of the right anterior third rib along the lung mass. 4. Interval increase of trace volume right pleural effusion. 5. Chronic T3 and T9 compression fractures. 6. Aortic Atherosclerosis (ICD10-I70.0) and Emphysema (ICD10-J43.9). Electronically Signed   By: Iven Finn M.D.   On: 05/02/2021 00:56        Scheduled Meds: . enoxaparin (LOVENOX) injection  30 mg  Subcutaneous Q24H  . ipratropium-albuterol  3 mL Nebulization Q6H  . levothyroxine  75 mcg Oral QAC breakfast  . potassium chloride SA  20 mEq Oral Daily   Continuous Infusions:   LOS: 1 day    Time spent: 63min  Domenic Polite, MD [facility]   05/03/2021, 11:41 AM

## 2021-05-04 DIAGNOSIS — J441 Chronic obstructive pulmonary disease with (acute) exacerbation: Secondary | ICD-10-CM | POA: Diagnosis not present

## 2021-05-04 LAB — BASIC METABOLIC PANEL
Anion gap: 7 (ref 5–15)
BUN: 17 mg/dL (ref 8–23)
CO2: 29 mmol/L (ref 22–32)
Calcium: 9 mg/dL (ref 8.9–10.3)
Chloride: 100 mmol/L (ref 98–111)
Creatinine, Ser: 1.08 mg/dL (ref 0.61–1.24)
GFR, Estimated: >60 mL/min (ref >60)
Glucose, Bld: 294 mg/dL — ABNORMAL HIGH (ref 70–99)
Potassium: 4.5 mmol/L (ref 3.5–5.1)
Sodium: 136 mmol/L (ref 135–145)

## 2021-05-04 LAB — GLUCOSE, CAPILLARY
Glucose-Capillary: 270 mg/dL — ABNORMAL HIGH (ref 70–99)
Glucose-Capillary: 209 mg/dL — ABNORMAL HIGH (ref 70–99)
Glucose-Capillary: 107 mg/dL — ABNORMAL HIGH (ref 70–99)
Glucose-Capillary: 124 mg/dL — ABNORMAL HIGH (ref 70–99)

## 2021-05-04 MED ORDER — IPRATROPIUM-ALBUTEROL 0.5-2.5 (3) MG/3ML IN SOLN
3.0000 mL | Freq: Two times a day (BID) | RESPIRATORY_TRACT | Status: DC
  Administered 2021-05-04 – 2021-05-05 (×2): 3 mL via RESPIRATORY_TRACT
  Filled 2021-05-04 (×2): qty 3

## 2021-05-04 MED ORDER — PREDNISONE 20 MG PO TABS
20.0000 mg | ORAL_TABLET | Freq: Every day | ORAL | Status: DC
  Administered 2021-05-04 – 2021-05-05 (×2): 20 mg via ORAL
  Filled 2021-05-04 (×2): qty 1

## 2021-05-04 MED ORDER — GLUCERNA SHAKE PO LIQD
237.0000 mL | Freq: Three times a day (TID) | ORAL | Status: DC
  Administered 2021-05-04: 237 mL via ORAL
  Filled 2021-05-04 (×2): qty 237

## 2021-05-04 MED ORDER — ENSURE ENLIVE PO LIQD
237.0000 mL | Freq: Three times a day (TID) | ORAL | Status: DC
  Administered 2021-05-04 – 2021-05-05 (×5): 237 mL via ORAL

## 2021-05-04 MED ORDER — ADULT MULTIVITAMIN W/MINERALS CH
1.0000 | ORAL_TABLET | Freq: Every day | ORAL | Status: DC
  Administered 2021-05-04 – 2021-05-05 (×2): 1 via ORAL
  Filled 2021-05-04 (×2): qty 1

## 2021-05-04 NOTE — Evaluation (Signed)
Physical Therapy Evaluation Patient Details Name: George [name] MRN: [id number] DOB: 19-Apr-1941 Today's Date: 05/04/2021   History of Present Illness  Pt admitted with SOB and dx with COPD exacerbation.  Pt with hx of COPD and lung CA - currently on chemo  Clinical Impression  Pt admitted as above and presenting with functional mobility limitations 2* limited endurance, generalized weakness, and ambulatory balance deficits.  This date, pt up to ambulate in halls with RW and O2 at 3L and sats maintained at 95% + but with pt reporting SOB and with marked increase in work of breathing.  Pt hopes to dc home with continued intermittent assist of family.    Follow Up Recommendations Home health PT    Equipment Recommendations  None recommended by PT    Recommendations for Other Services       Precautions / Restrictions Precautions Precautions: Fall Restrictions Weight Bearing Restrictions: No      Mobility  Bed Mobility Overal bed mobility: Modified Independent             General bed mobility comments: Pt to EOB sitting unassisted    Transfers Overall transfer level: Needs assistance Equipment used: Rolling walker (2 wheeled) Transfers: Sit to/from Stand Sit to Stand: Min guard         General transfer comment: steady assist with cues for use of UEs  Ambulation/Gait Ambulation/Gait assistance: Min guard Gait Distance (Feet): 78 Feet Assistive device: Rolling walker (2 wheeled) Gait Pattern/deviations: Step-through pattern;Decreased step length - right;Decreased step length - left;Shuffle;Trunk flexed Gait velocity: cues to slow for safety and energy conservation   General Gait Details: cues for posture, position from RW, and to decrease pace.  Stairs            Wheelchair Mobility    Modified Rankin (Stroke Patients Only)       Balance Overall balance assessment: Needs assistance Sitting-balance support: Feet supported;No upper extremity  supported Sitting balance-Leahy Scale: Good     Standing balance support: No upper extremity supported Standing balance-Leahy Scale: Fair                               Pertinent Vitals/Pain Pain Assessment: No/denies pain    Home Living Family/patient expects to be discharged to:: Private residence Living Arrangements: Alone Available Help at Discharge: Family;Available PRN/intermittently (Pt has nieces who assist) Type of Home: House Home Access: Stairs to enter   [employer] of Steps: 1 Home Layout: One level Home Equipment: Walker - 2 wheels;Cane - single point      Prior Function Level of Independence: Independent               Hand Dominance        Extremity/Trunk Assessment   Upper Extremity Assessment Upper Extremity Assessment: Generalized weakness    Lower Extremity Assessment Lower Extremity Assessment: Generalized weakness    Cervical / Trunk Assessment Cervical / Trunk Assessment: Normal  Communication   Communication: No difficulties  Cognition Arousal/Alertness: Awake/alert Behavior During Therapy: WFL for tasks assessed/performed Overall Cognitive Status: Within Functional Limits for tasks assessed                                        General Comments      Exercises     Assessment/Plan    PT Assessment Patient needs continued  PT services  PT Problem List Decreased strength;Decreased activity tolerance;Decreased balance;Decreased mobility;Decreased knowledge of use of DME       PT Treatment Interventions DME instruction;Gait training;Stair training;Patient/family education;Therapeutic activities;Therapeutic exercise;Balance training    PT Goals (Current goals can be found in the Care Plan section)  Acute Rehab PT Goals Patient Stated Goal: Regain IND PT Goal Formulation: With patient Time For Goal Achievement: 05/18/21 Potential to Achieve Goals: Fair    Frequency Min 3X/week    Barriers to discharge Decreased caregiver support States nieces assist as needed    Co-evaluation               AM-PAC PT "6 Clicks" Mobility  Outcome Measure Help needed turning from your back to your side while in a flat bed without using bedrails?: None Help needed moving from lying on your back to sitting on the side of a flat bed without using bedrails?: None Help needed moving to and from a bed to a chair (including a wheelchair)?: A Little Help needed standing up from a chair using your arms (e.g., wheelchair or bedside chair)?: A Little Help needed to walk in hospital room?: A Little Help needed climbing 3-5 steps with a railing? : A Little 6 Click Score: 20    End of Session Equipment Utilized During Treatment: Gait belt;Oxygen Activity Tolerance: Patient limited by fatigue Patient left: in chair;with call bell/phone within reach;with chair alarm set Nurse Communication: Mobility status PT Visit Diagnosis: Muscle weakness (generalized) (M62.81);Difficulty in walking, not elsewhere classified (R26.2)    Time: 1004-1030 PT Time Calculation (min) (ACUTE ONLY): 26 min   Charges:   PT Evaluation $PT Eval Low Complexity: 1 Low PT Treatments $Gait Training: 8-22 mins        Debe Coder PT Acute Rehabilitation Services Pager [phone] Office [phone]   Zaven Klemens 05/04/2021, 1:26 PM

## 2021-05-04 NOTE — Progress Notes (Signed)
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  GOAL:   Patient will meet greater than or equal to 90% of their needs  MONITOR:   PO intake,Supplement acceptance,Weight trends,Labs,I & O's  REASON FOR ASSESSMENT:   Consult Assessment of nutrition requirement/status  ASSESSMENT:   Patient with PMH significant for COPD and NSCLC on chemotherapy. Presents this admission with COPD exacerbation.   Intake this admission has been stable. Last two meal completions charted as 50-100%. Of note patient takes three Ensure Plus daily at home. Will continue Ensure this admission.   Recommend liberalization of diet to REGULAR and d/c Glucerna. Suspect steroids are playing a large role in hyperglycemia. Ensure provides more protein/kcal than Glucerna and food options should not be limited given severity of malnutrition. Insulin should be adjusted accordingly.   Patient noted to weigh 96 lb at his last outpatient oncology dietitian visit on 3/3. Records indicate patient weighed 98 lb on 4/14 and 93 lb this admission (5.1% wt loss in one month, significant for time frame.   Suspect patient is severely malnourished but will need to obtain NFPE to make diagnosis.   Medications: SS novolog, lantus, prednisone Labs: CBG [phone]  Diet Order:   Diet Order            Diet Carb Modified Fluid consistency: Thin; Room service appropriate? Yes  Diet effective now                 EDUCATION NEEDS:   Not appropriate for education at this time  Skin:  Skin Assessment: Reviewed RN Assessment  Last BM:  PTA  Height:   Ht Readings from Last 1 Encounters:  05/01/21 5\' 6"  (1.676 m)    Weight:   Wt Readings  from Last 1 Encounters:  05/01/21 42.2 kg    BMI:  Body mass index is 15.01 kg/m.  Estimated Nutritional Needs:   Kcal:  1500-1700 kcal  Protein:  75-95 grams  Fluid:  >/= 1.5 L/day  Mariana Single RD, LDN Clinical Nutrition Pager listed in [place]

## 2021-05-04 NOTE — Progress Notes (Signed)
PROGRESS NOTE    George [name]  BLT:[id number] DOB: Jul 11, 1941 DOA: 05/01/2021 PCP: [facility]  Brief Narrative: 80 year old male with history of stage IIIc non-small cell lung cancer previously treated with chemo and XRT, currently on chemotherapy, COPD,, hypothyroidism presented to the ED with shortness of breath. -He reports going home after his chemo treatment on 5/25, after reaching home he started experiencing dyspnea which is rather acute in onset, with mild cough and wheeze, denies any fevers or chills. -He presented to the ED, was noted to be mildly hypoxic with activity, CTA chest was negative for pulmonary embolismapproximately 5 x 4 x 5 cm, right upper lobe mass with associated anterior chest wall extension with limited evaluation on this study due to timing of contrast.Narrowing and obstruction of the distal associated bronchial. 3. Interval increase in cortical erosion/destruction of the rightanterior third rib along the lung mass. -Improving with treatment for COPD exacerbation, complicated by severe hyperglycemia   Assessment & Plan:   COPD exacerbation -CTA negative for PE, notable for lower right upper lobe mass with anterior chest wall extension -Improving, stop IV Solu-Medrol switch to prednisone taper -Continue duo nebs, wean off O2 -Ambulate, PT eval -Discharge planning  Severe hyperglycemia -CBGs went up to 400s yesterday evening, patient denies any history of diabetes,, exacerbated by IV steroids, will taper down rapidly -Patient denies any history of diabetes -Continue Lantus, NovoLog with meals -Follow-up hemoglobin A1c  Stage III non-small cell lung cancer -Currently on chemotherapy, followed by Dr. Earlie Server -CT as noted above  Severe protein calorie malnutrition -Add supplements  Hypothyroidism -Continue Synthroid  Chronic anemia -Stable  DVT prophylaxis: Lovenox Code Status: Full code Family Communication: Discussed with patient in  detail, no family at bedside Disposition Plan:  Status is: Inpatient  Remains inpatient appropriate because:Inpatient level of care appropriate due to severity of illness   Dispo: The patient is from: Home              Anticipated d/c is to: Home              Patient currently is not medically stable to d/c.   Difficult to place patient No  Consultants:   Oncology notified   Procedures:   Antimicrobials:    Subjective: -Feeling better, feels like his breathing starting to improve  Objective: Vitals:   05/03/21 2012 05/04/21 0145 05/04/21 0450 05/04/21 0927  BP: 111/74  91/60   Pulse: 93  84   Resp: 18  16   Temp: 97.9 F (36.6 C)  (!) 97.4 F (36.3 C)   TempSrc: Oral  Oral   SpO2: 100% 99% 100% 96%  Weight:      Height:        Intake/Output Summary (Last 24 hours) at 05/04/2021 1259 Last data filed at 05/04/2021 0076 Gross per 24 hour  Intake 290 ml  Output 250 ml  Net 40 ml   Filed Weights   05/01/21 2016  Weight: 42.2 kg    Examination:  General exam: Chronically ill frail gentleman sitting up in bed, awake alert oriented to self and place, mild memory deficits noted CVS: S1-S2, regular rate rhythm Lungs: Poor air movement bilaterally, no expiratory wheezes today Abdomen: Soft, nontender, bowel sounds present Extremities: No edema  Skin: No rashes on exposed skin Psych: Flat affect  Data Reviewed:   CBC: Recent Labs  Lab 05/01/21 0924 05/01/21 2142 05/02/21 1634 05/03/21 0447  WBC 3.1* 4.3 3.3* 3.5*  NEUTROABS 1.7 3.4  --   --  HGB 11.3* 11.5* 11.4* 10.3*  HCT 35.2* 37.6* 37.2* 34.3*  MCV 97.5 101.1* 100.3* 99.4  PLT 144* 138* 143* 621*   Basic Metabolic Panel: Recent Labs  Lab 05/01/21 0924 05/01/21 2142 05/02/21 1634 05/03/21 0447 05/04/21 0852  NA 144 143  --  138 136  K 3.0* 4.0  --  4.4 4.5  CL 103 101  --  99 100  CO2 31 32  --  33* 29  GLUCOSE 141* 186*  --  289* 294*  BUN 9 11  --  13 17  CREATININE 1.07 1.07 0.85  0.91 1.08  CALCIUM 8.9 9.0  --  8.9 9.0   GFR: Estimated Creatinine Clearance: 33.6 mL/min (by C-G formula based on SCr of 1.08 mg/dL). Liver Function Tests: Recent Labs  Lab 05/01/21 0924 05/01/21 2142 05/03/21 0447  AST 12* 19 16  ALT 6 8 8   ALKPHOS 60 57 51  BILITOT 0.5 0.7 0.2*  PROT 6.9 7.6 6.8  ALBUMIN 2.9* 3.3* 3.0*   No results for input(s): LIPASE, AMYLASE in the last 168 hours. No results for input(s): AMMONIA in the last 168 hours. Coagulation Profile: No results for input(s): INR, PROTIME in the last 168 hours. Cardiac Enzymes: No results for input(s): CKTOTAL, CKMB, CKMBINDEX, TROPONINI in the last 168 hours. BNP (last 3 results) No results for input(s): PROBNP in the last 8760 hours. HbA1C: No results for input(s): HGBA1C in the last 72 hours. CBG: Recent Labs  Lab 05/03/21 1248 05/03/21 1659 05/03/21 2204 05/04/21 0800 05/04/21 1210  GLUCAP 429* 342* 213* 270* 209*   Lipid Profile: No results for input(s): CHOL, HDL, LDLCALC, TRIG, CHOLHDL, LDLDIRECT in the last 72 hours. Thyroid Function Tests: No results for input(s): TSH, T4TOTAL, FREET4, T3FREE, THYROIDAB in the last 72 hours. Anemia Panel: No results for input(s): VITAMINB12, FOLATE, FERRITIN, TIBC, IRON, RETICCTPCT in the last 72 hours. Urine analysis:    Component Value Date/Time   COLORURINE YELLOW 08/02/2016 0250   APPEARANCEUR CLEAR 08/02/2016 0250   LABSPEC 1.011 08/02/2016 0250   PHURINE 6.0 08/02/2016 0250   GLUCOSEU NEGATIVE 08/02/2016 0250   HGBUR NEGATIVE 08/02/2016 0250   BILIRUBINUR NEGATIVE 08/02/2016 0250   KETONESUR NEGATIVE 08/02/2016 0250   PROTEINUR NEGATIVE 08/02/2016 0250   UROBILINOGEN 1.0 04/16/2012 0746   NITRITE NEGATIVE 08/02/2016 0250   LEUKOCYTESUR NEGATIVE 08/02/2016 0250   Sepsis Labs: @LABRCNTIP (procalcitonin:4,lacticidven:4)  ) Recent Results (from the past 240 hour(s))  Resp Panel by RT-PCR (Flu A&B, Covid) Nasopharyngeal Swab     Status: None    Collection Time: 05/02/21  6:26 AM   Specimen: Nasopharyngeal Swab; Nasopharyngeal(NP) swabs in vial transport medium  Result Value Ref Range Status   SARS Coronavirus 2 by RT PCR NEGATIVE NEGATIVE Final    Comment: (NOTE) SARS-CoV-2 target nucleic acids are NOT DETECTED.  The SARS-CoV-2 RNA is generally detectable in upper respiratory specimens during the acute phase of infection. The lowest concentration of SARS-CoV-2 viral copies this assay can detect is 138 copies/mL. A negative result does not preclude SARS-Cov-2 infection and should not be used as the sole basis for treatment or other patient management decisions. A negative result may occur with  improper specimen collection/handling, submission of specimen other than nasopharyngeal swab, presence of viral mutation(s) within the areas targeted by this assay, and inadequate number of viral copies(<138 copies/mL). A negative result must be combined with clinical observations, patient history, and epidemiological information. The expected result is Negative.  Fact Sheet for Patients:  EntrepreneurPulse.com.au  Fact Sheet for Healthcare Providers:  IncredibleEmployment.be  This test is no t yet approved or cleared by the [place] FDA and  has been authorized for detection and/or diagnosis of SARS-CoV-2 by FDA under an Emergency Use Authorization (EUA). This EUA will remain  in effect (meaning this test can be used) for the duration of the COVID-19 declaration under Section 564(b)(1) of the Act, 21 U.S.C.section 360bbb-3(b)(1), unless the authorization is terminated  or revoked sooner.       Influenza A by PCR NEGATIVE NEGATIVE Final   Influenza B by PCR NEGATIVE NEGATIVE Final    Comment: (NOTE) The Xpert Xpress SARS-CoV-2/FLU/RSV plus assay is intended as an aid in the diagnosis of influenza from Nasopharyngeal swab specimens and should not be used as a sole basis for treatment.  Nasal washings and aspirates are unacceptable for Xpert Xpress SARS-CoV-2/FLU/RSV testing.  Fact Sheet for Patients: EntrepreneurPulse.com.au  Fact Sheet for Healthcare Providers: IncredibleEmployment.be  This test is not yet approved or cleared by the [place] FDA and has been authorized for detection and/or diagnosis of SARS-CoV-2 by FDA under an Emergency Use Authorization (EUA). This EUA will remain in effect (meaning this test can be used) for the duration of the COVID-19 declaration under Section 564(b)(1) of the Act, 21 U.S.C. section 360bbb-3(b)(1), unless the authorization is terminated or revoked.  Performed at [facility], [place] [address], [place], [place] [address]     Scheduled Meds: . enoxaparin (LOVENOX) injection  30 mg Subcutaneous Q24H  . feeding supplement  237 mL Oral TID BM  . insulin aspart  0-9 Units Subcutaneous TID WC  . insulin aspart  3 Units Subcutaneous TID WC  . insulin glargine  15 Units Subcutaneous Daily  . ipratropium-albuterol  3 mL Nebulization Q6H  . levothyroxine  75 mcg Oral QAC breakfast  . multivitamin with minerals  1 tablet Oral Daily  . predniSONE  20 mg Oral Q breakfast   Continuous Infusions:   LOS: 2 days    Time spent: 6min  Domenic Polite, MD [facility]   05/04/2021, 12:59 PM

## 2021-05-05 DIAGNOSIS — J441 Chronic obstructive pulmonary disease with (acute) exacerbation: Secondary | ICD-10-CM | POA: Diagnosis not present

## 2021-05-05 LAB — CBC
HCT: 39.1 % (ref 39.0–52.0)
Hemoglobin: 12 g/dL — ABNORMAL LOW (ref 13.0–17.0)
MCH: 30.5 pg (ref 26.0–34.0)
MCHC: 30.7 g/dL (ref 30.0–36.0)
MCV: 99.2 fL (ref 80.0–100.0)
Platelets: 142 10*3/uL — ABNORMAL LOW (ref 150–400)
RBC: 3.94 MIL/uL — ABNORMAL LOW (ref 4.22–5.81)
RDW: 15.5 % (ref 11.5–15.5)
WBC: 5.7 10*3/uL (ref 4.0–10.5)
nRBC: 0 % (ref 0.0–0.2)

## 2021-05-05 LAB — BASIC METABOLIC PANEL
Anion gap: 7 (ref 5–15)
BUN: 18 mg/dL (ref 8–23)
CO2: 32 mmol/L (ref 22–32)
Calcium: 9.8 mg/dL (ref 8.9–10.3)
Chloride: 99 mmol/L (ref 98–111)
Creatinine, Ser: 0.87 mg/dL (ref 0.61–1.24)
GFR, Estimated: >60 mL/min (ref >60)
Glucose, Bld: 101 mg/dL — ABNORMAL HIGH (ref 70–99)
Potassium: 4.9 mmol/L (ref 3.5–5.1)
Sodium: 138 mmol/L (ref 135–145)

## 2021-05-05 LAB — GLUCOSE, CAPILLARY
Glucose-Capillary: 111 mg/dL — ABNORMAL HIGH (ref 70–99)
Glucose-Capillary: 104 mg/dL — ABNORMAL HIGH (ref 70–99)
Glucose-Capillary: 79 mg/dL (ref 70–99)

## 2021-05-05 MED ORDER — PREDNISONE 10 MG PO TABS: 10.0000 mg | ORAL_TABLET | Freq: Every day | ORAL | 0 refills | Status: AC

## 2021-05-05 MED ORDER — ACETAMINOPHEN 325 MG PO TABS: 650.0000 mg | ORAL_TABLET | ORAL | 2 refills | Status: AC | PRN

## 2021-05-05 MED ORDER — ALBUTEROL SULFATE HFA 108 (90 BASE) MCG/ACT IN AERS: 1.0000 | INHALATION_SPRAY | Freq: Four times a day (QID) | RESPIRATORY_TRACT | 1 refills | Status: AC | PRN

## 2021-05-05 MED ORDER — INSULIN GLARGINE 100 UNIT/ML ~~LOC~~ SOLN: 8.0000 [IU] | Freq: Every day | SUBCUTANEOUS | Status: DC

## 2021-05-05 NOTE — Progress Notes (Signed)
SATURATION QUALIFICATIONS: (This note is used to comply with regulatory documentation for home oxygen)  Patient Saturations on Room Air at Rest = 97%  Patient Saturations on Room Air while Ambulating = 83%  Patient Saturations on 3 Liters of oxygen while Ambulating = 95%  Please briefly explain why patient needs home oxygen: shortness of breath observed while ambulating, oxygen saturation 83%

## 2021-05-05 NOTE — TOC Initial Note (Signed)
Transition of Care [facility]) - Initial/Assessment Note    Patient Details  Name: George [name] MRN: [id number] Date of Birth: 04-23-1941  [facility] CM/SW Contact:    Lynnell Catalan, RN Phone Number: 05/05/2021, 11:29 AM  Clinical Narrative:                 Pt weaned off 02. Choice offered for home health services. [facility] chosen. Referral called into liaison.  Expected Discharge Plan: [place] Barriers to Discharge: No Barriers Identified   Patient Goals and CMS Choice Patient states their goals for this hospitalization and ongoing recovery are:: To get home CMS Medicare.gov Compare Post Acute Care list provided to:: Patient Choice offered to / list presented to : Patient  Expected Discharge Plan and Services Expected Discharge Plan: [place]   Discharge Planning Services: CM Consult Post Acute Care Choice: [place] arrangements for the past 2 months: Single Family Home                           HH Arranged: PT,Nurse's Aide [place]: [place] Date [place]: 05/05/21 Time HH Agency Contacted: [id number] Representative spoke with at [place]: Tommi Rumps  Prior Living Arrangements/Services Living arrangements for the past 2 months: [place] with:: Self Patient language and need for interpreter reviewed:: Yes Do you feel safe going back to the place where you live?: Yes      Need for Family Participation in Patient Care: Yes (Comment) Care giver support system in place?: Yes (comment)   Criminal Activity/Legal Involvement Pertinent to Current Situation/Hospitalization: No - Comment as needed  Activities of Daily Living Home Assistive Devices/Equipment: None ADL Screening (condition at time of admission) Patient's cognitive ability adequate to safely complete daily activities?: Yes Is the patient deaf or have difficulty hearing?: No Does the patient have difficulty seeing,  even when wearing glasses/contacts?: Yes Does the patient have difficulty concentrating, remembering, or making decisions?: No Patient able to express need for assistance with ADLs?: Yes Does the patient have difficulty dressing or bathing?: No Independently performs ADLs?: No Communication: Independent Dressing (OT): Independent Grooming: Independent Feeding: Independent Bathing: Independent Toileting: Needs assistance Is this a change from baseline?: Pre-admission baseline In/Out Bed: Needs assistance Is this a change from baseline?: Pre-admission baseline Walks in Home: Needs assistance Is this a change from baseline?: Pre-admission baseline Does the patient have difficulty walking or climbing stairs?: Yes Weakness of Legs: Both Weakness of Arms/Hands: Both  Permission Sought/Granted   Permission granted to share information with : Yes, Verbal Permission Granted     Permission granted to share info w AGENCY: [facility]        Emotional Assessment Appearance:: Appears stated age Attitude/Demeanor/Rapport: Gracious Affect (typically observed): Calm Orientation: : Oriented to Self,Oriented to Place,Oriented to  Time,Oriented to Situation Alcohol / Substance Use: Not Applicable Psych Involvement: No (comment)  Admission diagnosis:  COPD exacerbation ([place]) [J44.1] Patient Active Problem List   Diagnosis Date Noted  . Thrombocytopenia ([place]) 09/16/2020  . Encounter for antineoplastic immunotherapy 08/15/2020  . Weight loss 05/13/2020  . Goals of care, counseling/discussion 04/08/2020  . Encounter for antineoplastic chemotherapy 04/08/2020  . 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 Chest tightness 08/02/2016  . COPD exacerbation ([place]) 08/02/2016  . Pulmonary nodule 08/02/2016  . Dehydration 08/02/2016  . Chest pain 08/02/2016  . Thrombocytopenia, acquired (  [place]) 04/17/2012  . Rib fractures, left, closed, initial encounter 04/17/2012  . Alcohol abuse  04/16/2012  . Alcohol intoxication ([place]) 04/16/2012  . Aspiration pneumonia ([place]) 04/16/2012  . Tobacco abuse 04/16/2012  . History of prostate cancer 04/16/2012  . Poor dentition 04/16/2012   PCP:  Clinic, [place]:   [id number] [address], [place] [address] Phone: [phone] Fax: [phone]     Social Determinants of Health (SDOH) Interventions    Readmission Risk Interventions No flowsheet data found.

## 2021-05-05 NOTE — Discharge Instructions (Signed)
Chronic Obstructive Pulmonary Disease  Chronic obstructive pulmonary disease (COPD) is a long-term (chronic) condition that affects the lungs. COPD is a general term that can be used to describe many different lung problems that cause lung swelling (inflammation) and limit airflow, including chronic bronchitis and emphysema. If you have COPD, your lung function will probably never return to normal. In most cases, it gets worse over time. However, there are steps you can take to slow the progression of the disease and improve your quality of life. What are the causes? This condition may be caused by:  Smoking. This is the most common cause.  Certain genes passed down through families. What increases the risk? The following factors may make you more likely to develop this condition:  Secondhand smoke from cigarettes, pipes, or cigars.  Exposure to chemicals and other irritants such as fumes and dust in the work environment.  Chronic lung conditions or infections. What are the signs or symptoms? Symptoms of this condition include:  Shortness of breath, especially during physical activity.  Chronic cough with a large amount of thick mucus. Sometimes the cough may not have any mucus (dry cough).  Wheezing.  Rapid breaths.  Gray or bluish discoloration (cyanosis) of the skin, especially in your fingers, toes, or lips.  Feeling tired (fatigue).  Weight loss.  Chest tightness.  Frequent infections.  Episodes when breathing symptoms become much worse (exacerbations).  Swelling in the ankles, feet, or legs. This may occur in later stages of the disease. How is this diagnosed? This condition is diagnosed based on:  Your medical history.  A physical exam. You may also have tests, including:  Lung (pulmonary) function tests. This may include a spirometry test, which measures your ability to exhale properly.  Chest X-ray.  CT scan.  Blood tests. How is this treated? This  condition may be treated with:  Medicines. These may include inhaled rescue medicines to treat acute exacerbations as well as long-term, or maintenance, medicines to prevent flare-ups of COPD. ? Bronchodilators help treat COPD by dilating the airways to allow increased airflow and make your breathing more comfortable. ? Steroids can reduce airway inflammation and help prevent exacerbations.  Smoking cessation. If you smoke, your health care provider may ask you to quit, and may also recommend therapy or replacement products to help you quit.  Pulmonary rehabilitation. This may involve working with a team of health care providers and specialists, such as respiratory, occupational, and physical therapists.  Exercise and physical activity. These are beneficial for nearly all people with COPD.  Nutrition therapy to gain weight, if you are underweight.  Oxygen. Supplemental oxygen therapy is only helpful if you have a low oxygen level in your blood (hypoxemia).  Lung surgery or transplant.  Palliative care. This is to help people with COPD feel comfortable when treatment is no longer working. Follow these instructions at home: Medicines  Take over-the-counter and prescription medicines (inhaled or pills) only as told by your health care provider.  Talk to your health care provider before taking any cough or allergy medicines. You may need to avoid certain medicines that dry out your airways. Lifestyle  If you are a smoker, the most important thing that you can do is to stop smoking. Do not use any products that contain nicotine or tobacco, such as cigarettes and e-cigarettes. If you need help quitting, ask your health care provider. Continuing to smoke will cause the disease to progress faster.  Avoid exposure to things that irritate your  lungs, such as smoke, chemicals, and fumes.  Stay active, but balance activity with periods of rest. Exercise and physical activity will help you maintain  your ability to do things you want to do.  Learn and use relaxation techniques to manage stress and to control your breathing.  Get the right amount of sleep and get quality sleep. Most adults need 7 or more hours per night.  Eat healthy foods. Eating smaller, more frequent meals and resting before meals may help you maintain your strength. Controlled breathing Learn and use controlled breathing techniques as directed by your health care provider. Controlled breathing techniques include:  Pursed lip breathing. Start by breathing in (inhaling) through your nose for 1 second. Then, purse your lips as if you were going to whistle and breathe out (exhale) through the pursed lips for 2 seconds.  Diaphragmatic breathing. Start by putting one hand on your abdomen just above your waist. Inhale slowly through your nose. The hand on your abdomen should move out. Then purse your lips and exhale slowly. You should be able to feel the hand on your abdomen moving in as you exhale. Controlled coughing Learn and use controlled coughing to clear mucus from your lungs. Controlled coughing is a series of short, progressive coughs. The steps of controlled coughing are: 1. Lean your head slightly forward. 2. Breathe in deeply using diaphragmatic breathing. 3. Try to hold your breath for 3 seconds. 4. Keep your mouth slightly open while coughing twice. 5. Spit any mucus out into a tissue. 6. Rest and repeat the steps once or twice as needed. General instructions  Make sure you receive all the vaccines that your health care provider recommends, especially the pneumococcal and influenza vaccines. Preventing infection and hospitalization is very important when you have COPD.  Use oxygen therapy and pulmonary rehabilitation if directed to by your health care provider. If you require home oxygen therapy, ask your health care provider whether you should purchase a pulse oximeter to measure your oxygen level at  home.  Work with your health care provider to develop a COPD action plan. This will help you know what steps to take if your condition gets worse.  Keep other chronic health conditions under control as told by your health care provider.  Avoid extreme temperature and humidity changes.  Avoid contact with people who have an illness that spreads from person to person (is contagious), such as viral infections or pneumonia.  Keep all follow-up visits as told by your health care provider. This is important. Contact a health care provider if:  You are coughing up more mucus than usual.  There is a change in the color or thickness of your mucus.  Your breathing is more labored than usual.  Your breathing is faster than usual.  You have difficulty sleeping.  You need to use your rescue medicines or inhalers more often than expected.  You have trouble doing routine activities such as getting dressed or walking around the house. Get help right away if:  You have shortness of breath while you are resting.  You have shortness of breath that prevents you from: ? Being able to talk. ? Performing your usual physical activities.  You have chest pain lasting longer than 5 minutes.  Your skin color is more blue (cyanotic) than usual.  You measure low oxygen saturations for longer than 5 minutes with a pulse oximeter.  You have a fever.  You feel too tired to breathe normally. Summary  Chronic obstructive  pulmonary disease (COPD) is a long-term (chronic) condition that affects the lungs.  Your lung function will probably never return to normal. In most cases, it gets worse over time. However, there are steps you can take to slow the progression of the disease and improve your quality of life.  Treatment for COPD may include taking medicines, quitting smoking, pulmonary rehabilitation, and changes to diet and exercise. As the disease progresses, you may need oxygen therapy, a lung  transplant, or palliative care.  To help manage your condition, do not smoke, avoid exposure to things that irritate your lungs, stay up to date on all vaccines, and follow your health care provider's instructions for taking medicines. This information is not intended to replace advice given to you by your health care provider. Make sure you discuss any questions you have with your health care provider. Document Revised: 11/05/2017 Document Reviewed: 12/28/2016 Elsevier Patient Education  2021 [employer].   COPD and Physical Activity Chronic obstructive pulmonary disease (COPD) is a long-term (chronic) condition that affects the lungs. COPD is a general term that can be used to describe many different lung problems that cause lung swelling (inflammation) and limit airflow, including chronic bronchitis and emphysema. The main symptom of COPD is shortness of breath, which makes it harder to do even simple tasks. This can also make it harder to exercise and be active. Talk with your health care provider about treatments to help you breathe better and actions you can take to prevent breathing problems during physical activity. What are the benefits of exercising with COPD? Exercising regularly is an important part of a healthy lifestyle. You can still exercise and do physical activities even though you have COPD. Exercise and physical activity improve your shortness of breath by increasing blood flow (circulation). This causes your heart to pump more oxygen through your body. Moderate exercise can improve your:  Oxygen use.  Energy level.  Shortness of breath.  Strength in your breathing muscles.  Heart health.  Sleep.  Self-esteem and feelings of self-worth.  Depression, stress, and anxiety levels. Exercise can benefit everyone with COPD. The severity of your disease may affect how hard you can exercise, especially at first, but everyone can benefit. Talk with your health care provider  about how much exercise is safe for you, and which activities and exercises are safe for you.   What actions can I take to prevent breathing problems during physical activity?  Sign up for a pulmonary rehabilitation program. This type of program may include: ? Education about lung diseases. ? Exercise classes that teach you how to exercise and be more active while improving your breathing. This usually involves:  Exercise using your lower extremities, such as a stationary bicycle.  About 30 minutes of exercise, 2 to 5 times per week, for 6 to 12 weeks  Strength training, such as push ups or leg lifts. ? Nutrition education. ? Group classes in which you can talk with others who also have COPD and learn ways to manage stress.  If you use an oxygen tank, you should use it while you exercise. Work with your health care provider to adjust your oxygen for your physical activity. Your resting flow rate is different from your flow rate during physical activity.  While you are exercising: ? Take slow breaths. ? Pace yourself and do not try to go too fast. ? Purse your lips while breathing out. Pursing your lips is similar to a kissing or whistling position. ? If  doing exercise that uses a quick burst of effort, such as weight lifting:  Breathe in before starting the exercise.  Breathe out during the hardest part of the exercise (such as raising the weights). Where to find support You can find support for exercising with COPD from:  Your health care provider.  A pulmonary rehabilitation program.  Your local health department or community health programs.  Support groups, online or in-person. Your health care provider may be able to recommend support groups. Where to find more information You can find more information about exercising with COPD from:  American Lung Association: [employer].  COPD Foundation: https://www.rivera.net/. Contact a health care provider if:  Your symptoms get  worse.  You have chest pain.  You have nausea.  You have a fever.  You have trouble talking or catching your breath.  You want to start a new exercise program or a new activity. Summary  COPD is a general term that can be used to describe many different lung problems that cause lung swelling (inflammation) and limit airflow. This includes chronic bronchitis and emphysema.  Exercise and physical activity improve your shortness of breath by increasing blood flow (circulation). This causes your heart to provide more oxygen to your body.  Contact your health care provider before starting any exercise program or new activity. Ask your health care provider what exercises and activities are safe for you. This information is not intended to replace advice given to you by your health care provider. Make sure you discuss any questions you have with your health care provider. Document Revised: 03/15/2019 Document Reviewed: 12/16/2017 Elsevier Patient Education  2021 [employer].

## 2021-05-06 LAB — HEMOGLOBIN A1C
Hgb A1c MFr Bld: 6.7 % — ABNORMAL HIGH (ref 4.8–5.6)
Mean Plasma Glucose: 146 mg/dL

## 2021-05-06 NOTE — Discharge Summary (Signed)
Physician Discharge Summary  George [name] GEZ:[id number] DOB: 1941/01/03 DOA: 05/01/2021  PCP: [facility]  Admit date: 05/01/2021 Discharge date: 05/05/2021  Time spent: 35 minutes  Recommendations for Outpatient Follow-up:  1. PCP in 1 week, follow-up hemoglobin A1c 2. Oncology Dr. 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 -He reports going home after his chemo treatment on 5/25, after reaching home he started experiencing dyspnea which is rather acute in onset, with mild cough and wheeze, denies any fevers or chills. -He presented to the ED, was noted to be mildly hypoxic with activity, CTA chest was negative for pulmonary embolismapproximately 5 x 4 x 5 cm, right upper lobe mass with associated anterior chest wall extension with limited evaluation on this study due to timing of contrast.Narrowing and obstruction of the distal associated bronchial. 3. Interval increase in cortical erosion/destruction of the rightanterior third rib along the lung mass. -Improving with treatment for COPD exacerbation, complicated by severe hyperglycemia  Hospital Course:   COPD exacerbation -CTA negative for PE, notable for lower right upper lobe mass with anterior chest wall extension -Improved with IV steroids and nebs, transition to prednisone taper quickly due to severe hyperglycemia -Long-term smoker, counseled regarding  smoking cessation, upon ambulation his O2 sats briefly dropped below 88% hence set up with 2 L home O2 at discharge -Follow-up with PCP in 1 week  Severe hyperglycemia -CBGs went up to 400s on admission in the setting of IV steroids, this was tapered down to low-dose prednisone quickly with improvement in blood sugars, required Lantus and NovoLog for first 2 days -CBGs in the 100s now, discharged home without insulin -Patient denies history of diabetes, hemoglobin A1c is pending at discharge  Stage III non-small cell lung cancer -Currently on chemotherapy, followed by Dr. Earlie Server -CT as noted above -Severely malnourished with advanced COPD as well, recommend goals of care and CODE STATUS discussions at follow-up  Severe protein calorie malnutrition -Added supplements  Hypothyroidism -Continue Synthroid  Chronic anemia -Stable Discharge Exam: Vitals:   05/05/21 1100 05/05/21 1315  BP:  123/81  Pulse:  93  Resp:  18  Temp:  (!) 97.4 F (36.3 C)  SpO2: 99% (!) 89%    General: AAOx2, cognitive deficits noted Cardiovascular: S1S2/RRR Respiratory: Poor air movement, no wheezes today  Discharge Instructions   Discharge Instructions    Diet - low sodium heart healthy   Complete by: As directed    Increase activity slowly   Complete by: As directed      Allergies as of 05/05/2021      Reactions   Paclitaxel Shortness Of Breath   Aspirin    Stomach upset      Medication List    STOP taking these medications   HYDROcodone-homatropine 5-1.5 MG/5ML syrup Commonly known as: Hycodan   sucralfate 1 g tablet Commonly known as: Carafate     TAKE these medications   acetaminophen 325 MG tablet Commonly known as: Tylenol Take 2 tablets (  650 mg total) by mouth every 4 (four) hours as needed for mild pain, moderate pain or fever.   albuterol 108 (90 Base) MCG/ACT inhaler Commonly known as: VENTOLIN HFA Inhale 1 puff into the lungs every 6 (six) hours as needed for  wheezing or shortness of breath.   levothyroxine 75 MCG tablet Commonly known as: SYNTHROID Take 1 tablet (75 mcg total) by mouth daily before breakfast.   potassium chloride SA 20 MEQ tablet Commonly known as: KLOR-CON Take 1 tablet (20 mEq total) by mouth daily.   predniSONE 10 MG tablet Commonly known as: DELTASONE Take 1 tablet (10 mg total) by mouth daily with breakfast.   prochlorperazine 10 MG tablet Commonly known as: COMPAZINE Take 1 tablet (10 mg total) by mouth every 6 (six) hours as needed.      Allergies  Allergen Reactions  . Paclitaxel Shortness Of Breath  . Aspirin     Stomach upset    Follow-up Information    [facility].   Specialty: Home Health Services Contact information: [place] [address] [phone]                The results of significant diagnostics from this hospitalization (including imaging, microbiology, ancillary and laboratory) are listed below for reference.    Significant Diagnostic Studies: DG Chest 2 View  Result Date: 05/01/2021 CLINICAL DATA:  Dyspnea EXAM: CHEST - 2 VIEW COMPARISON:  05/13/2020 FINDINGS: The lungs are hyperinflated in keeping with changes of underlying COPD. Right upper lobe anteromedial pulmonary mass is again identified, better visualized on prior CT examination of 03/18/2021. This appears stable since prior CT examination. No new focal pulmonary infiltrate. No pneumothorax. Tiny right pleural effusion is unchanged. Cardiac size within normal limits. Remote T9 compression fracture again noted. IMPRESSION: Stable anteromedial right upper lobe pulmonary mass, better assessed on prior CT examination of 03/18/2021. Unchanged small associated right pleural effusion. COPD Electronically Signed   By: Fidela Salisbury MD   On: 05/01/2021 20:50   CT Angio Chest PE W and/or Wo Contrast  Result Date: 05/02/2021 CLINICAL DATA:  Received chemo treatment today, after  which he experienced SOB, called EMS, satted 90% on RA. EXAM: CT ANGIOGRAPHY CHEST WITH CONTRAST TECHNIQUE: Multidetector CT imaging of the chest was performed using the standard protocol during bolus administration of intravenous contrast. Multiplanar CT image reconstructions and MIPs were obtained to evaluate the vascular anatomy. CONTRAST:  38mL OMNIPAQUE IOHEXOL 350 MG/ML SOLN COMPARISON:  None. FINDINGS: Cardiovascular: Satisfactory opacification of the pulmonary arteries to the segmental level. No evidence of pulmonary embolism. Normal heart size. No significant pericardial effusion. The thoracic aorta is normal in caliber. Severe calcified and noncalcified. Atherosclerotic plaque of the thoracic aorta. Four-vessel coronary artery calcifications. Mediastinum/Nodes: Right hilar and mediastinal lymphadenopathy difficult to measure on this study due to timing of contrast (9:133). No axillary lymph nodes. Thyroid gland, trachea, and esophagus demonstrate no significant findings. Lungs/Pleura: A 7 S changes. Redemonstration of a spiculated, poorly defined, approximately 5 x 4 x 5 cm, right upper lobe mass with associated anterior chest wall extension with limited evaluation on this study due to timing of contrast. Narrowing and obstruction of the distal associated bronchial (10:64). Left lower lobe pulmonary micronodule (6:98). Interval increase in trace right pleural effusion. No left pleural effusion. No pneumothorax. Upper Abdomen: No acute abnormality with limited evaluation due to timing of contrast. Musculoskeletal: Chronic T9 compression fracture with greater than 85% height loss. Chronic T3  compression fracture with greater than 15% height loss. Nondisplaced chronic sternal fracture. Interval increase in cortical erosion and destruction of the right anterior third rib along the mass lesion (9:122). Review of the MIP images confirms the above findings. IMPRESSION: 1. No pulmonary embolus. 2. Spiculated,  poorly defined, approximately 5 x 4 x 5 cm, right upper lobe mass with associated anterior chest wall extension with limited evaluation on this study due to timing of contrast. Narrowing and obstruction of the distal associated bronchial. 3. Interval increase in cortical erosion/destruction of the right anterior third rib along the lung mass. 4. Interval increase of trace volume right pleural effusion. 5. Chronic T3 and T9 compression fractures. 6. Aortic Atherosclerosis (ICD10-I70.0) and Emphysema (ICD10-J43.9). Electronically Signed   By: Iven Finn M.D.   On: 05/02/2021 00:56    Microbiology: Recent Results (from the past 240 hour(s))  Resp Panel by RT-PCR (Flu A&B, Covid) Nasopharyngeal Swab     Status: None   Collection Time: 05/02/21  6:26 AM   Specimen: Nasopharyngeal Swab; Nasopharyngeal(NP) swabs in vial transport medium  Result Value Ref Range Status   SARS Coronavirus 2 by RT PCR NEGATIVE NEGATIVE Final    Comment: (NOTE) SARS-CoV-2 target nucleic acids are NOT DETECTED.  The SARS-CoV-2 RNA is generally detectable in upper respiratory specimens during the acute phase of infection. The lowest concentration of SARS-CoV-2 viral copies this assay can detect is 138 copies/mL. A negative result does not preclude SARS-Cov-2 infection and should not be used as the sole basis for treatment or other patient management decisions. A negative result may occur with  improper specimen collection/handling, submission of specimen other than nasopharyngeal swab, presence of viral mutation(s) within the areas targeted by this assay, and inadequate number of viral copies(<138 copies/mL). A negative result must be combined with clinical observations, patient history, and epidemiological information. The expected result is Negative.  Fact Sheet for Patients:  EntrepreneurPulse.com.au  Fact Sheet for Healthcare Providers:  IncredibleEmployment.be  This test  is no t yet approved or cleared by the [place] FDA and  has been authorized for detection and/or diagnosis of SARS-CoV-2 by FDA under an Emergency Use Authorization (EUA). This EUA will remain  in effect (meaning this test can be used) for the duration of the COVID-19 declaration under Section 564(b)(1) of the Act, 21 U.S.C.section 360bbb-3(b)(1), unless the authorization is terminated  or revoked sooner.       Influenza A by PCR NEGATIVE NEGATIVE Final   Influenza B by PCR NEGATIVE NEGATIVE Final    Comment: (NOTE) The Xpert Xpress SARS-CoV-2/FLU/RSV plus assay is intended as an aid in the diagnosis of influenza from Nasopharyngeal swab specimens and should not be used as a sole basis for treatment. Nasal washings and aspirates are unacceptable for Xpert Xpress SARS-CoV-2/FLU/RSV testing.  Fact Sheet for Patients: EntrepreneurPulse.com.au  Fact Sheet for Healthcare Providers: IncredibleEmployment.be  This test is not yet approved or cleared by the [place] FDA and has been authorized for detection and/or diagnosis of SARS-CoV-2 by FDA under an Emergency Use Authorization (EUA). This EUA will remain in effect (meaning this test can be used) for the duration of the COVID-19 declaration under Section 564(b)(1) of the Act, 21 U.S.C. section 360bbb-3(b)(1), unless the authorization is terminated or revoked.  Performed at [facility], [place] [address], [place], [place] [address]      Labs: Basic Metabolic Panel: Recent Labs  Lab 05/01/21 [id number] 05/01/21 2142 05/02/21 1634 05/03/21 5993 05/04/21 5701 05/05/21 7793  NA 144 143  --  138 136 138  K 3.0* 4.0  --  4.4 4.5 4.9  CL 103 101  --  99 100 99  CO2 31 32  --  33* 29 32  GLUCOSE 141* 186*  --  289* 294* 101*  BUN 9 11  --  13 17 18   CREATININE 1.07 1.07 0.85 0.91 1.08 0.87  CALCIUM 8.9 9.0  --  8.9 9.0 9.8   Liver Function Tests: Recent Labs  Lab  05/01/21 0924 05/01/21 2142 05/03/21 0447  AST 12* 19 16  ALT 6 8 8   ALKPHOS 60 57 51  BILITOT 0.5 0.7 0.2*  PROT 6.9 7.6 6.8  ALBUMIN 2.9* 3.3* 3.0*   No results for input(s): LIPASE, AMYLASE in the last 168 hours. No results for input(s): AMMONIA in the last 168 hours. CBC: Recent Labs  Lab 05/01/21 0924 05/01/21 2142 05/02/21 1634 05/03/21 0447 05/05/21 0824  WBC 3.1* 4.3 3.3* 3.5* 5.7  NEUTROABS 1.7 3.4  --   --   --   HGB 11.3* 11.5* 11.4* 10.3* 12.0*  HCT 35.2* 37.6* 37.2* 34.3* 39.1  MCV 97.5 101.1* 100.3* 99.4 99.2  PLT 144* 138* 143* 121* 142*   Cardiac Enzymes: No results for input(s): CKTOTAL, CKMB, CKMBINDEX, TROPONINI in the last 168 hours. BNP: BNP (last 3 results) Recent Labs    05/13/20 1440  BNP 85.7    ProBNP (last 3 results) No results for input(s): PROBNP in the last 8760 hours.  CBG: Recent Labs  Lab 05/04/21 1733 05/04/21 2209 05/05/21 0731 05/05/21 1146 05/05/21 1641  GLUCAP 107* 124* 111* 104* 79       Signed:  Domenic Polite MD.  [facility] 05/06/2021, 2:42 PM

## 2021-05-20 ENCOUNTER — Ambulatory Visit (HOSPITAL_COMMUNITY): Admission: RE | Admit: 2021-05-20 | Payer: No Typology Code available for payment source | Source: Ambulatory Visit

## 2021-05-22 ENCOUNTER — Inpatient Hospital Stay: Payer: No Typology Code available for payment source

## 2021-05-22 ENCOUNTER — Inpatient Hospital Stay: Payer: No Typology Code available for payment source | Admitting: Internal Medicine

## 2021-05-22 ENCOUNTER — Telehealth: Payer: Self-pay | Admitting: Medical Oncology

## 2021-05-22 NOTE — Telephone Encounter (Signed)
[facility] said pt declines further scans and treatments and [facility].  Mohamed notified.

## 2021-06-12 ENCOUNTER — Other Ambulatory Visit: Payer: No Typology Code available for payment source

## 2021-06-12 ENCOUNTER — Ambulatory Visit: Payer: No Typology Code available for payment source

## 2021-06-12 ENCOUNTER — Ambulatory Visit: Payer: No Typology Code available for payment source | Admitting: Internal Medicine

## 2021-08-07 DEATH — deceased

## 2022-06-29 ENCOUNTER — Other Ambulatory Visit: Payer: Self-pay
